# Patient Record
Sex: Male | Born: 1940 | Race: White | Hispanic: No | Marital: Married | Smoking: Former smoker
Health system: Southern US, Community
[De-identification: ages and names within clinical notes are randomized; demographics above are authoritative.]

## PROBLEM LIST (undated history)

## (undated) DIAGNOSIS — R3911 Hesitancy of micturition: Secondary | ICD-10-CM

## (undated) DIAGNOSIS — I739 Peripheral vascular disease, unspecified: Secondary | ICD-10-CM

## (undated) DIAGNOSIS — IMO0001 Reserved for inherently not codable concepts without codable children: Secondary | ICD-10-CM

## (undated) DIAGNOSIS — K635 Polyp of colon: Secondary | ICD-10-CM

## (undated) DIAGNOSIS — I779 Disorder of arteries and arterioles, unspecified: Secondary | ICD-10-CM

## (undated) DIAGNOSIS — E785 Hyperlipidemia, unspecified: Secondary | ICD-10-CM

## (undated) DIAGNOSIS — C7A8 Other malignant neuroendocrine tumors: Secondary | ICD-10-CM

## (undated) DIAGNOSIS — J449 Chronic obstructive pulmonary disease, unspecified: Secondary | ICD-10-CM

## (undated) DIAGNOSIS — R350 Frequency of micturition: Secondary | ICD-10-CM

## (undated) DIAGNOSIS — C801 Malignant (primary) neoplasm, unspecified: Secondary | ICD-10-CM

## (undated) HISTORY — DX: Reserved for inherently not codable concepts without codable children: IMO0001

## (undated) HISTORY — PX: TONSILLECTOMY: SHX5217

## (undated) HISTORY — DX: Disorder of arteries and arterioles, unspecified: I77.9

## (undated) HISTORY — PX: UPPER GASTROINTESTINAL ENDOSCOPY: SHX188

## (undated) HISTORY — DX: Polyp of colon: K63.5

## (undated) HISTORY — PX: POLYPECTOMY: SHX149

## (undated) HISTORY — PX: CATARACT EXTRACTION, BILATERAL: SHX1313

## (undated) HISTORY — DX: Peripheral vascular disease, unspecified: I73.9

## (undated) HISTORY — PX: COLONOSCOPY: SHX174

## (undated) HISTORY — DX: Other malignant neuroendocrine tumors: C7A.8

## (undated) HISTORY — PX: ESOPHAGOGASTRODUODENOSCOPY: SHX1529

## (undated) HISTORY — PX: HERNIA REPAIR: SHX51

---

## 2003-03-16 ENCOUNTER — Encounter: Admission: RE | Admit: 2003-03-16 | Discharge: 2003-03-16 | Payer: Self-pay | Admitting: Internal Medicine

## 2007-01-12 ENCOUNTER — Emergency Department (HOSPITAL_COMMUNITY): Admission: EM | Admit: 2007-01-12 | Discharge: 2007-01-12 | Payer: Self-pay | Admitting: Family Medicine

## 2010-11-29 LAB — DIFFERENTIAL
Basophils Relative: 0
Lymphocytes Relative: 16
Monocytes Relative: 9
Neutro Abs: 7.5
Neutrophils Relative %: 74

## 2010-11-29 LAB — I-STAT 8, (EC8 V) (CONVERTED LAB)
Bicarbonate: 23.3
Chloride: 108
HCT: 49
Hemoglobin: 16.7
Operator id: 265201
Sodium: 140
pCO2, Ven: 35 — ABNORMAL LOW

## 2010-11-29 LAB — CBC
MCHC: 34.6
RBC: 5.18
WBC: 10.1

## 2010-11-29 LAB — POCT I-STAT CREATININE: Creatinine, Ser: 0.8

## 2011-06-28 ENCOUNTER — Telehealth: Payer: Self-pay | Admitting: Internal Medicine

## 2011-06-28 NOTE — Telephone Encounter (Signed)
Yep. OK.

## 2011-06-28 NOTE — Telephone Encounter (Signed)
Mrs. Thone called to set up an appointment for [facility].  She thinks he saw you at [facility] many years ago.  Will this be ok?

## 2011-06-29 NOTE — Telephone Encounter (Signed)
Pt is aware and has an appointment for May 29.

## 2011-07-19 ENCOUNTER — Ambulatory Visit (INDEPENDENT_AMBULATORY_CARE_PROVIDER_SITE_OTHER): Payer: Medicare Other | Admitting: Internal Medicine

## 2011-07-19 ENCOUNTER — Other Ambulatory Visit (INDEPENDENT_AMBULATORY_CARE_PROVIDER_SITE_OTHER): Payer: Medicare Other

## 2011-07-19 ENCOUNTER — Encounter: Payer: Self-pay | Admitting: Internal Medicine

## 2011-07-19 VITALS — BP 124/70 | HR 60 | Temp 97.5°F | Resp 16 | Wt 137.0 lb

## 2011-07-19 DIAGNOSIS — R0602 Shortness of breath: Secondary | ICD-10-CM

## 2011-07-19 DIAGNOSIS — Z0000 Encounter for general adult medical examination without abnormal findings: Secondary | ICD-10-CM

## 2011-07-19 DIAGNOSIS — Z87891 Personal history of nicotine dependence: Secondary | ICD-10-CM

## 2011-07-19 DIAGNOSIS — Z23 Encounter for immunization: Secondary | ICD-10-CM

## 2011-07-19 LAB — CBC WITH DIFFERENTIAL/PLATELET
Eosinophils Absolute: 0.1 10*3/uL (ref 0.0–0.7)
MCHC: 33.6 g/dL (ref 30.0–36.0)
MCV: 88.6 fl (ref 78.0–100.0)
Monocytes Absolute: 0.6 10*3/uL (ref 0.1–1.0)
Neutrophils Relative %: 75.1 % (ref 43.0–77.0)
Platelets: 236 10*3/uL (ref 150.0–400.0)
WBC: 9.9 10*3/uL (ref 4.5–10.5)

## 2011-07-19 LAB — COMPREHENSIVE METABOLIC PANEL
BUN: 8 mg/dL (ref 6–23)
CO2: 29 mEq/L (ref 19–32)
Calcium: 9.1 mg/dL (ref 8.4–10.5)
Chloride: 105 mEq/L (ref 96–112)
Creatinine, Ser: 0.8 mg/dL (ref 0.4–1.5)
GFR: 103.94 mL/min (ref >60.00)

## 2011-07-19 LAB — HEPATIC FUNCTION PANEL
Bilirubin, Direct: 0.1 mg/dL (ref 0.0–0.3)
Total Bilirubin: 0.6 mg/dL (ref 0.3–1.2)
Total Protein: 7.2 g/dL (ref 6.0–8.3)

## 2011-07-19 LAB — LIPID PANEL
Cholesterol: 187 mg/dL (ref 0–200)
Triglycerides: 138 mg/dL (ref 0.0–149.0)

## 2011-07-19 NOTE — Patient Instructions (Signed)
Thanks for coming to see me.  Will check routine labs today. Will get a non-contrast CT scan of the chest to see if there is emphysema ( very likely) or anything else.

## 2011-07-19 NOTE — Progress Notes (Signed)
Subjective:    Patient ID: Joseph Parker, male    DOB: 10/19/1940, 70 y.o.   MRN: [id number]  HPI Joseph Parker has not been seen since 2005 - old note pulled from archives for review. He presents at the urging of his family: he has had thinning about the head and neck, he has increased SOB, decreased stamina. He will have a cough every AM and when he rises from supine to sitting or standing. No night sweats or hard chills, no other signs of illness.   Past Medical History  Diagnosis Date  . Chickenpox   . Colon polyps    Past Surgical History  Procedure Date  . Tonsillectomy     remote - childhood  . Hernia repair     right inquinal - as child   Family History  Problem Relation Age of Onset  . Heart disease Mother     CABG; Valve replacement - died post-op  . Alzheimer's disease Father   . Diabetes Father   . Diabetes Son    History   Social History  . Marital Status: Married    Spouse Name: N/A    Number of Children: 5  . Years of Education: 12   Occupational History  . mfg supervisor     retired   Social History Main Topics  . Smoking status: Former Smoker    Quit date: 07/20/1999  . Smokeless tobacco: Never Used  . Alcohol Use: No  . Drug Use: No  . Sexually Active: Not on file   Other Topics Concern  . Not on file   Social History Narrative   HSG. Married '62. Previous marriage - 3 years/divorce. 5 sons. 6 grandchildren. Work - Proofreader - retired. Active in community and in his church       Review of Systems Constitutional:  Negative for fever, chills, activity change and unexpected weight change.  HEENT:  Negative for hearing loss, ear pain, congestion, neck stiffness and postnasal drip. Negative for sore throat or swallowing problems. Negative for dental complaints.   Eyes: Negative for vision loss or change in visual acuity.  Respiratory: Negative for chest tightness and wheezing. Negative for DOE.   Cardiovascular: Negative for chest  pain or palpitations. No decreased exercise tolerance Gastrointestinal: No change in bowel habit. No bloating or gas. No reflux or indigestion Genitourinary: Negative for urgency, frequency, flank pain and difficulty urinating.  Musculoskeletal: Negative for myalgias, back pain, arthralgias and gait problem.  Neurological: Negative for dizziness, tremors, weakness and headaches.  Hematological: Negative for adenopathy.  Psychiatric/Behavioral: Negative for behavioral problems and dysphoric mood.       Objective:   Physical Exam Filed Vitals:   07/19/11 1003  BP: 124/70  Pulse: 60  Temp: 97.5 F (36.4 C)  Resp: 16  Weight: 137 lb (62.143 kg)   Gen'l- thin white man in no distress HEENT- temporal wasting, C&S clear, PERRLA, edentulous with dentures in place, throat clear Neck- supple, w/o thyromegaly Nodes - negative submandibular, cervical or supraclavicular region Cor- RRR Pulm - normal respirations, lungs clear to AP, no rales or wheezes Abd- scaphoid, BS +, no HSM Genitalia - deferred Neuro - A&O x 3, CN II-XII normal, MS - normal Derm - no visible skin lesions.  Lab Results  Component Value Date   WBC 9.9 07/19/2011   HGB 14.9 07/19/2011   HCT 44.3 07/19/2011   PLT 236.0 07/19/2011   GLUCOSE 100* 07/19/2011   CHOL 187 07/19/2011   TRIG  138.0 07/19/2011   HDL 39.00* 07/19/2011   LDLCALC 120* 07/19/2011        ALT 11 07/19/2011   AST 14 07/19/2011        NA 141 07/19/2011   K 4.3 07/19/2011   CL 105 07/19/2011   CREATININE 0.8 07/19/2011   BUN 8 07/19/2011   CO2 29 07/19/2011   TSH 1.45 07/19/2011          Assessment & Plan:

## 2011-07-20 ENCOUNTER — Encounter: Payer: Self-pay | Admitting: Internal Medicine

## 2011-07-20 ENCOUNTER — Telehealth: Payer: Self-pay

## 2011-07-20 DIAGNOSIS — J439 Emphysema, unspecified: Secondary | ICD-10-CM | POA: Insufficient documentation

## 2011-07-20 DIAGNOSIS — Z0000 Encounter for general adult medical examination without abnormal findings: Secondary | ICD-10-CM | POA: Insufficient documentation

## 2011-07-20 NOTE — Assessment & Plan Note (Signed)
Interval medical history from '05 - benign. Physical exam is normal except for thinning. Lab results are in normal limits. Immunizations are brought up to date. He has had colorectal cancer screening.  In summary - a nice man who is generally healthy now with  Mild pulmonary symptoms. He is active, mindful of his diet and health conscious.

## 2011-07-20 NOTE — Assessment & Plan Note (Signed)
Mr. Joseph Parker with mild weight loss of about 5 lbs but noticeable thinning about the head, neck and upper chest.  He has mild SOB and mild decreased exercise tolerance. 45 pack year smoking history. At risk for pulmonary malignancy, also at risk for emphysema.  Plan-    Low dose CT chest w/o contrast to r/o lung cancer in a former smoker  May need to PFTs for better diagnosis if CT negative.

## 2011-07-20 NOTE — Telephone Encounter (Signed)
Pt's spouse called requesting results of labs done yesterday, please advise.

## 2011-07-21 NOTE — Telephone Encounter (Signed)
Labs were all normal. Full report will be mailed

## 2011-07-21 NOTE — Telephone Encounter (Signed)
Pt's spouse informed 

## 2011-07-21 NOTE — Telephone Encounter (Signed)
Left message on machine for pt to return my call  

## 2011-07-31 ENCOUNTER — Telehealth: Payer: Self-pay | Admitting: *Deleted

## 2011-07-31 NOTE — Telephone Encounter (Signed)
Pt's spouse called left message asking for return call-pt's spouse is inquiring on status of CT scan-she has received approval for CT in the [facility] to [facility] for information.

## 2011-08-03 ENCOUNTER — Other Ambulatory Visit: Payer: Medicare Other

## 2011-08-03 ENCOUNTER — Ambulatory Visit (INDEPENDENT_AMBULATORY_CARE_PROVIDER_SITE_OTHER)
Admission: RE | Admit: 2011-08-03 | Discharge: 2011-08-03 | Disposition: A | Discharge status: Home / Self Care | Payer: Medicare Other | Source: Ambulatory Visit | Attending: Internal Medicine | Admitting: Internal Medicine

## 2011-08-03 DIAGNOSIS — Z87891 Personal history of nicotine dependence: Secondary | ICD-10-CM

## 2011-08-03 DIAGNOSIS — R0602 Shortness of breath: Secondary | ICD-10-CM

## 2011-08-04 ENCOUNTER — Telehealth: Payer: Self-pay | Admitting: *Deleted

## 2011-08-04 ENCOUNTER — Telehealth: Payer: Self-pay | Admitting: Internal Medicine

## 2011-08-04 NOTE — Telephone Encounter (Signed)
Caller: Judy/Spouse; PCP: Illene Regulus; CB#: [phone];  Call regarding Awaiting Results of CT Scan of Chest that was done on 08/03/11. Left two messages today and has not recieved call back. Advised that Dr. Debby Bud is not in office today and he would have called if anything Critical. PLEASE CALL ON MONDAY 08/07/11.

## 2011-08-04 NOTE — Telephone Encounter (Signed)
Left msg on triage requesting CT results from yesterday. MD is out of office. Pls advise... 08/04/11@9 :12am/LMB

## 2011-08-04 NOTE — Telephone Encounter (Signed)
Will forward this to MEN 

## 2011-08-05 ENCOUNTER — Other Ambulatory Visit: Payer: Self-pay | Admitting: Internal Medicine

## 2011-08-05 DIAGNOSIS — R911 Solitary pulmonary nodule: Secondary | ICD-10-CM

## 2011-08-05 NOTE — Telephone Encounter (Signed)
Called and spoke with mrs. Pitstick. Will call back this afternoon to talk with Mr. Umbach

## 2011-08-14 ENCOUNTER — Encounter (HOSPITAL_COMMUNITY)
Admission: RE | Admit: 2011-08-14 | Discharge: 2011-08-14 | Disposition: A | Discharge status: Home / Self Care | Payer: Medicare Other | Source: Ambulatory Visit | Attending: Internal Medicine | Admitting: Internal Medicine

## 2011-08-14 DIAGNOSIS — R911 Solitary pulmonary nodule: Secondary | ICD-10-CM | POA: Insufficient documentation

## 2011-08-14 DIAGNOSIS — N200 Calculus of kidney: Secondary | ICD-10-CM | POA: Insufficient documentation

## 2011-08-14 DIAGNOSIS — J438 Other emphysema: Secondary | ICD-10-CM | POA: Insufficient documentation

## 2011-08-14 DIAGNOSIS — N400 Enlarged prostate without lower urinary tract symptoms: Secondary | ICD-10-CM | POA: Insufficient documentation

## 2011-08-14 MED ORDER — FLUDEOXYGLUCOSE F - 18 (FDG) INJECTION
19.3000 | Freq: Once | INTRAVENOUS | Status: AC | PRN
  Administered 2011-08-14: 19.3 via INTRAVENOUS

## 2011-08-15 ENCOUNTER — Telehealth: Payer: Self-pay

## 2011-08-15 NOTE — Telephone Encounter (Signed)
Nodule does NOT show any hypermetabolic uptake - does not look malignant but the caveat is that it is small and it is possible to be too early to diagnose. Will need f/u CT in 3-4 months

## 2011-08-15 NOTE — Telephone Encounter (Signed)
Spouse called requesting PET scan results, please advise.

## 2011-08-16 NOTE — Telephone Encounter (Signed)
Results of PET scan results given to spouse,Judy,  Informed of need to have CT  In 3 - 4 months

## 2011-08-18 ENCOUNTER — Telehealth: Payer: Self-pay

## 2011-08-18 DIAGNOSIS — R0602 Shortness of breath: Secondary | ICD-10-CM

## 2011-08-18 NOTE — Telephone Encounter (Signed)
Pt's spouse called requesting advisement from MEN regarding pt's persistent cough, please advise.

## 2011-08-21 MED ORDER — BENZONATATE 100 MG PO CAPS: 100.0000 mg | ORAL_CAPSULE | Freq: Three times a day (TID) | ORAL | Status: AC | PRN

## 2011-08-21 NOTE — Telephone Encounter (Signed)
Pt's spouse advised of OTC medication, Rx/pharmacy as well as PFT testing.

## 2011-08-21 NOTE — Telephone Encounter (Signed)
Will schedule for ;pulmonary function testing  For cough: take otc Prilosec at AM. Reflux can be a cause of cough. Trial of tessalon perles tid - Rx sent in.

## 2011-10-25 ENCOUNTER — Encounter: Payer: Self-pay | Admitting: Internal Medicine

## 2011-10-25 ENCOUNTER — Ambulatory Visit (INDEPENDENT_AMBULATORY_CARE_PROVIDER_SITE_OTHER): Payer: Medicare Other | Admitting: Internal Medicine

## 2011-10-25 VITALS — BP 112/72 | HR 66 | Temp 97.3°F | Resp 16 | Wt 134.0 lb

## 2011-10-25 DIAGNOSIS — J438 Other emphysema: Secondary | ICD-10-CM

## 2011-10-25 DIAGNOSIS — R911 Solitary pulmonary nodule: Secondary | ICD-10-CM

## 2011-10-25 DIAGNOSIS — J439 Emphysema, unspecified: Secondary | ICD-10-CM

## 2011-10-25 NOTE — Patient Instructions (Addendum)
Lung nodule - this was a spiculated but very small nodule right posterior lung that was negative on PET scan. Plan is for a follow-up CT scan.  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 In order to establish a  Baseline and to better know the extent of the problem I do recommend you have pulmonary function studies.   Chronic Obstructive Pulmonary Disease Exacerbation  Chronic obstructive pulmonary disease (COPD) is a lung disease. The lungs become damaged, making it hard to get air in and out of your lungs. COPD exacerbation means that your COPD has gotten worse. If you do not get help, this can be a life-threatening problem. HOME CARE  Do not smoke.   Avoid tobacco smoke and other things that bother your lungs.   If given, take your antibiotic medicine as told. Finish the medicine even if you start to feel better.   Only take medicines as told by your doctor.   Drink enough fluids to keep your pee (urine) clear or pale yellow.   Use a cool mist machine (vaporizer).   If you use oxygen or a machine that turns liquid medicine into a mist (nebulizer), continue to use them as told.   Keep up with shots (vaccinations) as told by your doctor.   Exercise regularly.   Eat healthy foods.   Keep all doctor visits as told.  GET HELP RIGHT AWAY IF:  You are very short of breath.   You have trouble talking.   You have bad chest pain.   You have blood in your spit (sputum).   You have a fever, or you keep throwing up (vomiting).   You feel weak, or you pass out (faint).   You feel confused.   You keep getting worse.  MAKE SURE YOU:    Understand these instructions.   Will watch your condition.   Will get help right away if you are not doing well or get worse.  Document Released: 01/26/2011 Document Reviewed: 01/24/2011 [facility] Patient Information 2012 [place], [place].   Chronic Obstructive Pulmonary Disease Exacerbation Chronic  obstructive pulmonary disease (COPD) is a condition that limits airflow. COPD may include chronic bronchitis, pulmonary emphysema, or both. A COPD exacerbation means that your COPD has gotten worse. Without treatment, this can be a life-threatening problem. COPD exacerbation requires immediate medical care. CAUSES   COPD exacerbation can be caused by:  Exposure to smoke.   Exposure to air pollution, chemical fumes, or dust.   Respiratory infections.   Genetics, particularly alpha 1-antitrypsin deficiency.   A condition in which the body's immune system attacks itself (autoimmunity).  SYMPTOMS    Increased coughing.   Increased wheezing.   Increased shortness of breath.   Swelling due to a buildup of fluid (peripheral edema) related to heart strain.   Rapid breathing.   Chest enlargement (barrel chest).   Chest tightness.  DIAGNOSIS   There is no single test that can diagnosis COPD exacerbation. Your history, physical exam, and other tests will help your caregiver make a diagnosis. Tests may include a chest X-ray, pulmonary function tests, spirometry, basic lab tests, and an arterial blood gas test. TREATMENT   Severe problems may require a stay in the hospital. Depending on the cause of your problems, the following may be prescribed:  Antibiotic medicines.   Bronchodilators (inhaled or tablets).   Cortisone medicines (inhaled or tablets).   Supplemental oxygen therapy.   Pulmonary rehabilitation. This is a broad program that  may involve exercise, nutrition counseling, breathing techniques, and further education about your condition.  It is important to use good technique with inhaled medicines. Spacer devices may be needed to help improve drug delivery. HOME CARE INSTRUCTIONS    Do not smoke. Quitting smoking is very important to prevent worsening of COPD.   Avoid exposure to all substances that irritate the airway, especially tobacco smoke.   If prescribed, take your  antibiotics as directed. Finish them even if you start to feel better.   Only take over-the-counter or prescription medicines as directed by your caregiver.   Drink enough fluids to keep your urine clear or pale yellow. This can help thin bronchial secretions.   Use a cool mist vaporizer. This makes it easier to clear your chest when you cough.   If you have a home nebulizer and oxygen, continue to use them as directed.   Maintain all necessary vaccinations to prevent infections.   Exercise regularly.   Eat a healthy diet.   Keep all follow-up appointments as directed by your caregiver.  SEEK IMMEDIATE MEDICAL CARE IF:  You have extreme shortness of breath.   You have severe chest pain or blood in your sputum.   You have a high fever, weakness, repeated vomiting, or fainting.   You feel confused.  MAKE SURE YOU:    Understand these instructions.   Will watch your condition.   Will get help right away if you are not doing well or get worse.  Document Released: 12/04/2006 Document Revised: 01/26/2011 Document Reviewed: 10/04/2010 [facility] Patient Information 2012 [place], [place].

## 2011-10-27 NOTE — Progress Notes (Signed)
  Subjective:    Patient ID: Joseph Parker, male    DOB: January 09, 1941, 71 y.o.   MRN: [id number]  HPI Joseph Parker presents for follow up of a pulmonary nodule seen on CT chest: small spiculated lesion posterior RLL. Study also revealed emphysematous changes. In the interval he has been doing well. He denies any pulmonary symptoms, i.e. SOB, DOE. He has had no night sweats, weight loss, or lymphadenopathy. He did not have PFTs as ordered.  Past Medical History  Diagnosis Date  . Chickenpox   . Colon polyps    Past Surgical History  Procedure Date  . Tonsillectomy     remote - childhood  . Hernia repair     right inquinal - as child   Family History  Problem Relation Age of Onset  . Heart disease Mother     CABG; Valve replacement - died post-op  . Alzheimer's disease Father   . Diabetes Father   . Diabetes Son    History   Social History  . Marital Status: Married    Spouse Name: N/A    Number of Children: 5  . Years of Education: 12   Occupational History  . mfg supervisor     retired   Social History Main Topics  . Smoking status: Former Smoker    Quit date: 07/20/1999  . Smokeless tobacco: Never Used  . Alcohol Use: No  . Drug Use: No  . Sexually Active: Not on file   Other Topics Concern  . Not on file   Social History Narrative   HSG. Married '62. Previous marriage - 3 years/divorce. 5 sons. 6 grandchildren. Work - Proofreader - retired. Active in community and in his church    Current Outpatient Prescriptions on File Prior to Visit  Medication Sig Dispense Refill  . Omeprazole Magnesium (PRILOSEC OTC PO) Take 1 tablet by mouth daily.          Review of Systems System review is negative for any constitutional, cardiac, pulmonary, GI or neuro symptoms or complaints other than as described in the HPI.     Objective:   Physical Exam Filed Vitals:   10/25/11 1116  BP: 112/72  Pulse: 66  Temp: 97.3 F (36.3 C)  Resp: 16   Wt Readings from  Last 3 Encounters:  10/25/11 134 lb (60.782 kg)  07/19/11 137 lb (62.143 kg)   Gen'l- slender, tanned healthy appearing white man in no distress Pulm - normal respirations.       Assessment & Plan:

## 2011-10-27 NOTE — Assessment & Plan Note (Signed)
Reviewed CT chest report and images with the patient. Educated him as to the mechanism of disease - emphysema. Stressed the importance of establishing a baseline of function, i.e. PFTs, even though he is asymptomatic by his report.  Plan Rescheduled PFTs  (greater than 50% of 60 minute visit spent on education and counseling)

## 2011-10-27 NOTE — Assessment & Plan Note (Signed)
Joseph Parker has been asymptomatic.  Plan - f/u CT chest w/o contrast.

## 2011-10-30 ENCOUNTER — Other Ambulatory Visit: Payer: Medicare Other

## 2011-10-31 ENCOUNTER — Ambulatory Visit (INDEPENDENT_AMBULATORY_CARE_PROVIDER_SITE_OTHER)
Admission: RE | Admit: 2011-10-31 | Discharge: 2011-10-31 | Disposition: A | Discharge status: Home / Self Care | Payer: Medicare Other | Source: Ambulatory Visit | Attending: Internal Medicine | Admitting: Internal Medicine

## 2011-10-31 DIAGNOSIS — R911 Solitary pulmonary nodule: Secondary | ICD-10-CM

## 2011-11-02 ENCOUNTER — Ambulatory Visit (INDEPENDENT_AMBULATORY_CARE_PROVIDER_SITE_OTHER): Payer: Medicare Other | Admitting: Internal Medicine

## 2011-11-02 DIAGNOSIS — R0602 Shortness of breath: Secondary | ICD-10-CM

## 2011-11-02 LAB — PULMONARY FUNCTION TEST

## 2011-11-02 NOTE — Progress Notes (Signed)
PFT done today. 

## 2011-11-05 ENCOUNTER — Encounter: Payer: Self-pay | Admitting: Internal Medicine

## 2011-11-05 DIAGNOSIS — R911 Solitary pulmonary nodule: Secondary | ICD-10-CM

## 2011-11-06 ENCOUNTER — Telehealth: Payer: Self-pay | Admitting: Internal Medicine

## 2011-11-06 NOTE — Telephone Encounter (Signed)
Patients wife is calling to speak with Dr. Alvera Novel assistant about her husbands CT results

## 2011-11-07 NOTE — Telephone Encounter (Signed)
Called - negative study

## 2011-11-10 ENCOUNTER — Encounter: Payer: Self-pay | Admitting: Internal Medicine

## 2012-04-23 ENCOUNTER — Other Ambulatory Visit: Payer: Self-pay | Admitting: Internal Medicine

## 2012-04-26 ENCOUNTER — Other Ambulatory Visit: Payer: Medicare Other

## 2012-05-02 ENCOUNTER — Ambulatory Visit (INDEPENDENT_AMBULATORY_CARE_PROVIDER_SITE_OTHER)
Admission: RE | Admit: 2012-05-02 | Discharge: 2012-05-02 | Disposition: A | Discharge status: Home / Self Care | Payer: Medicare Other | Source: Ambulatory Visit | Attending: Internal Medicine | Admitting: Internal Medicine

## 2012-05-02 DIAGNOSIS — R911 Solitary pulmonary nodule: Secondary | ICD-10-CM

## 2012-05-06 ENCOUNTER — Encounter: Payer: Self-pay | Admitting: Internal Medicine

## 2013-03-17 ENCOUNTER — Ambulatory Visit (INDEPENDENT_AMBULATORY_CARE_PROVIDER_SITE_OTHER): Payer: Medicare Other | Admitting: Internal Medicine

## 2013-03-17 ENCOUNTER — Encounter: Payer: Self-pay | Admitting: Internal Medicine

## 2013-03-17 VITALS — BP 100/68 | HR 54 | Temp 96.8°F | Wt 130.0 lb

## 2013-03-17 DIAGNOSIS — R911 Solitary pulmonary nodule: Secondary | ICD-10-CM

## 2013-03-17 DIAGNOSIS — J439 Emphysema, unspecified: Secondary | ICD-10-CM

## 2013-03-17 DIAGNOSIS — J438 Other emphysema: Secondary | ICD-10-CM

## 2013-03-17 NOTE — Progress Notes (Signed)
Pre visit review using our clinic review tool, if applicable. No additional management support is needed unless otherwise documented below in the visit note. 

## 2013-03-17 NOTE — Patient Instructions (Signed)
Thanks for coming to see me and it has been a real pleasure being your doctor.  On exam there is no evidence of active infection or disease.  Reviewing you imaging studies and the pulmonary function studies you definitely have COPD. On imaging you have changes of emphysema  Plan Follow up CT of the chest to check on the nodules.

## 2013-03-17 NOTE — Progress Notes (Signed)
   Subjective:    Patient ID: Joseph Parker, male    DOB: Jul 31, 1940, 73 y.o.   MRN: [id number]  HPI Joseph Parker presents for h/o over the past two months of a chronic loose cough productive of a clear sputum. He has had no fever or chills, he denies any increase in shortness breath. He does have a h/o emphysema/COPD by CT chest and x-ray and COPD severe by PFTs. He does have a smoking history. He also has a h/o pulmonary nodules that have been followed by CT chest, last study March '14 with recommendation for follow up in Spring 2015.   Joseph Parker feels that his cough is getting better. He has no sputum production, no increased DOE/SOB,no fever, chills, night sweats or weight loss.  Past Medical History  Diagnosis Date  . Chickenpox   . Colon polyps    Past Surgical History  Procedure Laterality Date  . Tonsillectomy      remote - childhood  . Hernia repair      right inquinal - as child   Family History  Problem Relation Age of Onset  . Heart disease Mother     CABG; Valve replacement - died post-op  . Alzheimer's disease Father   . Diabetes Father   . Diabetes Son    History   Social History  . Marital Status: Married    Spouse Name: N/A    Number of Children: 5  . Years of Education: 12   Occupational History  . mfg supervisor     retired   Social History Main Topics  . Smoking status: Former Smoker    Quit date: 07/20/1999  . Smokeless tobacco: Never Used  . Alcohol Use: No  . Drug Use: No  . Sexual Activity: Not on file   Other Topics Concern  . Not on file   Social History Narrative   HSG. Married '62. Previous marriage - 3 years/divorce. 5 sons. 6 grandchildren. Work - Financial planner - retired. Active in community and in his church    No current outpatient prescriptions on file prior to visit.   No current facility-administered medications on file prior to visit.      Review of Systems System review is negative for any constitutional,  cardiac, pulmonary, GI or neuro symptoms or complaints other than as described in the HPI.     Objective:   Physical Exam Filed Vitals:   03/17/13 1136  BP: 100/68  Pulse: 54  Temp: 96.8 F (36 C)   Wt Readings from Last 3 Encounters:  03/17/13 130 lb (58.968 kg)  10/25/11 134 lb (60.782 kg)  07/19/11 137 lb (62.143 kg)   Gen'l- thin man with temporal wasting HEENT- [place]/AT, temporal wasting noted, C&S clear PERRLA Neck -supple Nodes - supraclavicular region clear Cor 2+ radial RRR Pulm - no increased WOB, Lungs decreased breath sounds, no rales or wheezing appreciated.        Assessment & Plan:

## 2013-03-18 ENCOUNTER — Telehealth: Payer: Self-pay | Admitting: Internal Medicine

## 2013-03-18 NOTE — Telephone Encounter (Signed)
Relevant patient education mailed to patient.  

## 2013-03-18 NOTE — Assessment & Plan Note (Signed)
H/o of pulmonary nodules. He is approaching time for repeat CT chest.  Plan CT chest for f/u pulmonary nodules ordered.

## 2013-03-18 NOTE — Assessment & Plan Note (Signed)
Reviewed imaging reports, reviewed PFTs -c/e severe COPD, explained disease process for both emphysema and COPD including cartoon. He does not appear to have an acute illness. He had no meaningful response to bronchodilators at PFTs.  Plan No new treatments or inteventions

## 2013-03-21 ENCOUNTER — Other Ambulatory Visit: Payer: Medicare Other

## 2013-03-26 ENCOUNTER — Inpatient Hospital Stay: Admission: RE | Admit: 2013-03-26 | Payer: Medicare Other | Source: Ambulatory Visit

## 2013-04-01 ENCOUNTER — Ambulatory Visit (INDEPENDENT_AMBULATORY_CARE_PROVIDER_SITE_OTHER)
Admission: RE | Admit: 2013-04-01 | Discharge: 2013-04-01 | Disposition: A | Discharge status: Home / Self Care | Payer: Medicare Other | Source: Ambulatory Visit | Attending: Internal Medicine | Admitting: Internal Medicine

## 2013-04-01 DIAGNOSIS — R911 Solitary pulmonary nodule: Secondary | ICD-10-CM

## 2013-04-02 ENCOUNTER — Telehealth: Payer: Self-pay

## 2013-04-02 NOTE — Telephone Encounter (Signed)
I spoke to patient's wife and let her know his his CT was fine per Dr Linda Hedges

## 2013-06-10 ENCOUNTER — Encounter: Payer: Self-pay | Admitting: Family Medicine

## 2013-06-10 ENCOUNTER — Ambulatory Visit (INDEPENDENT_AMBULATORY_CARE_PROVIDER_SITE_OTHER): Payer: Medicare Other | Admitting: Family Medicine

## 2013-06-10 VITALS — BP 132/66 | HR 68 | Temp 97.5°F | Wt 125.8 lb

## 2013-06-10 DIAGNOSIS — R002 Palpitations: Secondary | ICD-10-CM

## 2013-06-10 DIAGNOSIS — R6889 Other general symptoms and signs: Secondary | ICD-10-CM

## 2013-06-10 LAB — CBC WITH DIFFERENTIAL/PLATELET
BASOS PCT: 0.2 % (ref 0.0–3.0)
Basophils Absolute: 0 10*3/uL (ref 0.0–0.1)
EOS ABS: 0.1 10*3/uL (ref 0.0–0.7)
Eosinophils Relative: 0.8 % (ref 0.0–5.0)
HEMATOCRIT: 42.9 % (ref 39.0–52.0)
Hemoglobin: 14.5 g/dL (ref 13.0–17.0)
LYMPHS ABS: 1.6 10*3/uL (ref 0.7–4.0)
Lymphocytes Relative: 16.9 % (ref 12.0–46.0)
MCHC: 33.9 g/dL (ref 30.0–36.0)
MCV: 88.1 fl (ref 78.0–100.0)
MONO ABS: 0.6 10*3/uL (ref 0.1–1.0)
Monocytes Relative: 7 % (ref 3.0–12.0)
Neutro Abs: 6.9 10*3/uL (ref 1.4–7.7)
Neutrophils Relative %: 75.1 % (ref 43.0–77.0)
PLATELETS: 263 10*3/uL (ref 150.0–400.0)
RBC: 4.87 Mil/uL (ref 4.22–5.81)
RDW: 13.9 % (ref 11.5–14.6)
WBC: 9.2 10*3/uL (ref 4.5–10.5)

## 2013-06-10 LAB — COMPREHENSIVE METABOLIC PANEL
ALT: 11 U/L (ref 0–53)
AST: 14 U/L (ref 0–37)
Albumin: 4.2 g/dL (ref 3.5–5.2)
Alkaline Phosphatase: 60 U/L (ref 39–117)
BUN: 12 mg/dL (ref 6–23)
CO2: 28 meq/L (ref 19–32)
CREATININE: 0.8 mg/dL (ref 0.4–1.5)
Calcium: 9.3 mg/dL (ref 8.4–10.5)
Chloride: 102 mEq/L (ref 96–112)
GFR: 106.54 mL/min (ref >60.00)
Glucose, Bld: 97 mg/dL (ref 70–99)
POTASSIUM: 4 meq/L (ref 3.5–5.1)
Sodium: 140 mEq/L (ref 135–145)
Total Bilirubin: 0.9 mg/dL (ref 0.3–1.2)
Total Protein: 7.3 g/dL (ref 6.0–8.3)

## 2013-06-10 LAB — TSH: TSH: 0.94 u[IU]/mL (ref 0.35–5.50)

## 2013-06-10 NOTE — Patient Instructions (Signed)
Limit exertion in the meantime.   Joseph Parker will call about your referral. We'll contact you with your lab report. If you have chest pain in the meantime, then go to the ER.  Take care.

## 2013-06-10 NOTE — Progress Notes (Signed)
Pre visit review using our clinic review tool, if applicable. No additional management support is needed unless otherwise documented below in the visit note.  Last few weeks he had dec in exercise tolerance. "I would get tired when I would go out messing around."  That predates the other sx:  Recently with heart fluttering, on Friday night. This was after he had gotten upset.  Fluttering happened again on Saturday.  No other episodes in the meantime.  But he had some L shoulder and chest wall pain in the interval.  It is resolved in the meantime.  His sleep has been disrupted in the meantime, since he got upset.  No syncope.  No exertional chest pain.  H/o CT chest with calcified coronary arteries.  Appetite is decreased in the meantime.  No nausea.  No abd pain, no bloating.  Long term smoker. Some cough that is ongoing.    Meds, vitals, and allergies reviewed.   ROS: See HPI.  Otherwise, noncontributory.  GEN: nad, alert and oriented, thin HEENT: mucous membranes moist NECK: supple w/o LA CV: mild brady but rrr o/w.  PULM: ctab, no inc wob ABD: soft, +bs EXT: no edema SKIN: no acute rash

## 2013-06-10 NOTE — Assessment & Plan Note (Signed)
This is relatively new and predates other sx.  This, along with the bradycardia and CT documented coronary calcifications, is more concerning to me than the palpitations.  The palpitations could be anxiety related, along with the dec in appeitite.  I want cards input on both the dec in exercise tolerance and the palpitations.  Pt agrees.  See notes on labs.  >25 minutes spent in face to face time with patient, >50% spent in counselling or coordination of care.

## 2013-06-11 ENCOUNTER — Encounter: Payer: Self-pay | Admitting: Interventional Cardiology

## 2013-06-11 ENCOUNTER — Ambulatory Visit (INDEPENDENT_AMBULATORY_CARE_PROVIDER_SITE_OTHER): Payer: Medicare Other | Admitting: Interventional Cardiology

## 2013-06-11 VITALS — BP 140/82 | HR 60 | Ht 67.5 in | Wt 127.0 lb

## 2013-06-11 DIAGNOSIS — J439 Emphysema, unspecified: Secondary | ICD-10-CM

## 2013-06-11 DIAGNOSIS — I495 Sick sinus syndrome: Secondary | ICD-10-CM

## 2013-06-11 DIAGNOSIS — R6889 Other general symptoms and signs: Secondary | ICD-10-CM

## 2013-06-11 DIAGNOSIS — I2510 Atherosclerotic heart disease of native coronary artery without angina pectoris: Secondary | ICD-10-CM

## 2013-06-11 DIAGNOSIS — R0989 Other specified symptoms and signs involving the circulatory and respiratory systems: Secondary | ICD-10-CM

## 2013-06-11 DIAGNOSIS — J438 Other emphysema: Secondary | ICD-10-CM

## 2013-06-11 MED ORDER — ASPIRIN EC 81 MG PO TBEC: 81.0000 mg | DELAYED_RELEASE_TABLET | Freq: Every day | ORAL | Status: AC

## 2013-06-11 NOTE — Progress Notes (Signed)
Patient ID: Joseph Parker, male   DOB: [birth year], 73 y.o.   MRN: [id number]   Date: 06/11/2013 ID: Joseph Parker, DOB 12/29/1940, MRN [id number] PCP: Adella Hare, MD  Reason: Exertional fatigue  ASSESSMENT;  1. Exertional fatigue, rule out PVD with claudication versus chronotropic incompetence and low cardiac output 2. Severe resting bradycardia on no medication to slow heart rate 3. Coronary atherosclerosis been noted by coronary calcification on CT scan, 2015 4. Peripheral vascular disease with bilateral carotid bruits and bilateral femoral bruits. Exertional lower extremity heaviness and fatigue could be related to this problem  PLAN:  1. Exercise treadmill test to rule out chronotropic incompetence and myocardial ischemia 2. Bilateral carotid Doppler 3. Consider bilateral lower extremity Doppler evaluation if there is significant leg fatigue or objective evidence of claudication on exercise treadmill test 4. Baby aspirin 81 mg per day 5. Clinical followup to be pending results of testing   SUBJECTIVE: Joseph Parker is a 73 y.o. male who is referred by Dr. Renford Dills for evaluation of exertional fatigue. The patient describes this as if his legs are very heavy. There is no pain. He does note that the lower extremity stay: All the time. He additionally has noted difficulty sleeping, poor appetite, and mild weight loss. He has not had chest pain. There is moderate dyspnea on exertion. There is no history of heart disease, diabetes, hyperlipidemia, or medication therapy to   Allergies  Allergen Reactions  . Penicillins Itching, Swelling and Rash  . Tessalon [Benzonatate]     Had wide-spread numbness    No current outpatient prescriptions on file prior to visit.   No current facility-administered medications on file prior to visit.    Past Medical History  Diagnosis Date  . Chickenpox   . Colon polyps     Past Surgical History  Procedure Laterality Date  .  Tonsillectomy      remote - childhood  . Hernia repair      right inquinal - as child    History   Social History  . Marital Status: Married    Spouse Name: N/A    Number of Children: 5  . Years of Education: 12   Occupational History  . mfg supervisor     retired   Social History Main Topics  . Smoking status: Former Smoker    Quit date: 07/20/1999  . Smokeless tobacco: Never Used  . Alcohol Use: No  . Drug Use: No  . Sexual Activity: Not on file   Other Topics Concern  . Not on file   Social History Narrative   HSG. Married '63. Previous marriage - 3 years/divorce. 5 sons. 6 grandchildren. Work - Financial planner - retired. Active in community and in his church   [employer] fan          Family History  Problem Relation Age of Onset  . Heart disease Mother     CABG; Valve replacement - died post-op  . Alzheimer's disease Father   . Diabetes Father   . Diabetes Son     ROS: Poor appetite, decreased energy, insomnia, denies cough. Has history of lung nodule is being followed by serial CT scans. No episodes of syncope. Occasional palpitations. No transient neurological complaints. Other systems negative for complaints.  OBJECTIVE: BP 140/82  Pulse 60  Ht 5' 7.5" (1.715 m)  Wt 127 lb (57.607 kg)  BMI 19.59 kg/m2,  General: No acute distress, slender chronically ill appearing HEENT: normal no pallor or  jaundice Neck: JVD flat. Carotids bilateral carotid bruits left greater than right Chest: Clear Cardiac: Murmur: 2 of 6 right upper sternal border systolic murmur. Gallop: S4. Rhythm: Normal a bradycardic. Other: Normal Abdomen: Bruit: Faint in the periumbilical area. Pulsation: Absent Extremities: Edema: Absent. Pulses: Absent left popliteal otherwise unremarkable. Bilateral femoral bruits are heard Neuro: Normal  Psych: Age this  ECG: Sinus bradycardia at 49 beats per minute otherwise unremarkable

## 2013-06-11 NOTE — Patient Instructions (Signed)
START Asprin 81mg  daily  Your physician has requested that you have an exercise tolerance test. For further information please visit [email or web address]. Please also follow instruction sheet, as given.   Your physician has requested that you have a carotid duplex. This test is an ultrasound of the carotid arteries in your neck. It looks at blood flow through these arteries that supply the brain with blood. Allow one hour for this exam. There are no restrictions or special instructions.   Follow up pending test results

## 2013-06-12 ENCOUNTER — Encounter: Payer: Self-pay | Admitting: *Deleted

## 2013-06-13 ENCOUNTER — Telehealth: Payer: Self-pay

## 2013-06-13 NOTE — Telephone Encounter (Signed)
Noted, thanks!

## 2013-06-13 NOTE — Telephone Encounter (Signed)
Mrs [facility] called and pt gave permission to speak with Mrs Ritzel; recent lab results requested. Mrs [facility] notified by result note. Mrs Brandstetter wanted Dr Damita Dunnings to know that pt is scheduled for [place] of carotid next week and in two weeks scheduled for stress test. Pt is feeling about the same as when seen but his appetite has increased slightly. Mrs Dugdale does not require cb unless Dr Damita Dunnings needs to advise pt further.

## 2013-06-18 ENCOUNTER — Ambulatory Visit (HOSPITAL_COMMUNITY): Payer: Medicare Other | Attending: Internal Medicine | Admitting: *Deleted

## 2013-06-18 ENCOUNTER — Encounter: Payer: Self-pay | Admitting: Internal Medicine

## 2013-06-18 DIAGNOSIS — I6529 Occlusion and stenosis of unspecified carotid artery: Secondary | ICD-10-CM

## 2013-06-18 DIAGNOSIS — R0989 Other specified symptoms and signs involving the circulatory and respiratory systems: Secondary | ICD-10-CM | POA: Insufficient documentation

## 2013-06-18 NOTE — Progress Notes (Signed)
Carotid Duplex complete

## 2013-06-19 ENCOUNTER — Telehealth: Payer: Self-pay

## 2013-06-19 DIAGNOSIS — R0989 Other specified symptoms and signs involving the circulatory and respiratory systems: Secondary | ICD-10-CM

## 2013-06-19 NOTE — Telephone Encounter (Signed)
Message copied by Lamar Laundry on Thu Jun 19, 2013  9:39 AM ------      Message from: Daneen Schick      Created: Wed Jun 18, 2013  5:45 PM       Mod plaque left greater than right. F/U in 6 months ------

## 2013-06-19 NOTE — Telephone Encounter (Signed)
°  Patient is returning your call, please call back.  °

## 2013-06-19 NOTE — Telephone Encounter (Signed)
pt wife given carotid results.Mod plaque left greater than right. F/U in 6 months.pt wife verbalized understanding.

## 2013-06-19 NOTE — Telephone Encounter (Signed)
called to give pt carotid results lmom fo pt to call back

## 2013-06-24 ENCOUNTER — Telehealth (HOSPITAL_COMMUNITY): Payer: Self-pay

## 2013-06-25 ENCOUNTER — Telehealth (HOSPITAL_COMMUNITY): Payer: Self-pay

## 2013-06-26 ENCOUNTER — Ambulatory Visit (HOSPITAL_COMMUNITY)
Admission: RE | Admit: 2013-06-26 | Discharge: 2013-06-26 | Disposition: A | Discharge status: Home / Self Care | Payer: Medicare Other | Source: Ambulatory Visit | Attending: Cardiology | Admitting: Cardiology

## 2013-06-26 DIAGNOSIS — I2510 Atherosclerotic heart disease of native coronary artery without angina pectoris: Secondary | ICD-10-CM

## 2013-06-27 ENCOUNTER — Telehealth: Payer: Self-pay | Admitting: Interventional Cardiology

## 2013-06-27 NOTE — Telephone Encounter (Signed)
Returned pt call.adv pt wife that once gxt results are reviewed by Dr.Smith I will call back with results.pt wife verbalized understanding.

## 2013-06-27 NOTE — Telephone Encounter (Signed)
New message     Want stress test results from [facility]

## 2013-06-30 ENCOUNTER — Telehealth: Payer: Self-pay | Admitting: Interventional Cardiology

## 2013-06-30 ENCOUNTER — Telehealth: Payer: Self-pay

## 2013-06-30 NOTE — Telephone Encounter (Signed)
error 

## 2013-06-30 NOTE — Telephone Encounter (Signed)
The stress test is normal.

## 2013-06-30 NOTE — Telephone Encounter (Signed)
  returned pt wife call.pt gxt was normal.no further work up at this time.pt to call the office if cardiac symptoms develop.pt wife verbalized understanding.

## 2013-06-30 NOTE — Telephone Encounter (Signed)
Follow up     Want stress test results

## 2013-08-20 NOTE — Telephone Encounter (Signed)
Encounter complete. 

## 2013-09-04 NOTE — Telephone Encounter (Signed)
Encounter complete. 

## 2014-01-07 ENCOUNTER — Ambulatory Visit (HOSPITAL_COMMUNITY): Payer: Medicare Other | Attending: Cardiovascular Disease | Admitting: Cardiology

## 2014-01-07 DIAGNOSIS — Z87891 Personal history of nicotine dependence: Secondary | ICD-10-CM | POA: Diagnosis not present

## 2014-01-07 DIAGNOSIS — I2510 Atherosclerotic heart disease of native coronary artery without angina pectoris: Secondary | ICD-10-CM | POA: Diagnosis not present

## 2014-01-07 DIAGNOSIS — I6523 Occlusion and stenosis of bilateral carotid arteries: Secondary | ICD-10-CM

## 2014-01-07 DIAGNOSIS — R0989 Other specified symptoms and signs involving the circulatory and respiratory systems: Secondary | ICD-10-CM

## 2014-01-07 NOTE — Progress Notes (Signed)
Carotid duplex performed 

## 2014-01-13 ENCOUNTER — Telehealth: Payer: Self-pay

## 2014-01-13 DIAGNOSIS — I6523 Occlusion and stenosis of bilateral carotid arteries: Secondary | ICD-10-CM

## 2014-01-13 NOTE — Telephone Encounter (Signed)
-----   Message from Belva Crome III, MD sent at 01/12/2014  5:58 PM EST ----- 60-79% left internal carotid and less than 59% right internal carotid. Needs follow-up in 6 months.

## 2014-01-13 NOTE — Telephone Encounter (Signed)
Pt aware of Carotid results.60-79% left internal carotid and less than 59% right internal carotid. Needs follow-up in 6 months.pt verbalized understanding.

## 2014-04-02 DIAGNOSIS — H2513 Age-related nuclear cataract, bilateral: Secondary | ICD-10-CM | POA: Diagnosis not present

## 2014-04-02 DIAGNOSIS — H524 Presbyopia: Secondary | ICD-10-CM | POA: Diagnosis not present

## 2014-04-07 ENCOUNTER — Encounter: Payer: Self-pay | Admitting: Family Medicine

## 2014-04-07 ENCOUNTER — Ambulatory Visit (INDEPENDENT_AMBULATORY_CARE_PROVIDER_SITE_OTHER)
Admission: RE | Admit: 2014-04-07 | Discharge: 2014-04-07 | Disposition: A | Discharge status: Home / Self Care | Payer: Medicare Other | Source: Ambulatory Visit | Attending: Family Medicine | Admitting: Family Medicine

## 2014-04-07 ENCOUNTER — Ambulatory Visit (INDEPENDENT_AMBULATORY_CARE_PROVIDER_SITE_OTHER): Payer: Medicare Other | Admitting: Family Medicine

## 2014-04-07 VITALS — BP 130/80 | HR 58 | Temp 97.5°F | Wt 133.0 lb

## 2014-04-07 DIAGNOSIS — J449 Chronic obstructive pulmonary disease, unspecified: Secondary | ICD-10-CM | POA: Diagnosis not present

## 2014-04-07 DIAGNOSIS — R05 Cough: Secondary | ICD-10-CM

## 2014-04-07 DIAGNOSIS — M7630 Iliotibial band syndrome, unspecified leg: Secondary | ICD-10-CM | POA: Insufficient documentation

## 2014-04-07 DIAGNOSIS — M7632 Iliotibial band syndrome, left leg: Secondary | ICD-10-CM

## 2014-04-07 DIAGNOSIS — R059 Cough, unspecified: Secondary | ICD-10-CM | POA: Insufficient documentation

## 2014-04-07 NOTE — Patient Instructions (Signed)
Take ibuprofen with food, ice your left knee (on the side) and work on the exercises (leg lifts, rotated leg lifts, and side leg lifts).   Go to the lab on the way out.  We'll contact you with your xray report. If the cough doesn't continue to improve, the notify me.  That should help.  Take care.

## 2014-04-07 NOTE — Assessment & Plan Note (Addendum)
Nontoxic, likely combination of underlying COPD changes and a post viral source.   Improved recently, if the cough doesn't continue to improve then he'll notify me.  See notes on CXR.  I asked him to schedule a CPE.

## 2014-04-07 NOTE — Assessment & Plan Note (Signed)
L ITB weaker than L quad on testing, L ITB sore distally on testing.  D/w pt.  Doesn't look to be a "knee" problem.  rx'd home exercise, gradually increase reps and fu prn.  He agrees.

## 2014-04-07 NOTE — Progress Notes (Signed)
Pre visit review using our clinic review tool, if applicable. No additional management support is needed unless otherwise documented below in the visit note.  1 week ago, started having L knee pain, laterally, not at the kneecap.  Throbs when walking (not on initial steps), not as painful when sitting.  After walking a lot, ie walking the dog, it gets some better.  No trauma, bruising, swelling.  No redness.  Taking ibuprofen prn with some relief.  No popping usually, he can occ hear a click with knee ROM.  Tried joint/flex cream w/o relief.    Cough in AM, noted right about the time he is getting up.  Long standing.  Worse in the last few months.  He had a "bad cold" early in winter.  Not coughing during the day o/w.  Former smoker.  Some sputum, but only in AM.  Sputum if faintly yellow.  No blood in sputum.  No weight loss.  Not SOB.  Likely some underlying COPD, based on prev CT chest.   His cough is getting slowly better, since the cold in early AM.  No nighttime coughing.  Scant wheeze, intermittent.  No fevers.    Meds, vitals, and allergies reviewed.   ROS: See HPI.  Otherwise, noncontributory.  nad ncat OP wnl, MMM Neck supple, no LA rrr ctab L knee with normal ROM, no locking or clicking, ACL feels solid.  Joint line not ttp L ITB weaker than L quad on testing, L ITB sore distally on testing.

## 2014-05-03 ENCOUNTER — Other Ambulatory Visit: Payer: Self-pay | Admitting: Family Medicine

## 2014-05-03 DIAGNOSIS — I2510 Atherosclerotic heart disease of native coronary artery without angina pectoris: Secondary | ICD-10-CM

## 2014-05-06 ENCOUNTER — Other Ambulatory Visit (INDEPENDENT_AMBULATORY_CARE_PROVIDER_SITE_OTHER): Payer: Medicare Other

## 2014-05-06 DIAGNOSIS — I2510 Atherosclerotic heart disease of native coronary artery without angina pectoris: Secondary | ICD-10-CM | POA: Diagnosis not present

## 2014-05-06 LAB — COMPREHENSIVE METABOLIC PANEL
ALBUMIN: 4.3 g/dL (ref 3.5–5.2)
ALT: 9 U/L (ref 0–53)
AST: 15 U/L (ref 0–37)
Alkaline Phosphatase: 65 U/L (ref 39–117)
BUN: 11 mg/dL (ref 6–23)
CHLORIDE: 102 meq/L (ref 96–112)
CO2: 32 mEq/L (ref 19–32)
CREATININE: 0.83 mg/dL (ref 0.40–1.50)
Calcium: 9.3 mg/dL (ref 8.4–10.5)
GFR: 96 mL/min (ref >60.00)
GLUCOSE: 107 mg/dL — AB (ref 70–99)
POTASSIUM: 3.9 meq/L (ref 3.5–5.1)
Sodium: 138 mEq/L (ref 135–145)
Total Bilirubin: 0.5 mg/dL (ref 0.2–1.2)
Total Protein: 7.1 g/dL (ref 6.0–8.3)

## 2014-05-06 LAB — LIPID PANEL
Cholesterol: 179 mg/dL (ref 0–200)
HDL: 35.6 mg/dL — ABNORMAL LOW (ref >39.00)
LDL Cholesterol: 114 mg/dL — ABNORMAL HIGH (ref 0–99)
NonHDL: 143.4
Total CHOL/HDL Ratio: 5
Triglycerides: 145 mg/dL (ref 0.0–149.0)
VLDL: 29 mg/dL (ref 0.0–40.0)

## 2014-05-12 ENCOUNTER — Encounter: Payer: Medicare Other | Admitting: Family Medicine

## 2014-05-12 DIAGNOSIS — G5792 Unspecified mononeuropathy of left lower limb: Secondary | ICD-10-CM | POA: Diagnosis not present

## 2014-05-12 DIAGNOSIS — M79605 Pain in left leg: Secondary | ICD-10-CM | POA: Diagnosis not present

## 2014-05-26 DIAGNOSIS — M79605 Pain in left leg: Secondary | ICD-10-CM | POA: Diagnosis not present

## 2014-05-26 DIAGNOSIS — G5792 Unspecified mononeuropathy of left lower limb: Secondary | ICD-10-CM | POA: Diagnosis not present

## 2014-05-27 ENCOUNTER — Encounter: Payer: Self-pay | Admitting: Family Medicine

## 2014-05-27 ENCOUNTER — Ambulatory Visit (INDEPENDENT_AMBULATORY_CARE_PROVIDER_SITE_OTHER): Payer: Medicare Other | Admitting: Family Medicine

## 2014-05-27 VITALS — BP 110/60 | HR 62 | Temp 97.6°F | Ht 67.5 in | Wt 130.5 lb

## 2014-05-27 DIAGNOSIS — Z7189 Other specified counseling: Secondary | ICD-10-CM | POA: Insufficient documentation

## 2014-05-27 DIAGNOSIS — Z23 Encounter for immunization: Secondary | ICD-10-CM | POA: Diagnosis not present

## 2014-05-27 DIAGNOSIS — M7632 Iliotibial band syndrome, left leg: Secondary | ICD-10-CM

## 2014-05-27 DIAGNOSIS — Z0000 Encounter for general adult medical examination without abnormal findings: Secondary | ICD-10-CM

## 2014-05-27 DIAGNOSIS — R911 Solitary pulmonary nodule: Secondary | ICD-10-CM

## 2014-05-27 DIAGNOSIS — I2510 Atherosclerotic heart disease of native coronary artery without angina pectoris: Secondary | ICD-10-CM

## 2014-05-27 NOTE — Assessment & Plan Note (Addendum)
Flu prev done Shingles 2013 PNA up to date Tetanus 2008 Colonoscopy due- d/w pt.  He'll want to get this done after his leg pain is addressed.  He'll call me when he wants referral.  Prostate cancer screening and PSA options (with potential risks and benefits of testing vs not testing) were discussed along with recent recs/guidelines.  He declined testing PSA at this point. Advance directive- wife designated if patient were incapacitated.  Cognitive function addressed- see scanned forms- and if abnormal then additional documentation follows.  D/w pt about healthy low carb diet and his labs.

## 2014-05-27 NOTE — Progress Notes (Signed)
Pre visit review using our clinic review tool, if applicable. No additional management support is needed unless otherwise documented below in the visit note.  I have personally reviewed the Medicare Annual Wellness questionnaire and have noted 1. The patient's medical and social history 2. Their use of alcohol, tobacco or illicit drugs 3. Their current medications and supplements 4. The patient's functional ability including ADL's, fall risks, home safety risks and hearing or visual             impairment. 5. Diet and physical activities 6. Evidence for depression or mood disorders  The patients weight, height, BMI have been recorded in the chart and visual acuity is per eye clinic.  I have made referrals, counseling and provided education to the patient based review of the above and I have provided the pt with a written personalized care plan for preventive services.  Provider list updated- see scanned forms.  Routine anticipatory guidance given to patient.  See health maintenance.  Flu prev done Shingles 2013 PNA up to date Tetanus 2008 Colonoscopy due- d/w pt.  He'll want to get this done after his leg pain is addressed.  He'll call me when he wants referral.  Prostate cancer screening and PSA options (with potential risks and benefits of testing vs not testing) were discussed along with recent recs/guidelines.  He declined testing PSA at this point. Advance directive- wife designated if patient were incapacitated.  Cognitive function addressed- see scanned forms- and if abnormal then additional documentation follows.   He had prev neg stress test with Dr. Tamala Julian.  Prev carotid eval noted, d/w pt.  On ASA.  No CP, SOB, BLE edema.   D/w pt about follow up pulmonary CT.  Prev reports reviewed about the nodule noted.  Likely to be benign but due for last CT scan.  He agrees to proceed.  His prev cough from the last OV has resolved.    He has seen Dr. Delilah Shan recently about his leg pain  and has been started on gabapentin, tramadol and mobic.    PMH and SH reviewed  Meds, vitals, and allergies reviewed.   ROS: See HPI.  Otherwise negative.    GEN: nad, alert and oriented HEENT: mucous membranes moist NECK: supple w/o LA CV: rrr. PULM: ctab, no inc wob ABD: soft, +bs EXT: no edema

## 2014-05-27 NOTE — Patient Instructions (Addendum)
Let me know when you want to go for your colonoscopy or if you need a referral for hearing clinic.  Rosaria Ferries will call about your referral. Try to ease back on the biscuits and bread.  Take care.  Glad to see you.

## 2014-05-27 NOTE — Assessment & Plan Note (Signed)
Awaiting input from ortho.  Appreciate ortho help.

## 2014-06-11 ENCOUNTER — Ambulatory Visit (INDEPENDENT_AMBULATORY_CARE_PROVIDER_SITE_OTHER)
Admission: RE | Admit: 2014-06-11 | Discharge: 2014-06-11 | Disposition: A | Discharge status: Home / Self Care | Payer: Medicare Other | Source: Ambulatory Visit | Attending: Family Medicine | Admitting: Family Medicine

## 2014-06-11 DIAGNOSIS — J439 Emphysema, unspecified: Secondary | ICD-10-CM | POA: Diagnosis not present

## 2014-06-11 DIAGNOSIS — R918 Other nonspecific abnormal finding of lung field: Secondary | ICD-10-CM | POA: Diagnosis not present

## 2014-06-11 DIAGNOSIS — R911 Solitary pulmonary nodule: Secondary | ICD-10-CM

## 2014-06-16 DIAGNOSIS — M545 Low back pain: Secondary | ICD-10-CM | POA: Diagnosis not present

## 2014-06-30 DIAGNOSIS — G5792 Unspecified mononeuropathy of left lower limb: Secondary | ICD-10-CM | POA: Diagnosis not present

## 2014-06-30 DIAGNOSIS — M5126 Other intervertebral disc displacement, lumbar region: Secondary | ICD-10-CM | POA: Diagnosis not present

## 2014-09-30 ENCOUNTER — Other Ambulatory Visit: Payer: Self-pay | Admitting: Neurological Surgery

## 2014-10-07 ENCOUNTER — Encounter (HOSPITAL_COMMUNITY): Payer: Self-pay

## 2014-10-07 ENCOUNTER — Encounter (HOSPITAL_COMMUNITY)
Admission: RE | Admit: 2014-10-07 | Discharge: 2014-10-07 | Disposition: A | Discharge status: Home / Self Care | Payer: Medicare Other | Source: Ambulatory Visit | Attending: Neurological Surgery | Admitting: Neurological Surgery

## 2014-10-07 DIAGNOSIS — M5126 Other intervertebral disc displacement, lumbar region: Secondary | ICD-10-CM | POA: Diagnosis not present

## 2014-10-07 DIAGNOSIS — Z01812 Encounter for preprocedural laboratory examination: Secondary | ICD-10-CM | POA: Diagnosis not present

## 2014-10-07 DIAGNOSIS — Z0183 Encounter for blood typing: Secondary | ICD-10-CM | POA: Insufficient documentation

## 2014-10-07 DIAGNOSIS — I739 Peripheral vascular disease, unspecified: Secondary | ICD-10-CM | POA: Insufficient documentation

## 2014-10-07 HISTORY — DX: Disorder of arteries and arterioles, unspecified: I77.9

## 2014-10-07 HISTORY — DX: Hesitancy of micturition: R39.11

## 2014-10-07 HISTORY — DX: Frequency of micturition: R35.0

## 2014-10-07 HISTORY — DX: Reserved for inherently not codable concepts without codable children: IMO0001

## 2014-10-07 HISTORY — DX: Peripheral vascular disease, unspecified: I73.9

## 2014-10-07 HISTORY — DX: Chronic obstructive pulmonary disease, unspecified: J44.9

## 2014-10-07 LAB — BASIC METABOLIC PANEL
ANION GAP: 9 (ref 5–15)
BUN: 11 mg/dL (ref 6–20)
CALCIUM: 9.1 mg/dL (ref 8.9–10.3)
CO2: 28 mmol/L (ref 22–32)
Chloride: 102 mmol/L (ref 101–111)
Creatinine, Ser: 0.82 mg/dL (ref 0.61–1.24)
Glucose, Bld: 120 mg/dL — ABNORMAL HIGH (ref 65–99)
Potassium: 3.8 mmol/L (ref 3.5–5.1)
Sodium: 139 mmol/L (ref 135–145)

## 2014-10-07 LAB — CBC
HCT: 39.9 % (ref 39.0–52.0)
HEMOGLOBIN: 13.6 g/dL (ref 13.0–17.0)
MCH: 30.2 pg (ref 26.0–34.0)
MCHC: 34.1 g/dL (ref 30.0–36.0)
MCV: 88.5 fL (ref 78.0–100.0)
PLATELETS: 238 10*3/uL (ref 150–400)
RBC: 4.51 MIL/uL (ref 4.22–5.81)
RDW: 13.3 % (ref 11.5–15.5)
WBC: 12.9 10*3/uL — ABNORMAL HIGH (ref 4.0–10.5)

## 2014-10-07 LAB — SURGICAL PCR SCREEN
MRSA, PCR: NEGATIVE
Staphylococcus aureus: POSITIVE — AB

## 2014-10-07 NOTE — Pre-Procedure Instructions (Signed)
    Joseph Parker  10/07/2014      [facility] - [place], [place] - [address] [place] [address] Phone: [phone] Fax: [phone]    Your procedure is scheduled on Wednesday, August 24th, 2016.   Report to [facility] Admitting at 1:00 P.M.  Call this number if you have problems the morning of surgery:  [phone]   Remember:  Do not eat food or drink liquids after midnight.   Take these medicines the morning of surgery with A SIP OF WATER: Gabapentin (Neurontin), Morphine (MSIR).  Stop taking: Meloxicam (Mobic), NSAIDS, Aspirin, Ibuprofen, Aleve, Naproxen, Motrin, BC's, Goody's, fish oil, all herbal medications, and all vitamins.    Do not wear jewelry.  Do not wear lotions, powders, or colognes.  You may NOT wear deodorant.  Men may shave face and neck.  Do not bring valuables to the hospital.  [facility] is not responsible for any belongings or valuables.  Contacts, dentures or bridgework may not be worn into surgery.  Leave your suitcase in the car.  After surgery it may be brought to your room.  For patients admitted to the hospital, discharge time will be determined by your treatment team.  Patients discharged the day of surgery will not be allowed to drive home.   Special instructions:  See attached.   Please read over the following fact sheets that you were given. Pain Booklet, Coughing and Deep Breathing, MRSA Information and Surgical Site Infection Prevention

## 2014-10-07 NOTE — Progress Notes (Signed)
Patient notified of positive PCR swab and verbalized understanding.    Prescription called into [facility] in [place].

## 2014-10-07 NOTE — Progress Notes (Signed)
PCP- Dr. Damita Dunnings Cardiologist - Dr. Daneen Schick  EKG- 10/07/14 - Epic CXR - 03/2014 - Epic Echo - denies Stress Test - 06/2013 - Epic Cardiac Cath - denies  Pt. Denies shortness of breath and chest pain at PAT appointment.

## 2014-10-13 MED ORDER — VANCOMYCIN HCL IN DEXTROSE 1-5 GM/200ML-% IV SOLN
1000.0000 mg | INTRAVENOUS | Status: AC
  Administered 2014-10-14: 1000 mg via INTRAVENOUS
  Filled 2014-10-13: qty 200

## 2014-10-13 NOTE — Progress Notes (Signed)
Spoke with patient's wife and verbally confirmed that patient understands that surgery time has changed from 1500 to 1130 and patient now needs to be at the hospital by 930 AM.

## 2014-10-14 ENCOUNTER — Ambulatory Visit (HOSPITAL_COMMUNITY): Payer: Medicare Other | Admitting: Certified Registered Nurse Anesthetist

## 2014-10-14 ENCOUNTER — Ambulatory Visit (HOSPITAL_COMMUNITY): Payer: Medicare Other

## 2014-10-14 ENCOUNTER — Encounter (HOSPITAL_COMMUNITY): Payer: Self-pay | Admitting: General Practice

## 2014-10-14 ENCOUNTER — Encounter (HOSPITAL_COMMUNITY)
Admission: RE | Disposition: A | Discharge status: Home / Self Care | Payer: Self-pay | Source: Ambulatory Visit | Attending: Neurological Surgery

## 2014-10-14 ENCOUNTER — Ambulatory Visit (HOSPITAL_COMMUNITY)
Admission: RE | Admit: 2014-10-14 | Discharge: 2014-10-15 | Disposition: A | Discharge status: Home / Self Care | Payer: Medicare Other | Source: Ambulatory Visit | Attending: Neurological Surgery | Admitting: Neurological Surgery

## 2014-10-14 DIAGNOSIS — Z87891 Personal history of nicotine dependence: Secondary | ICD-10-CM | POA: Insufficient documentation

## 2014-10-14 DIAGNOSIS — Z79891 Long term (current) use of opiate analgesic: Secondary | ICD-10-CM | POA: Diagnosis not present

## 2014-10-14 DIAGNOSIS — I739 Peripheral vascular disease, unspecified: Secondary | ICD-10-CM | POA: Diagnosis not present

## 2014-10-14 DIAGNOSIS — J449 Chronic obstructive pulmonary disease, unspecified: Secondary | ICD-10-CM | POA: Diagnosis not present

## 2014-10-14 DIAGNOSIS — I2510 Atherosclerotic heart disease of native coronary artery without angina pectoris: Secondary | ICD-10-CM | POA: Insufficient documentation

## 2014-10-14 DIAGNOSIS — Z9889 Other specified postprocedural states: Secondary | ICD-10-CM

## 2014-10-14 DIAGNOSIS — Z791 Long term (current) use of non-steroidal anti-inflammatories (NSAID): Secondary | ICD-10-CM | POA: Diagnosis not present

## 2014-10-14 DIAGNOSIS — Z7982 Long term (current) use of aspirin: Secondary | ICD-10-CM | POA: Diagnosis not present

## 2014-10-14 DIAGNOSIS — M5126 Other intervertebral disc displacement, lumbar region: Secondary | ICD-10-CM | POA: Diagnosis not present

## 2014-10-14 DIAGNOSIS — Z419 Encounter for procedure for purposes other than remedying health state, unspecified: Secondary | ICD-10-CM

## 2014-10-14 HISTORY — PX: LUMBAR LAMINECTOMY/DECOMPRESSION MICRODISCECTOMY: SHX5026

## 2014-10-14 SURGERY — LUMBAR LAMINECTOMY/DECOMPRESSION MICRODISCECTOMY 1 LEVEL
Anesthesia: General | Site: Back | Laterality: Left

## 2014-10-14 MED ORDER — 0.9 % SODIUM CHLORIDE (POUR BTL) OPTIME
TOPICAL | Status: DC | PRN
  Administered 2014-10-14: 1000 mL

## 2014-10-14 MED ORDER — OXYCODONE HCL 5 MG PO TABS
15.0000 mg | ORAL_TABLET | Freq: Once | ORAL | Status: AC
Start: 2014-10-14 — End: 2014-10-14
  Administered 2014-10-14: 15 mg via ORAL
  Filled 2014-10-14: qty 3

## 2014-10-14 MED ORDER — LIDOCAINE HCL (CARDIAC) 20 MG/ML IV SOLN
INTRAVENOUS | Status: DC | PRN
  Administered 2014-10-14: 80 mg via INTRAVENOUS

## 2014-10-14 MED ORDER — SODIUM CHLORIDE 0.9 % IJ SOLN
3.0000 mL | Freq: Two times a day (BID) | INTRAMUSCULAR | Status: DC
  Administered 2014-10-14 – 2014-10-15 (×2): 3 mL via INTRAVENOUS

## 2014-10-14 MED ORDER — SODIUM CHLORIDE 0.9 % IR SOLN
Status: DC | PRN
  Administered 2014-10-14: 13:00:00

## 2014-10-14 MED ORDER — CELECOXIB 200 MG PO CAPS
200.0000 mg | ORAL_CAPSULE | Freq: Two times a day (BID) | ORAL | Status: DC
  Administered 2014-10-14 – 2014-10-15 (×3): 200 mg via ORAL
  Filled 2014-10-14 (×3): qty 1

## 2014-10-14 MED ORDER — OXYCODONE HCL 5 MG PO TABS: 10.0000 mg | ORAL_TABLET | Freq: Once | ORAL | Status: DC | PRN

## 2014-10-14 MED ORDER — SUGAMMADEX SODIUM 200 MG/2ML IV SOLN
INTRAVENOUS | Status: DC | PRN
  Administered 2014-10-14: 150 mg via INTRAVENOUS

## 2014-10-14 MED ORDER — ONDANSETRON HCL 4 MG/2ML IJ SOLN: 4.0000 mg | INTRAMUSCULAR | Status: DC | PRN

## 2014-10-14 MED ORDER — THROMBIN 5000 UNITS EX SOLR
CUTANEOUS | Status: DC | PRN
  Administered 2014-10-14 (×2): 5000 [IU] via TOPICAL

## 2014-10-14 MED ORDER — SODIUM CHLORIDE 0.9 % IV SOLN
250.0000 mL | INTRAVENOUS | Status: DC
  Administered 2014-10-14: 250 mL via INTRAVENOUS

## 2014-10-14 MED ORDER — LACTATED RINGERS IV SOLN
INTRAVENOUS | Status: DC
  Administered 2014-10-14 (×2): via INTRAVENOUS

## 2014-10-14 MED ORDER — HEMOSTATIC AGENTS (NO CHARGE) OPTIME
TOPICAL | Status: DC | PRN
  Administered 2014-10-14: 1 via TOPICAL

## 2014-10-14 MED ORDER — METHOCARBAMOL 500 MG PO TABS: 500.0000 mg | ORAL_TABLET | Freq: Four times a day (QID) | ORAL | Status: DC | PRN

## 2014-10-14 MED ORDER — METHYLPREDNISOLONE ACETATE 80 MG/ML IJ SUSP
INTRAMUSCULAR | Status: DC | PRN
  Administered 2014-10-14: 80 mg

## 2014-10-14 MED ORDER — MORPHINE SULFATE (PF) 2 MG/ML IV SOLN: 1.0000 mg | INTRAVENOUS | Status: DC | PRN

## 2014-10-14 MED ORDER — BUPIVACAINE HCL (PF) 0.25 % IJ SOLN
INTRAMUSCULAR | Status: DC | PRN
  Administered 2014-10-14: 2 mL

## 2014-10-14 MED ORDER — FENTANYL CITRATE (PF) 100 MCG/2ML IJ SOLN
INTRAMUSCULAR | Status: AC
  Filled 2014-10-14: qty 2

## 2014-10-14 MED ORDER — FENTANYL CITRATE (PF) 250 MCG/5ML IJ SOLN
INTRAMUSCULAR | Status: AC
  Filled 2014-10-14: qty 5

## 2014-10-14 MED ORDER — KETAMINE HCL 100 MG/ML IJ SOLN
INTRAMUSCULAR | Status: AC
  Administered 2014-10-14: 50 mg via INTRAVENOUS
  Filled 2014-10-14: qty 1

## 2014-10-14 MED ORDER — FENTANYL CITRATE (PF) 100 MCG/2ML IJ SOLN
INTRAMUSCULAR | Status: DC | PRN
  Administered 2014-10-14: 25 ug via INTRAVENOUS

## 2014-10-14 MED ORDER — DEXTROSE 5 % IV SOLN
10.0000 mg | INTRAVENOUS | Status: DC | PRN
  Administered 2014-10-14: 10 ug/min via INTRAVENOUS

## 2014-10-14 MED ORDER — PROPOFOL 10 MG/ML IV BOLUS
INTRAVENOUS | Status: AC
  Filled 2014-10-14: qty 20

## 2014-10-14 MED ORDER — ACETAMINOPHEN 650 MG RE SUPP: 650.0000 mg | RECTAL | Status: DC | PRN

## 2014-10-14 MED ORDER — MENTHOL 3 MG MT LOZG: 1.0000 | LOZENGE | OROMUCOSAL | Status: DC | PRN

## 2014-10-14 MED ORDER — POTASSIUM CHLORIDE IN NACL 20-0.9 MEQ/L-% IV SOLN
INTRAVENOUS | Status: DC
Start: 2014-10-14 — End: 2014-10-15
  Administered 2014-10-14: 22:00:00 via INTRAVENOUS
  Filled 2014-10-14 (×3): qty 1000

## 2014-10-14 MED ORDER — OXYCODONE HCL 5 MG/5ML PO SOLN: 5.0000 mg | Freq: Once | ORAL | Status: DC | PRN

## 2014-10-14 MED ORDER — ACETAMINOPHEN 325 MG PO TABS: 650.0000 mg | ORAL_TABLET | ORAL | Status: DC | PRN

## 2014-10-14 MED ORDER — VANCOMYCIN HCL IN DEXTROSE 1-5 GM/200ML-% IV SOLN
1000.0000 mg | Freq: Once | INTRAVENOUS | Status: AC
  Administered 2014-10-15: 1000 mg via INTRAVENOUS
  Filled 2014-10-14: qty 200

## 2014-10-14 MED ORDER — PHENYLEPHRINE HCL 10 MG/ML IJ SOLN
INTRAMUSCULAR | Status: DC | PRN
  Administered 2014-10-14: 120 ug via INTRAVENOUS

## 2014-10-14 MED ORDER — EPHEDRINE SULFATE 50 MG/ML IJ SOLN
INTRAMUSCULAR | Status: DC | PRN
  Administered 2014-10-14: 10 mg via INTRAVENOUS

## 2014-10-14 MED ORDER — FENTANYL CITRATE (PF) 100 MCG/2ML IJ SOLN
INTRAMUSCULAR | Status: DC | PRN
  Administered 2014-10-14: 50 ug via INTRAVENOUS
  Administered 2014-10-14: 100 ug via INTRAVENOUS
  Administered 2014-10-14: 50 ug via INTRAVENOUS

## 2014-10-14 MED ORDER — OXYCODONE-ACETAMINOPHEN 5-325 MG PO TABS
1.0000 | ORAL_TABLET | ORAL | Status: DC | PRN
  Filled 2014-10-14: qty 2

## 2014-10-14 MED ORDER — METHOCARBAMOL 1000 MG/10ML IJ SOLN
500.0000 mg | Freq: Four times a day (QID) | INTRAMUSCULAR | Status: DC | PRN
  Filled 2014-10-14: qty 5

## 2014-10-14 MED ORDER — PHENOL 1.4 % MT LIQD: 1.0000 | OROMUCOSAL | Status: DC | PRN

## 2014-10-14 MED ORDER — GABAPENTIN 300 MG PO CAPS
300.0000 mg | ORAL_CAPSULE | Freq: Every day | ORAL | Status: DC
  Administered 2014-10-14: 300 mg via ORAL
  Filled 2014-10-14: qty 1

## 2014-10-14 MED ORDER — DEXAMETHASONE SODIUM PHOSPHATE 4 MG/ML IJ SOLN
4.0000 mg | Freq: Four times a day (QID) | INTRAMUSCULAR | Status: DC
Start: 2014-10-14 — End: 2014-10-15

## 2014-10-14 MED ORDER — ONDANSETRON HCL 4 MG/2ML IJ SOLN
INTRAMUSCULAR | Status: DC | PRN
  Administered 2014-10-14: 4 mg via INTRAVENOUS

## 2014-10-14 MED ORDER — SUGAMMADEX SODIUM 200 MG/2ML IV SOLN
INTRAVENOUS | Status: AC
  Filled 2014-10-14: qty 2

## 2014-10-14 MED ORDER — PROMETHAZINE HCL 25 MG/ML IJ SOLN
6.2500 mg | INTRAMUSCULAR | Status: DC | PRN
Start: 2014-10-14 — End: 2014-10-14

## 2014-10-14 MED ORDER — THROMBIN 5000 UNITS EX SOLR
OROMUCOSAL | Status: DC | PRN
  Administered 2014-10-14: 13:00:00 via TOPICAL

## 2014-10-14 MED ORDER — HYDROMORPHONE HCL 1 MG/ML IJ SOLN: 0.5000 mg | INTRAMUSCULAR | Status: DC | PRN

## 2014-10-14 MED ORDER — ROCURONIUM BROMIDE 100 MG/10ML IV SOLN
INTRAVENOUS | Status: DC | PRN
  Administered 2014-10-14: 50 mg via INTRAVENOUS

## 2014-10-14 MED ORDER — DEXAMETHASONE 4 MG PO TABS
4.0000 mg | ORAL_TABLET | Freq: Four times a day (QID) | ORAL | Status: DC
  Administered 2014-10-14 – 2014-10-15 (×4): 4 mg via ORAL
  Filled 2014-10-14 (×4): qty 1

## 2014-10-14 MED ORDER — SODIUM CHLORIDE 0.9 % IJ SOLN: 3.0000 mL | INTRAMUSCULAR | Status: DC | PRN

## 2014-10-14 MED ORDER — PROPOFOL 10 MG/ML IV BOLUS
INTRAVENOUS | Status: DC | PRN
  Administered 2014-10-14: 120 mg via INTRAVENOUS

## 2014-10-14 SURGICAL SUPPLY — 46 items
APL SKNCLS STERI-STRIP NONHPOA (GAUZE/BANDAGES/DRESSINGS) ×1
BAG DECANTER FOR FLEXI CONT (MISCELLANEOUS) ×3 IMPLANT
BENZOIN TINCTURE PRP APPL 2/3 (GAUZE/BANDAGES/DRESSINGS) ×3 IMPLANT
BUR MATCHSTICK NEURO 3.0 LAGG (BURR) ×3 IMPLANT
CANISTER SUCT 3000ML PPV (MISCELLANEOUS) ×3 IMPLANT
CLOSURE WOUND 1/2 X4 (GAUZE/BANDAGES/DRESSINGS) ×1
DRAPE LAPAROTOMY 100X72X124 (DRAPES) ×3 IMPLANT
DRAPE MICROSCOPE LEICA (MISCELLANEOUS) ×3 IMPLANT
DRAPE POUCH INSTRU U-SHP 10X18 (DRAPES) ×3 IMPLANT
DRAPE SURG 17X23 STRL (DRAPES) ×3 IMPLANT
DRSG OPSITE POSTOP 3X4 (GAUZE/BANDAGES/DRESSINGS) ×4 IMPLANT
DURAPREP 26ML APPLICATOR (WOUND CARE) ×3 IMPLANT
ELECT REM PT RETURN 9FT ADLT (ELECTROSURGICAL) ×3
ELECTRODE REM PT RTRN 9FT ADLT (ELECTROSURGICAL) ×1 IMPLANT
GAUZE SPONGE 4X4 16PLY XRAY LF (GAUZE/BANDAGES/DRESSINGS) IMPLANT
GLOVE BIO SURGEON STRL SZ8 (GLOVE) ×3 IMPLANT
GOWN STRL REUS W/ TWL LRG LVL3 (GOWN DISPOSABLE) IMPLANT
GOWN STRL REUS W/ TWL XL LVL3 (GOWN DISPOSABLE) ×1 IMPLANT
GOWN STRL REUS W/TWL 2XL LVL3 (GOWN DISPOSABLE) IMPLANT
GOWN STRL REUS W/TWL LRG LVL3 (GOWN DISPOSABLE)
GOWN STRL REUS W/TWL XL LVL3 (GOWN DISPOSABLE) ×3
HEMOSTAT POWDER KIT SURGIFOAM (HEMOSTASIS) IMPLANT
HEMOSTAT POWDER SURGIFOAM 1G (HEMOSTASIS) ×2 IMPLANT
KIT BASIN OR (CUSTOM PROCEDURE TRAY) ×3 IMPLANT
KIT ROOM TURNOVER OR (KITS) ×3 IMPLANT
NDL HYPO 18GX1.5 BLUNT FILL (NEEDLE) IMPLANT
NDL HYPO 25X1 1.5 SAFETY (NEEDLE) ×1 IMPLANT
NDL SPNL 20GX3.5 QUINCKE YW (NEEDLE) IMPLANT
NEEDLE HYPO 18GX1.5 BLUNT FILL (NEEDLE) ×3 IMPLANT
NEEDLE HYPO 25X1 1.5 SAFETY (NEEDLE) ×3 IMPLANT
NEEDLE SPNL 20GX3.5 QUINCKE YW (NEEDLE) IMPLANT
NS IRRIG 1000ML POUR BTL (IV SOLUTION) ×3 IMPLANT
PACK LAMINECTOMY NEURO (CUSTOM PROCEDURE TRAY) ×3 IMPLANT
PAD ARMBOARD 7.5X6 YLW CONV (MISCELLANEOUS) ×13 IMPLANT
RUBBERBAND STERILE (MISCELLANEOUS) ×6 IMPLANT
SPONGE SURGIFOAM ABS GEL SZ50 (HEMOSTASIS) ×3 IMPLANT
STRIP CLOSURE SKIN 1/2X4 (GAUZE/BANDAGES/DRESSINGS) ×2 IMPLANT
SUT VIC AB 0 CT1 18XCR BRD8 (SUTURE) ×1 IMPLANT
SUT VIC AB 0 CT1 8-18 (SUTURE) ×3
SUT VIC AB 2-0 CP2 18 (SUTURE) ×3 IMPLANT
SUT VIC AB 3-0 SH 8-18 (SUTURE) ×3 IMPLANT
SYR 20ML ECCENTRIC (SYRINGE) ×3 IMPLANT
SYR 3ML LL SCALE MARK (SYRINGE) ×2 IMPLANT
TOWEL OR 17X24 6PK STRL BLUE (TOWEL DISPOSABLE) ×3 IMPLANT
TOWEL OR 17X26 10 PK STRL BLUE (TOWEL DISPOSABLE) ×3 IMPLANT
WATER STERILE IRR 1000ML POUR (IV SOLUTION) ×3 IMPLANT

## 2014-10-14 NOTE — Op Note (Signed)
10/14/2014  1:12 PM  PATIENT:  Joseph Parker  74 y.o. 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 Christella Noa  ANESTHESIA:   General  EBL: 50 ml  Total I/O In: 1000 [I.V.:1000] Out: -   BLOOD ADMINISTERED:none  DRAINS: None   SPECIMEN:  No Specimen  INDICATION FOR PROCEDURE: This patient presented with severe left leg pain. MRI showed a large L3-4 extraforaminal disc herniation. He tried medical management without relief. His pain was debilitating. Patient understood the risks, benefits, and alternatives and potential outcomes and wished to proceed.  PROCEDURE DETAILS: The patient was taken to the operating room and after induction of adequate generalized endotracheal anesthesia, the patient was rolled into the prone position on the Wilson frame and all pressure points were padded. The lumbar region was cleaned and then prepped with DuraPrep and draped in the usual sterile fashion. 5 cc of local anesthesia was injected and then a dorsal midline incision was made and carried down to the lumbo sacral fascia. The fascia was opened and the paraspinous musculature was taken down in a subperiosteal fashion to expose L3-4 extraforaminal space on the left. Intraoperative x-ray confirmed my level, and then I used a combination of the high-speed drill and the Kerrison punches to perform a extraforaminal decompression and exposure at L3-4 on the left. The underlying yellow ligament was opened and removed in a piecemeal fashion to expose the exiting nerve root. A large fragment of disc was removed from the axilla of the nerve root. We also performed a thorough intradiscal discectomy. Large amounts of disc were removed. The nerve root was well decompressed.  I then palpated with a coronary  dilator along the nerve root and into the foramen to assure adequate decompression. I felt no more compression of the nerve root. I irrigated with saline solution containing bacitracin. Achieved hemostasis with bipolar cautery, lined the dura with Gelfoam, and then closed the fascia with 0 Vicryl. I closed the subcutaneous tissues with 2-0 Vicryl and the subcuticular tissues with 3-0 Vicryl. The skin was then closed with benzoin and Steri-Strips. The drapes were removed, a sterile dressing was applied. The patient was awakened from general anesthesia and transferred to the recovery room in stable condition. At the end of the procedure all sponge, needle and instrument counts were correct.   PLAN OF CARE: Admit for overnight observation  PATIENT DISPOSITION:  PACU - hemodynamically stable.   Delay start of Pharmacological VTE agent (>24hrs) due to surgical blood loss or risk of bleeding:  yes

## 2014-10-14 NOTE — Progress Notes (Signed)
ANTIBIOTIC CONSULT NOTE - INITIAL  Pharmacy Consult:  Vancomycin Indication:  Surgical prophylaxis  Allergies  Allergen Reactions  . Penicillins Itching, Swelling and Rash  . Tessalon [Benzonatate] Swelling    Had wide-spread numbness, throat swelling    Patient Measurements: Height: '5\' 7"'$  (170.2 cm) Weight: 129 lb 1.6 oz (58.559 kg) IBW/kg (Calculated) : 66.1  Vital Signs: Temp: 97.6 F (36.4 C) (08/24 1513) Temp Source: Oral (08/24 1513) BP: 152/67 mmHg (08/24 1513) Pulse Rate: 68 (08/24 1513) Intake/Output from this shift: Total I/O In: 1400 [I.V.:1400] Out: -   Labs: No results for input(s): WBC, HGB, PLT, LABCREA, CREATININE in the last 72 hours. Estimated Creatinine Clearance: 64.5 mL/min (by C-G formula based on Cr of 0.82). No results for input(s): VANCOTROUGH, VANCOPEAK, VANCORANDOM, GENTTROUGH, GENTPEAK, GENTRANDOM, TOBRATROUGH, TOBRAPEAK, TOBRARND, AMIKACINPEAK, AMIKACINTROU, AMIKACIN in the last 72 hours.   Microbiology: Recent Results (from the past 720 hour(s))  Surgical pcr screen     Status: Abnormal   Collection Time: 10/07/14  1:37 PM  Result Value Ref Range Status   MRSA, PCR NEGATIVE NEGATIVE Final   Staphylococcus aureus POSITIVE (A) NEGATIVE Final    Comment:        The Xpert SA Assay (FDA approved for NASAL specimens in patients over 64 years of age), is one component of a comprehensive surveillance program.  Test performance has been validated by [facility] for patients greater than or equal to 51 year old. It is not intended to diagnose infection nor to guide or monitor treatment.     Medical History: Past Medical History  Diagnosis Date  . Chickenpox   . Colon polyps   . Normal cardiac stress test     06/2013  . Carotid artery disease   . Carotid arterial disease   . COPD (chronic obstructive pulmonary disease)   . Shortness of breath dyspnea   . Urinary frequency   . Urinary hesitancy       Assessment: 74 YOM s/p  lumbar microdiskectomy to continue vancomycin post-op for surgical prophylaxis.  Patient does not have a drain and he received vancomycin 1gm IV x 1 at 1115 today.  Baseline labs reviewed.   Goal of Therapy:  Infection prevention   Plan:  - Vanc 1gm IV x 1 tonight - Pharmacy will sign off.  Thank you for the consult!    Chonda Baney D. Mina Marble, PharmD, BCPS Pager:  [phone] 10/14/2014, 4:46 PM

## 2014-10-14 NOTE — Anesthesia Procedure Notes (Signed)
Procedure Name: Intubation Date/Time: 10/14/2014 11:34 AM Performed by: Shirlyn Goltz Pre-anesthesia Checklist: Patient identified, Emergency Drugs available, Suction available and Patient being monitored Patient Re-evaluated:Patient Re-evaluated prior to inductionOxygen Delivery Method: Circle system utilized Preoxygenation: Pre-oxygenation with 100% oxygen Intubation Type: IV induction Ventilation: Mask ventilation without difficulty and Oral airway inserted - appropriate to patient size Grade View: Grade I Tube type: Oral Tube size: 7.0 mm Number of attempts: 1 Airway Equipment and Method: Stylet Placement Confirmation: ETT inserted through vocal cords under direct vision,  positive ETCO2 and breath sounds checked- equal and bilateral Secured at: 22 cm Tube secured with: Tape Dental Injury: Teeth and Oropharynx as per pre-operative assessment

## 2014-10-14 NOTE — Progress Notes (Signed)
Pt. Complaining of 9/10 pain.  Notified anesthesiologist, Dr. Jillyn Hidden.

## 2014-10-14 NOTE — Anesthesia Preprocedure Evaluation (Addendum)
Anesthesia Evaluation  Patient identified by MRN, date of birth, ID band Patient awake    Reviewed: Allergy & Precautions, H&P , NPO status , Patient's Chart, lab work & pertinent test results  History of Anesthesia Complications Negative for: history of anesthetic complications  Airway Mallampati: I  TM Distance: >3 FB Neck ROM: full    Dental  (+) Edentulous Lower, Edentulous Upper   Pulmonary COPDformer smoker,  breath sounds clear to auscultation  Pulmonary exam normal       Cardiovascular + CAD and + Peripheral Vascular Disease Normal cardiovascular examRhythm:regular Rate:Normal + Carotid Bruit    Neuro/Psych negative neurological ROS     GI/Hepatic negative GI ROS, Neg liver ROS,   Endo/Other  negative endocrine ROS  Renal/GU negative Renal ROS     Musculoskeletal   Abdominal   Peds  Hematology negative hematology ROS (+)   Anesthesia Other Findings 06/2013 normal stress test Carotid artery disease, L>R, moderate obstruction, following every 6 months Patient has obvious carotid bruits bilateral  Reproductive/Obstetrics negative OB ROS                            Anesthesia Physical Anesthesia Plan  ASA: III  Anesthesia Plan: General   Post-op Pain Management:    Induction: Intravenous  Airway Management Planned: Oral ETT  Additional Equipment:   Intra-op Plan:   Post-operative Plan:   Informed Consent: I have reviewed the patients History and Physical, chart, labs and discussed the procedure including the risks, benefits and alternatives for the proposed anesthesia with the patient or authorized representative who has indicated his/her understanding and acceptance.     Plan Discussed with: Anesthesiologist, CRNA and Surgeon  Anesthesia Plan Comments: (Will keep BP at baseline given risk of ischemic stroke with left Internal carotid artery disease Chronic pain  requiring MS Contin '30mg'$  every 6 hours, '15mg'$  oxycodone PO given here in house in short stay while waiting for surgery due to severe pain, will give multimodal analgesia therapy)       Anesthesia Quick Evaluation

## 2014-10-14 NOTE — Transfer of Care (Signed)
Immediate Anesthesia Transfer of Care Note  Patient: Joseph Parker  Procedure(s) Performed: Procedure(s) with comments: Left Lumbar three-four extraforaminla microdiskectomy (Left) - Left L34 extraforaminal microdiskectomy  Patient Location: PACU  Anesthesia Type:General  Level of Consciousness: awake and patient cooperative  Airway & Oxygen Therapy: Patient Spontanous Breathing and Patient connected to nasal cannula oxygen  Post-op Assessment: Report given to RN moving all extremities  Post vital signs: Reviewed and stable  Last Vitals:  Filed Vitals:   10/14/14 1020  BP: 136/72  Pulse: 70  Temp:   Resp: 21    Complications: No apparent anesthesia complications

## 2014-10-14 NOTE — Anesthesia Postprocedure Evaluation (Signed)
  Anesthesia Post-op Note  Patient: Joseph Parker  Procedure(s) Performed: Procedure(s) (LRB): Left Lumbar three-four extraforaminla microdiskectomy (Left)  Patient Location: PACU  Anesthesia Type: General  Level of Consciousness: awake and alert   Airway and Oxygen Therapy: Patient Spontanous Breathing  Post-op Pain: mild  Post-op Assessment: Post-op Vital signs reviewed, Patient's Cardiovascular Status Stable, Respiratory Function Stable, Patent Airway and No signs of Nausea or vomiting  Last Vitals:  Filed Vitals:   10/14/14 1400  BP: 133/59  Pulse: 59  Temp:   Resp: 12    Post-op Vital Signs: stable   Complications: No apparent anesthesia complications

## 2014-10-14 NOTE — H&P (Signed)
Subjective: Patient is a 74 y.o. male admitted for left leg pain. Onset of symptoms was several weeks ago, gradually worsening since that time.  The pain is rated intense, and is located at the across the lower back and radiates to left leg. The pain is described as aching and occurs all day. The symptoms have been progressive. Symptoms are exacerbated by exercise. MRI or CT showed extra foraminal disc protrusion L3-4 on the left   Past Medical History  Diagnosis Date  . Chickenpox   . Colon polyps   . Normal cardiac stress test     06/2013  . Carotid artery disease   . Carotid arterial disease   . COPD (chronic obstructive pulmonary disease)   . Shortness of breath dyspnea   . Urinary frequency   . Urinary hesitancy     Past Surgical History  Procedure Laterality Date  . Tonsillectomy      remote - childhood  . Hernia repair      right inquinal - as child  . Colonoscopy    . Esophagogastroduodenoscopy      Prior to Admission medications   Medication Sig Start Date End Date Taking? Authorizing Provider  aspirin EC 81 MG tablet Take 1 tablet (81 mg total) by mouth daily. 06/11/13  Yes Belva Crome, MD  gabapentin (NEURONTIN) 100 MG capsule Take 300 mg by mouth at bedtime.  05/12/14  Yes Historical Provider, MD  meloxicam (MOBIC) 15 MG tablet Take 15 mg by mouth daily.   Yes Historical Provider, MD  morphine (MSIR) 30 MG tablet Take 30 mg by mouth every 6 (six) hours as needed for severe pain.   Yes Historical Provider, MD   Allergies  Allergen Reactions  . Penicillins Itching, Swelling and Rash  . Tessalon [Benzonatate] Swelling    Had wide-spread numbness, throat swelling    Social History  Substance Use Topics  . Smoking status: Former Smoker    Quit date: 07/20/1999  . Smokeless tobacco: Never Used  . Alcohol Use: No    Family History  Problem Relation Age of Onset  . Heart disease Mother     CABG; Valve replacement - died post-op  . Alzheimer's disease Father   .  Diabetes Father   . Diabetes Son   . Colon cancer Neg Hx   . Prostate cancer Neg Hx      Review of Systems  Positive ROS: Negative  All other systems have been reviewed and were otherwise negative with the exception of those mentioned in the HPI and as above.  Objective: Vital signs in last 24 hours: Temp:  [98.3 F (36.8 C)] 98.3 F (36.8 C) (08/24 0925) Pulse Rate:  [70-80] 70 (08/24 1020) Resp:  [18-21] 21 (08/24 1020) BP: (134-158)/(69-75) 136/72 mmHg (08/24 1020) SpO2:  [97 %-100 %] 100 % (08/24 1020) Weight:  [129 lb 1.6 oz (58.559 kg)] 129 lb 1.6 oz (58.559 kg) (08/24 0925)  General Appearance: Alert, cooperative, no distress, appears stated age Head: Normocephalic, without obvious abnormality, atraumatic Eyes: PERRL, conjunctiva/corneas clear, EOM's intact    Neck: Supple, symmetrical, trachea midline Back: Symmetric, no curvature, ROM normal, no CVA tenderness Lungs:  respirations unlabored Heart: Regular rate and rhythm Abdomen: Soft, non-tender Extremities: Extremities normal, atraumatic, no cyanosis or edema Pulses: 2+ and symmetric all extremities Skin: Skin color, texture, turgor normal, no rashes or lesions  NEUROLOGIC:   Mental status: Alert and oriented x4,  no aphasia, good attention span, fund of knowledge, and memory Motor  Exam - grossly normal Sensory Exam - grossly normal Reflexes: 1+ Coordination - grossly normal Gait - grossly normal Balance - grossly normal Cranial Nerves: I: smell Not tested  II: visual acuity  OS: nl    OD: nl  II: visual fields Full to confrontation  II: pupils Equal, round, reactive to light  III,VII: ptosis None  III,IV,VI: extraocular muscles  Full ROM  V: mastication Normal  V: facial light touch sensation  Normal  V,VII: corneal reflex  Present  VII: facial muscle function - upper  Normal  VII: facial muscle function - lower Normal  VIII: hearing Not tested  IX: soft palate elevation  Normal  IX,X: gag reflex  Present  XI: trapezius strength  5/5  XI: sternocleidomastoid strength 5/5  XI: neck flexion strength  5/5  XII: tongue strength  Normal    Data Review Lab Results  Component Value Date   WBC 12.9* 10/07/2014   HGB 13.6 10/07/2014   HCT 39.9 10/07/2014   MCV 88.5 10/07/2014   PLT 238 10/07/2014   Lab Results  Component Value Date   NA 139 10/07/2014   K 3.8 10/07/2014   CL 102 10/07/2014   CO2 28 10/07/2014   BUN 11 10/07/2014   CREATININE 0.82 10/07/2014   GLUCOSE 120* 10/07/2014   No results found for: INR, PROTIME  Assessment/Plan: Patient admitted for left L3-4 extraforaminal microdiscectomy. Patient has failed a reasonable attempt at conservative therapy.  I explained the condition and procedure to the patient and answered any questions.  Patient wishes to proceed with procedure as planned. Understands risks/ benefits and typical outcomes of procedure.   Seleny Allbright S 10/14/2014 10:53 AM

## 2014-10-15 ENCOUNTER — Encounter (HOSPITAL_COMMUNITY): Payer: Self-pay | Admitting: Neurological Surgery

## 2014-10-15 DIAGNOSIS — M5126 Other intervertebral disc displacement, lumbar region: Secondary | ICD-10-CM | POA: Diagnosis not present

## 2014-10-15 NOTE — Progress Notes (Signed)
D/C orders received, pt for D/C home today with home health care.  IV and telemetry D/C.  Rx and D/C instructions given with verbalized understanding.  Family at bedside to assist with D/C.  Staff brought pt downstairs via wheelchair.

## 2014-10-15 NOTE — Evaluation (Signed)
Physical Therapy Evaluation Patient Details Name: RHET RORKE MRN: [id number] DOB: 30-Jan-1941 Today's Date: 10/15/2014   History of Present Illness  Patient is a 74 yo male s/p Left Lumbar three-four extraforaminla microdiskectomy   Clinical Impression  Patient seen for mobility evaluation and education. Patient mobilizing very well. Reports no significant pain and demonstrates no need for assistive device. Patient ambulated increased distance and performed stair negotiation without difficulty. At this time feel patient will be safe for d/c home. No further acute PT needs. Will sign off.    Follow Up Recommendations No PT follow up;Supervision for mobility/OOB    Equipment Recommendations  None recommended by PT    Recommendations for Other Services       Precautions / Restrictions Precautions Precaution Comments: verbally educated on good techniques for safety with mobility after back surgery, educated for comfort      Mobility  Bed Mobility Overal bed mobility: Independent                Transfers Overall transfer level: Independent Equipment used: None             General transfer comment: steady with standing  Ambulation/Gait Ambulation/Gait assistance: Supervision Ambulation Distance (Feet): 380 Feet Assistive device: None Gait Pattern/deviations: WFL(Within Functional Limits)   Gait velocity interpretation: at or above normal speed for age/gender General Gait Details: good overall stability, no physical assist required, initially slow but improved to normal gait speed with distance  Stairs Stairs: Yes Stairs assistance: Supervision Stair Management: One rail Right;Step to pattern;Forwards Number of Stairs: 5 (x2) General stair comments: perform step over step the first attempt with some noted weakness and instability in LLE, educated on sequencing for step to technique and performed without physical assist   Wheelchair Mobility    Modified  Rankin (Stroke Patients Only)       Balance Overall balance assessment: No apparent balance deficits (not formally assessed)                                           Pertinent Vitals/Pain Pain Assessment: 0-10 Pain Score: 1  Pain Location: incision Pain Descriptors / Indicators: Operative site guarding    Home Living Family/patient expects to be discharged to:: Private residence Living Arrangements: Spouse/significant other   Type of Home: House Home Access: Stairs to enter Entrance Stairs-Rails: Can reach both Entrance Stairs-Number of Steps: 5 Home Layout: One level Home Equipment: [place] - single point      Prior Function Level of Independence: Independent         Comments: was in so much pain prior to admission that he was barely mobilizing     Hand Dominance   Dominant Hand: Right    Extremity/Trunk Assessment   Upper Extremity Assessment: Overall WFL for tasks assessed           Lower Extremity Assessment: LLE deficits/detail   LLE Deficits / Details: modest strength deficits noted 4/5 LLE     Communication   Communication: No difficulties  Cognition Arousal/Alertness: Awake/alert Behavior During Therapy: WFL for tasks assessed/performed Overall Cognitive Status: Within Functional Limits for tasks assessed                      General Comments      Exercises        Assessment/Plan    PT Assessment Patent does not  need any further PT services  PT Diagnosis Difficulty walking;Acute pain   PT Problem List    PT Treatment Interventions     PT Goals (Current goals can be found in the Care Plan section) Acute Rehab PT Goals Patient Stated Goal: to go home today PT Goal Formulation: With patient/family Time For Goal Achievement: 10/22/14 Potential to Achieve Goals: Good    Frequency     Barriers to discharge        Co-evaluation               End of Session Equipment Utilized During Treatment:  Gait belt Activity Tolerance: Patient tolerated treatment well Patient left: in bed;with call bell/phone within reach;with family/visitor present;with SCD's reapplied Nurse Communication: Mobility status    Functional Assessment Tool Used: clinical judgement Functional Limitation: Mobility: Walking and moving around Mobility: Walking and Moving Around Current Status (V2023): At least 1 percent but less than 20 percent impaired, limited or restricted Mobility: Walking and Moving Around Goal Status [phone]): At least 1 percent but less than 20 percent impaired, limited or restricted Mobility: Walking and Moving Around Discharge Status [phone]): At least 1 percent but less than 20 percent impaired, limited or restricted    Time: 0927-0944 PT Time Calculation (min) (ACUTE ONLY): 17 min   Charges:   PT Evaluation $Initial PT Evaluation Tier I: 1 Procedure     PT G Codes:   PT G-Codes **NOT FOR INPATIENT CLASS** Functional Assessment Tool Used: clinical judgement Functional Limitation: Mobility: Walking and moving around Mobility: Walking and Moving Around Current Status (H7290): At least 1 percent but less than 20 percent impaired, limited or restricted Mobility: Walking and Moving Around Goal Status [phone]): At least 1 percent but less than 20 percent impaired, limited or restricted Mobility: Walking and Moving Around Discharge Status [phone]): At least 1 percent but less than 20 percent impaired, limited or restricted    Duncan Dull 10/15/2014, 9:50 AM Alben Deeds, PT DPT  [phone]

## 2014-10-15 NOTE — Discharge Summary (Signed)
Physician Discharge Summary  Patient ID: Joseph Parker MRN: [id number] DOB/AGE: 05-28-1940 74 y.o.  Admit date: 10/14/2014 Discharge date: 10/15/2014  Admission Diagnoses: L3-4 extraforaminal disk    Discharge Diagnoses: same   Discharged Condition: good  Hospital Course: The patient was admitted on 10/14/2014 and taken to the operating room where the patient underwent l3-4 extraforaminal diskectomy. The patient tolerated the procedure well and was taken to the recovery room and then to the floor in stable condition. The hospital course was routine. There were no complications. The wound remained clean dry and intact. Pt had appropriate back soreness. No complaints of leg pain or new N/T/W. The patient remained afebrile with stable vital signs, and tolerated a regular diet. The patient continued to increase activities, and pain was well controlled with oral pain medications.   Consults: None  Significant Diagnostic Studies:  Results for orders placed or performed during the hospital encounter of 10/07/14  Surgical pcr screen  Result Value Ref Range   MRSA, PCR NEGATIVE NEGATIVE   Staphylococcus aureus POSITIVE (A) NEGATIVE  Basic metabolic panel  Result Value Ref Range   Sodium 139 135 - 145 mmol/L   Potassium 3.8 3.5 - 5.1 mmol/L   Chloride 102 101 - 111 mmol/L   CO2 28 22 - 32 mmol/L   Glucose, Bld 120 (H) 65 - 99 mg/dL   BUN 11 6 - 20 mg/dL   Creatinine, Ser 0.82 0.61 - 1.24 mg/dL   Calcium 9.1 8.9 - 10.3 mg/dL   GFR calc non Af Amer >60 >60 mL/min   GFR calc Af Amer >60 >60 mL/min   Anion gap 9 5 - 15  CBC  Result Value Ref Range   WBC 12.9 (H) 4.0 - 10.5 K/uL   RBC 4.51 4.22 - 5.81 MIL/uL   Hemoglobin 13.6 13.0 - 17.0 g/dL   HCT 39.9 39.0 - 52.0 %   MCV 88.5 78.0 - 100.0 fL   MCH 30.2 26.0 - 34.0 pg   MCHC 34.1 30.0 - 36.0 g/dL   RDW 13.3 11.5 - 15.5 %   Platelets 238 150 - 400 K/uL    Dg Lumbar Spine 2-3 Views  10/14/2014   CLINICAL DATA:  Left L3-4 extra  foraminal microdiskectomy  EXAM: LUMBAR SPINE - 2-3 VIEW  COMPARISON:  MRI lumbar spine dated 06/16/2014  FINDINGS: Two lateral views of the lumbar spine.  Initial radiograph demonstrates a surgical probe posterior to L3-4.  Second lumbar radiograph demonstrates surgical hardware at L3-4.  IMPRESSION: Lumbar localization as above.   Electronically Signed   By: Julian Hy M.D.   On: 10/14/2014 13:22    Antibiotics:  Anti-infectives    Start     Dose/Rate Route Frequency Ordered Stop   10/14/14 2300  vancomycin (VANCOCIN) IVPB 1000 mg/200 mL premix     1,000 mg 200 mL/hr over 60 Minutes Intravenous  Once 10/14/14 1646 10/15/14 0116   10/14/14 1237  bacitracin 50,000 Units in sodium chloride irrigation 0.9 % 500 mL irrigation  Status:  Discontinued       As needed 10/14/14 1238 10/14/14 1315   10/14/14 1100  vancomycin (VANCOCIN) IVPB 1000 mg/200 mL premix     1,000 mg 200 mL/hr over 60 Minutes Intravenous To [facility] 10/13/14 1207 10/14/14 1215      Discharge Exam: Blood pressure 125/52, pulse 64, temperature 98.6 F (37 C), temperature source Oral, resp. rate 20, height '5\' 7"'$  (1.702 m), weight 129 lb 1.6 oz (58.559 kg), SpO2  98 %. Neurologic: Grossly normal Incision CDI  Discharge Medications:     Medication List    STOP taking these medications        morphine 30 MG tablet  Commonly known as:  MSIR      TAKE these medications        aspirin EC 81 MG tablet  Take 1 tablet (81 mg total) by mouth daily.     gabapentin 100 MG capsule  Commonly known as:  NEURONTIN  Take 300 mg by mouth at bedtime.     meloxicam 15 MG tablet  Commonly known as:  MOBIC  Take 15 mg by mouth daily.        Disposition: home  Final Dx: microdiskectomy      Discharge Instructions    Call MD for:  difficulty breathing, headache or visual disturbances    Complete by:  As directed      Call MD for:  persistant nausea and vomiting    Complete by:  As directed      Call MD  for:  redness, tenderness, or signs of infection (pain, swelling, redness, odor or green/yellow discharge around incision site)    Complete by:  As directed      Call MD for:  severe uncontrolled pain    Complete by:  As directed      Call MD for:  temperature >100.4    Complete by:  As directed      Diet - low sodium heart healthy    Complete by:  As directed      Discharge instructions    Complete by:  As directed   No driving, no heavy lifting, no bending or twisting     Increase activity slowly    Complete by:  As directed      Remove dressing in 48 hours    Complete by:  As directed               Signed: Elvia Aydin S 10/15/2014, 12:46 PM

## 2014-10-28 ENCOUNTER — Ambulatory Visit (INDEPENDENT_AMBULATORY_CARE_PROVIDER_SITE_OTHER): Payer: Medicare Other | Admitting: Family Medicine

## 2014-10-28 ENCOUNTER — Encounter: Payer: Self-pay | Admitting: Family Medicine

## 2014-10-28 VITALS — BP 108/68 | HR 68 | Temp 98.8°F | Wt 124.8 lb

## 2014-10-28 DIAGNOSIS — Z9889 Other specified postprocedural states: Secondary | ICD-10-CM

## 2014-10-28 DIAGNOSIS — R300 Dysuria: Secondary | ICD-10-CM | POA: Diagnosis not present

## 2014-10-28 LAB — POCT URINALYSIS DIPSTICK
Bilirubin, UA: NEGATIVE
Blood, UA: NEGATIVE
Glucose, UA: NEGATIVE
KETONES UA: NEGATIVE
Nitrite, UA: NEGATIVE
PROTEIN UA: NEGATIVE
Spec Grav, UA: 1.02
UROBILINOGEN UA: 0.2
pH, UA: 5.5

## 2014-10-28 MED ORDER — CIPROFLOXACIN HCL 250 MG PO TABS
250.0000 mg | ORAL_TABLET | Freq: Two times a day (BID) | ORAL | Status: DC
Start: 2014-10-28 — End: 2015-01-05

## 2014-10-28 NOTE — Patient Instructions (Signed)
Drink plenty of water and start the antibiotics today.  We'll contact you with your lab report.  Take care.   I'll await the notes from the surgery clinic.  Glad to see you.

## 2014-10-28 NOTE — Progress Notes (Signed)
Pre visit review using our clinic review tool, if applicable. No additional management support is needed unless otherwise documented below in the visit note.  His back pain is better than before the surgery but still has really sensitive skin on the L anterior shin, this is worse than his post op period.  He has f/u with surgery pending.  He is still on his baseline pain meds.  He doesn't have back pain.   Dysuria.  Recently with catheter placed for surgery.  Urinary frequency noted.  occ burning noted by patient.  U/a d/w pt.   ucx pending. No fevers, but had some cold sensations.  No vomiting.  No abd pain.    Meds, vitals, and allergies reviewed.   ROS: See HPI.  Otherwise, noncontributory.  nad ncat Mmm Neck supple rrr ctab abd soft, not ttp Normal BS No cva pain Lower back with healing incision normally.  No erythema.

## 2014-10-29 DIAGNOSIS — R300 Dysuria: Secondary | ICD-10-CM | POA: Insufficient documentation

## 2014-10-29 LAB — URINE CULTURE
COLONY COUNT: NO GROWTH
ORGANISM ID, BACTERIA: NO GROWTH

## 2014-10-29 NOTE — Assessment & Plan Note (Signed)
Would treat with cipro given the recent cath placement for surgery.  Okay for outpatient f/u.  Nontoxic.  D/w pt.  ucx pending.  He agrees with plan.

## 2014-10-29 NOTE — Assessment & Plan Note (Signed)
Still healing well at the surgery site.  His L leg sx should still improve, d/w pt, especially since so early in the process from surgery.  He has f/u with surgery pending. >25 minutes spent in face to face time with patient, >50% spent in counselling or coordination of care.

## 2015-01-05 ENCOUNTER — Ambulatory Visit (INDEPENDENT_AMBULATORY_CARE_PROVIDER_SITE_OTHER): Payer: Medicare Other | Admitting: Family Medicine

## 2015-01-05 ENCOUNTER — Encounter: Payer: Self-pay | Admitting: Family Medicine

## 2015-01-05 ENCOUNTER — Encounter (INDEPENDENT_AMBULATORY_CARE_PROVIDER_SITE_OTHER): Payer: Self-pay

## 2015-01-05 VITALS — BP 130/72 | HR 75 | Temp 97.5°F | Wt 120.8 lb

## 2015-01-05 DIAGNOSIS — Z125 Encounter for screening for malignant neoplasm of prostate: Secondary | ICD-10-CM

## 2015-01-05 DIAGNOSIS — Z9889 Other specified postprocedural states: Secondary | ICD-10-CM | POA: Diagnosis not present

## 2015-01-05 DIAGNOSIS — R634 Abnormal weight loss: Secondary | ICD-10-CM

## 2015-01-05 LAB — COMPREHENSIVE METABOLIC PANEL
ALK PHOS: 64 U/L (ref 39–117)
ALT: 5 U/L (ref 0–53)
AST: 10 U/L (ref 0–37)
Albumin: 4.3 g/dL (ref 3.5–5.2)
BILIRUBIN TOTAL: 0.4 mg/dL (ref 0.2–1.2)
BUN: 11 mg/dL (ref 6–23)
CO2: 31 mEq/L (ref 19–32)
Calcium: 9.7 mg/dL (ref 8.4–10.5)
Chloride: 104 mEq/L (ref 96–112)
Creatinine, Ser: 0.8 mg/dL (ref 0.40–1.50)
GFR: 99.99 mL/min (ref >60.00)
GLUCOSE: 94 mg/dL (ref 70–99)
Potassium: 4.4 mEq/L (ref 3.5–5.1)
Sodium: 141 mEq/L (ref 135–145)
TOTAL PROTEIN: 6.7 g/dL (ref 6.0–8.3)

## 2015-01-05 LAB — CBC WITH DIFFERENTIAL/PLATELET
BASOS ABS: 0 10*3/uL (ref 0.0–0.1)
Basophils Relative: 0.5 % (ref 0.0–3.0)
EOS ABS: 0.2 10*3/uL (ref 0.0–0.7)
Eosinophils Relative: 1.9 % (ref 0.0–5.0)
HCT: 43.5 % (ref 39.0–52.0)
Hemoglobin: 14.4 g/dL (ref 13.0–17.0)
LYMPHS ABS: 1.6 10*3/uL (ref 0.7–4.0)
Lymphocytes Relative: 19.6 % (ref 12.0–46.0)
MCHC: 33.2 g/dL (ref 30.0–36.0)
MCV: 88.9 fl (ref 78.0–100.0)
MONO ABS: 0.7 10*3/uL (ref 0.1–1.0)
Monocytes Relative: 8.1 % (ref 3.0–12.0)
NEUTROS PCT: 69.9 % (ref 43.0–77.0)
Neutro Abs: 5.8 10*3/uL (ref 1.4–7.7)
Platelets: 263 10*3/uL (ref 150.0–400.0)
RBC: 4.9 Mil/uL (ref 4.22–5.81)
RDW: 13.8 % (ref 11.5–15.5)
WBC: 8.2 10*3/uL (ref 4.0–10.5)

## 2015-01-05 LAB — PSA, MEDICARE: PSA: 3.17 ng/ml (ref 0.10–4.00)

## 2015-01-05 LAB — TSH: TSH: 0.9 u[IU]/mL (ref 0.35–4.50)

## 2015-01-05 MED ORDER — GABAPENTIN 100 MG PO CAPS: 200.0000 mg | ORAL_CAPSULE | Freq: Every day | ORAL | Status: DC

## 2015-01-05 NOTE — Progress Notes (Signed)
Back and leg is much much better, has been released by surgery clinic.  Tapering off gabapentin.  Had to take Citrucel prev with this opiates. Off both now, not constipated.    Dec in appetite, mild weight loss, some fatigue.  Back to smoking a few cigs a day, is trying to quit again, d/w pt. No nausea, no vomiting, no blood in stool.  No fevers, no sweats, no chills. His prev voiding sx are resolved.    Mult family members/in laws have been sick (wife's brother has brain cancer).    B shoulder pain radiating to the B hands w/o weakness.  Noted only at night.  Thin pillow.  Advised him to f/u with spine clinic if not better with pillow change.    PMH and SH reviewed  ROS: See HPI, otherwise noncontributory.  Meds, vitals, and allergies reviewed.   GEN: nad, alert and oriented HEENT: mucous membranes moist, OP wnl NECK: supple w/o LA CV: rrr.   PULM: ctab, no inc wob ABD: soft, +bs EXT: no edema SKIN: no jaundice

## 2015-01-05 NOTE — Patient Instructions (Signed)
Get a thicker pillow and if not better the let the spine clinic know.  Go to the lab on the way out.  We'll contact you with your lab report. You can try to cut back to '100mg'$  of gabapentin and see if you can tolerate that dose.  Take care.  Glad to see you.

## 2015-01-06 ENCOUNTER — Encounter: Payer: Self-pay | Admitting: Family Medicine

## 2015-01-06 DIAGNOSIS — R634 Abnormal weight loss: Secondary | ICD-10-CM | POA: Insufficient documentation

## 2015-01-06 NOTE — Assessment & Plan Note (Signed)
No obvious findings on exam.  No specific red flag sx.  Social concern- worry re: family members noted.  Check routine labs and then he'll update me.  He agrees.  Still okay for outpatient f/u.

## 2015-01-06 NOTE — Assessment & Plan Note (Signed)
Pain improved, he can try to taper down/off gabapentin.  >25 minutes spent in face to face time with patient, >50% spent in counselling or coordination of care.

## 2015-01-07 ENCOUNTER — Telehealth: Payer: Self-pay | Admitting: Family Medicine

## 2015-01-07 NOTE — Telephone Encounter (Signed)
Wife advised. 

## 2015-01-07 NOTE — Telephone Encounter (Signed)
Pt called and would like to know lab results as soon as able to receive.  Please give pt a call at [phone].  They prefer a call

## 2015-01-08 ENCOUNTER — Telehealth: Payer: Self-pay | Admitting: Family Medicine

## 2015-01-08 NOTE — Telephone Encounter (Signed)
Called and notified patient of Dr Josefine Class comments. Patient verbalized understanding.

## 2015-01-08 NOTE — Telephone Encounter (Signed)
Pt returned your call.  

## 2015-02-02 ENCOUNTER — Telehealth: Payer: Self-pay | Admitting: Family Medicine

## 2015-02-02 NOTE — Telephone Encounter (Signed)
Pt's wife called concerned about pt. She is not on dpr and is aware of the restrictions of this, but is still wanting to report what she is concerned about. Pt is also aware that she is calling. You can reach her at [phone] to get more information.

## 2015-02-02 NOTE — Telephone Encounter (Signed)
Spoke with wife.  Wife is very concerned that the patient's appetite is better but he is still not gaining weight.  Wife states that they have dealt with quite a bit of loss this past few months.  Her brother was diagnosed with brain cancer and died after 6 weeks from diagnosis.  Wife has had to be away from home 3-4 days at a time to be with her brother prior to his death.  Now that she is at home more, she feels that her husband is not doing well.  He seems to be dwelling on the death issues, both of family members and of pets.  Patient is saying he doesn't think he will live through Christmas.  Patient did stop his Gabapentin abruptly rather than a slow taper as instructed but wife feels that he is either "depressed" or having some other emotional issues.  I advised that an appointment to discuss this would be helpful to all involved.  Wife says she will talk with the patient and call back for appt.  I encouraged the wife to request a 30 minute appt when scheduling.

## 2015-02-02 NOTE — Telephone Encounter (Signed)
Yes, 45mn OV please.  Thanks.

## 2015-03-02 ENCOUNTER — Encounter: Payer: Self-pay | Admitting: Family Medicine

## 2015-03-02 ENCOUNTER — Ambulatory Visit (INDEPENDENT_AMBULATORY_CARE_PROVIDER_SITE_OTHER): Payer: Medicare Other | Admitting: Family Medicine

## 2015-03-02 VITALS — BP 116/70 | HR 66 | Temp 97.6°F | Wt 118.2 lb

## 2015-03-02 DIAGNOSIS — R05 Cough: Secondary | ICD-10-CM | POA: Diagnosis not present

## 2015-03-02 DIAGNOSIS — R634 Abnormal weight loss: Secondary | ICD-10-CM | POA: Diagnosis not present

## 2015-03-02 DIAGNOSIS — R059 Cough, unspecified: Secondary | ICD-10-CM

## 2015-03-02 MED ORDER — ALBUTEROL SULFATE HFA 108 (90 BASE) MCG/ACT IN AERS: 1.0000 | INHALATION_SPRAY | Freq: Four times a day (QID) | RESPIRATORY_TRACT | Status: DC | PRN

## 2015-03-02 MED ORDER — DOXYCYCLINE HYCLATE 100 MG PO TABS: 100.0000 mg | ORAL_TABLET | Freq: Two times a day (BID) | ORAL | Status: DC

## 2015-03-02 NOTE — Patient Instructions (Signed)
Start doxy (antibiotic) and use the inhaler if needed.  Let me know if you weight isn't going back up.  Drink more water in the meantime.  Take care.  Glad to see you.

## 2015-03-02 NOTE — Progress Notes (Signed)
Pre visit review using our clinic review tool, if applicable. No additional management support is needed unless otherwise documented below in the visit note.  He had stopped gabapentin, stopped it abruptly.  He was confused about the med directions.  He had some mood changes after that, more irritable.  That went on for about 2 weeks.  That resolved in the meantime.  He wasn't in pain then.  He was then doing better, gaining weight.    About 8 days ago, he started to feel unwell.  Started with a mild cough.  Now with yellow sputum.  Down 2 lbs from the last 2 months, overall.  That is a net change, ie with the inc and then decrease.  He didn't want to take much fluids other than coffee and Dr. Malachi Bonds.  More SOB last night.  No fevers.  No ear pain.  No ST.   SOB during coughing fits.  Some wheeze noted prev.    Meds, vitals, and allergies reviewed.   ROS: See HPI.  Otherwise, noncontributory.  Recheck pulse ox 95%.  GEN: nad, alert and oriented HEENT: mucous membranes moist, tm w/o erythema, nasal exam w/o erythema, scant clear discharge noted,  OP with cobblestoning NECK: supple w/o LA CV: rrr.   PULM: ctab, no inc wob, no rales abd soft, BIH noted, soft, not ttp.   EXT: no edema SKIN: no acute rash

## 2015-03-03 DIAGNOSIS — R05 Cough: Secondary | ICD-10-CM | POA: Insufficient documentation

## 2015-03-03 DIAGNOSIS — R059 Cough, unspecified: Secondary | ICD-10-CM | POA: Insufficient documentation

## 2015-03-03 NOTE — Assessment & Plan Note (Signed)
He was improving apparently until he got the unrelated sickness currently.  With improvement in current illness, should begin to gain weight again.  Okay for outpatient f/u.  >25 minutes spent in face to face time with patient, >50% spent in counselling or coordination of care.

## 2015-03-03 NOTE — Assessment & Plan Note (Signed)
Still okay for outpatient f/u but I would treat given his hx.  Start doxy and use SABA prn and he'll update me as needed.  This appears to be a set back in his overall recovery, but still expect him to improve and start to gain weight.  He agrees. >25 minutes spent in face to face time with patient, >50% spent in counselling or coordination of care.

## 2015-10-19 ENCOUNTER — Telehealth: Payer: Self-pay | Admitting: *Deleted

## 2015-10-19 NOTE — Telephone Encounter (Signed)
TC to PT to schedule AWV. LVM to call back.

## 2015-11-05 ENCOUNTER — Other Ambulatory Visit: Payer: Self-pay | Admitting: Family Medicine

## 2015-11-05 ENCOUNTER — Other Ambulatory Visit: Payer: Medicare Other

## 2015-11-05 ENCOUNTER — Ambulatory Visit (INDEPENDENT_AMBULATORY_CARE_PROVIDER_SITE_OTHER): Payer: Medicare Other

## 2015-11-05 VITALS — BP 110/80 | HR 60 | Temp 97.7°F | Ht 67.5 in | Wt 123.5 lb

## 2015-11-05 DIAGNOSIS — Z23 Encounter for immunization: Secondary | ICD-10-CM

## 2015-11-05 DIAGNOSIS — Z125 Encounter for screening for malignant neoplasm of prostate: Secondary | ICD-10-CM

## 2015-11-05 DIAGNOSIS — I2510 Atherosclerotic heart disease of native coronary artery without angina pectoris: Secondary | ICD-10-CM

## 2015-11-05 DIAGNOSIS — Z0000 Encounter for general adult medical examination without abnormal findings: Secondary | ICD-10-CM | POA: Diagnosis not present

## 2015-11-05 LAB — PSA, MEDICARE: PSA: 3.8 ng/mL (ref 0.10–4.00)

## 2015-11-05 LAB — COMPREHENSIVE METABOLIC PANEL
ALT: 6 U/L (ref 0–53)
AST: 11 U/L (ref 0–37)
Albumin: 4.2 g/dL (ref 3.5–5.2)
Alkaline Phosphatase: 68 U/L (ref 39–117)
BUN: 12 mg/dL (ref 6–23)
CO2: 32 meq/L (ref 19–32)
Calcium: 9 mg/dL (ref 8.4–10.5)
Chloride: 102 mEq/L (ref 96–112)
Creatinine, Ser: 0.88 mg/dL (ref 0.40–1.50)
GFR: 89.37 mL/min (ref >60.00)
GLUCOSE: 75 mg/dL (ref 70–99)
Potassium: 4.3 mEq/L (ref 3.5–5.1)
Sodium: 141 mEq/L (ref 135–145)
Total Bilirubin: 0.4 mg/dL (ref 0.2–1.2)
Total Protein: 6.8 g/dL (ref 6.0–8.3)

## 2015-11-05 LAB — LIPID PANEL
Cholesterol: 185 mg/dL (ref 0–200)
HDL: 35.4 mg/dL — AB (ref >39.00)
LDL CALC: 121 mg/dL — AB (ref 0–99)
NonHDL: 149.22
Total CHOL/HDL Ratio: 5
Triglycerides: 141 mg/dL (ref 0.0–149.0)
VLDL: 28.2 mg/dL (ref 0.0–40.0)

## 2015-11-05 NOTE — Patient Instructions (Signed)
Joseph Parker , Thank you for taking time to come for your Medicare Wellness Visit. I appreciate your ongoing commitment to your health goals. Please review the following plan we discussed and let me know if I can assist you in the future.   These are the goals we discussed: Goals    . Increase water intake          Starting 11/05/2015, I will start drinking 8 oz of water with each meal.        This is a list of the screening recommended for you and due dates:  Health Maintenance  Topic Date Due  . Tetanus Vaccine  01/11/2017  . Flu Shot  Completed  . Shingles Vaccine  Completed  . Pneumonia vaccines  Completed   Preventive Care for Adults  A healthy lifestyle and preventive care can promote health and wellness. Preventive health guidelines for adults include the following key practices.  . A routine yearly physical is a good way to check with your health care provider about your health and preventive screening. It is a chance to share any concerns and updates on your health and to receive a thorough exam.  . Visit your dentist for a routine exam and preventive care every 6 months. Brush your teeth twice a day and floss once a day. Good oral hygiene prevents tooth decay and gum disease.  . The frequency of eye exams is based on your age, health, family medical history, use  of contact lenses, and other factors. Follow your health care provider's ecommendations for frequency of eye exams.  . Eat a healthy diet. Foods like vegetables, fruits, whole grains, low-fat dairy products, and lean protein foods contain the nutrients you need without too many calories. Decrease your intake of foods high in solid fats, added sugars, and salt. Eat the right amount of calories for you. Get information about a proper diet from your health care provider, if necessary.  . Regular physical exercise is one of the most important things you can do for your health. Most adults should get at least 150  minutes of moderate-intensity exercise (any activity that increases your heart rate and causes you to sweat) each week. In addition, most adults need muscle-strengthening exercises on 2 or more days a week.  Silver Sneakers may be a benefit available to you. To determine eligibility, you may visit the website: www.silversneakers.com or contact program at 737-782-4153 Mon-Fri between 8AM-8PM.   . Maintain a healthy weight. The body mass index (BMI) is a screening tool to identify possible weight problems. It provides an estimate of body fat based on height and weight. Your health care provider can find your BMI and can help you achieve or maintain a healthy weight.   For adults 20 years and older: ? A BMI below 18.5 is considered underweight. ? A BMI of 18.5 to 24.9 is normal. ? A BMI of 25 to 29.9 is considered overweight. ? A BMI of 30 and above is considered obese.   . Maintain normal blood lipids and cholesterol levels by exercising and minimizing your intake of saturated fat. Eat a balanced diet with plenty of fruit and vegetables. Blood tests for lipids and cholesterol should begin at age 47 and be repeated every 5 years. If your lipid or cholesterol levels are high, you are over 50, or you are at high risk for heart disease, you may need your cholesterol levels checked more frequently. Ongoing high lipid and cholesterol levels should be treated  with medicines if diet and exercise are not working.  . If you smoke, find out from your health care provider how to quit. If you do not use tobacco, please do not start.  . If you choose to drink alcohol, please do not consume more than 2 drinks per day. One drink is considered to be 12 ounces (355 mL) of beer, 5 ounces (148 mL) of wine, or 1.5 ounces (44 mL) of liquor.  . If you are 53-4 years old, ask your health care provider if you should take aspirin to prevent strokes.  . Use sunscreen. Apply sunscreen liberally and repeatedly throughout  the day. You should seek shade when your shadow is shorter than you. Protect yourself by wearing long sleeves, pants, a wide-brimmed hat, and sunglasses year round, whenever you are outdoors.  . Once a month, do a whole body skin exam, using a mirror to look at the skin on your back. Tell your health care provider of new moles, moles that have irregular borders, moles that are larger than a pencil eraser, or moles that have changed in shape or color.

## 2015-11-05 NOTE — Progress Notes (Signed)
Subjective:   Joseph Parker is a 75 y.o. male who presents for Medicare Annual/Subsequent preventive examination.  Review of Systems:  N/A Cardiac Risk Factors include: advanced age (>68mn, >>29women);male gender     Objective:    Vitals: BP 110/80 (BP Location: Left Arm, Patient Position: Sitting, Cuff Size: Normal)   Pulse 60   Temp 97.7 F (36.5 C) (Oral)   Ht 5' 7.5" (1.715 m) Comment: no shoes  Wt 123 lb 8 oz (56 kg)   SpO2 96%   BMI 19.06 kg/m   Body mass index is 19.06 kg/m.  Tobacco History  Smoking Status  . Current Some Day Smoker  . Last attempt to quit: 07/20/1999  Smokeless Tobacco  . Never Used     Ready to quit: No Counseling given: No   Past Medical History:  Diagnosis Date  . Carotid arterial disease ([facility]   . [place]otid artery disease ([facility]   . Chickenpox   . Colon polyps   . COPD [place] obstructive pulmonary disease) ([facility]   . Normal cardiac stress test    06/2013  . Shortness of breath dyspnea   . Urinary frequency   . Urinary hesitancy    Past Surgical History:  Procedure Laterality Date  . COLONOSCOPY    . ESOPHAGOGASTRODUODENOSCOPY    . HERNIA REPAIR     right inquinal - as child  . LUMBAR LAMINECTOMY/DECOMPRESSION MICRODISCECTOMY Left 10/14/2014   Procedure: Left Lumbar three-four extraforaminla microdiskectomy;  Surgeon: DEustace Moore MD;  [facility];  Service: Neurosurgery;  Laterality: Left;  Left L34 extraforaminal microdiskectomy  . TONSILLECTOMY     remote - childhood   Family History  Problem Relation Age of Onset  . Heart disease Mother     CABG; Valve replacement - died post-op  . Alzheimer's disease Father   . Diabetes Father   . Diabetes Son   . Colon cancer Neg Hx   . Prostate cancer Neg Hx    History  Sexual Activity  . Sexual activity: Yes  . Birth control/ protection: Post-menopausal    Outpatient Encounter Prescriptions as of 11/05/2015  Medication Sig  . albuterol (PROVENTIL  HFA;VENTOLIN HFA) 108 (90 Base) MCG/ACT inhaler Inhale 1-2 puffs into the lungs every 6 (six) hours as needed for wheezing (or for cough).  .Marland Kitchenaspirin EC 81 MG tablet Take 1 tablet (81 mg total) by mouth daily.  . [DISCONTINUED] doxycycline (VIBRA-TABS) 100 MG tablet Take 1 tablet (100 mg total) by mouth 2 (two) times daily.   No facility-administered encounter medications on file as of 11/05/2015.     Activities of Daily Living In your present state of health, do you have any difficulty performing the following activities: 11/05/2015  Hearing? Y  Vision? N  Difficulty concentrating or making decisions? N  Walking or climbing stairs? N  Dressing or bathing? N  Doing errands, shopping? N  Preparing Food and eating ? N  Using the Toilet? N  In the past six months, have you accidently leaked urine? N  Do you have problems with loss of bowel control? N  Managing your Medications? N  Managing your Finances? N  Housekeeping or managing your Housekeeping? 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 Increase water intake          Starting 11/05/2015, I will start drinking 8 oz of water with each meal.       Fall Risk Fall Risk  11/05/2015 05/27/2014  Falls in the past year? No No   Depression Screen PHQ 2/9 Scores 11/05/2015 05/27/2014  PHQ - 2 Score 1 0    Cognitive Testing MMSE - Mini Mental State Exam 11/05/2015  Orientation to time 5  Orientation to Place 5  Registration 3  Attention/  Calculation 0  Recall 2  Recall-comments pt was unable to recall 1 of 3 words  Language- name 2 objects 0  Language- repeat 1  Language- follow 3 step command 3  Language- read & follow direction 0  Write a sentence 0  Copy design 0  Total score 19   PLEASE NOTE: A Mini-Cog screen was completed. Maximum score is 20. A value of 0 denotes this part of [place]stein MMSE was not completed or the patient failed this part of the Mini-Cog screening.   Mini-Cog Screening Orientation to Time - Max 5 pts Orientation to Place - Max 5 pts Registration - Max 3 pts Recall - Max 3 pts Language Repeat - Max 1 pts Language Follow 3 Step Command - Max 3 pts  Immunization History  Administered Date(s) Administered  . Influenza Whole 01/24/2011  . Influenza,inj,Quad PF,36+ Mos 11/05/2015  . Influenza-Unspecified 11/20/2012  . Pneumococcal Conjugate-13 05/27/2014  . Pneumococcal Polysaccharide-23 07/19/2011  . Td 01/12/2007  . Zoster 07/19/2011   Screening Tests Health Maintenance  Topic Date Due  . TETANUS/TDAP  01/11/2017  . INFLUENZA VACCINE  Completed  . ZOSTAVAX  Completed  . PNA vac Low Risk Adult  Completed      Plan:     I have personally reviewed and addressed the Medicare Annual Wellness questionnaire and have noted the following in the patient's chart:  A. Medical and social history B. Use of alcohol, tobacco or illicit drugs  C. Current medications and supplements D. Functional ability and status E.  Nutritional status F.  Physical activity G. Advance directives H. List of other physicians I.  Hospitalizations, surgeries, and ER visits in previous 12 months J.  [place] to include hearing, vision, cognitive, depression L. Referrals and appointments - none  In addition, I have reviewed and discussed with patient certain preventive protocols, quality metrics, and best practice recommendations. A written personalized care plan for preventive services as well as  general preventive health recommendations were provided to patient.  See attached scanned questionnaire for additional information.   Signed,   Lindell Noe, MHA, BS, LPN Health Advisor

## 2015-11-05 NOTE — Progress Notes (Signed)
PCP notes:   Health maintenance:  Flu vaccine - administered  Abnormal screenings:   Depression score: 1 Mini-Cog score: 19/20  Patient concerns:   1. Pt reports increased pain in right hands. Pain scale in AM: 8/10. Onset: approx.. 3 wks ago; Mgmt of pain: 3 Ibuprofen in AM. Joints on fingers are visibly swollen.   2. Pt reports chronic numbness to front of left lower leg.   3. Pt reports difficulty starting urine stream.   4. Pt has requested a handicap sticker.   Nurse concerns:  None  Next PCP appt:   11/11/15 @ 1215  11/15/15 @ 1215  I reviewed health advisor's note, was available for consultation on the day of service listed in this note, and agree with documentation and plan. [name], MD.

## 2015-11-05 NOTE — Progress Notes (Signed)
Pre visit review using our clinic review tool, if applicable. No additional management support is needed unless otherwise documented below in the visit note. 

## 2015-11-11 ENCOUNTER — Ambulatory Visit (INDEPENDENT_AMBULATORY_CARE_PROVIDER_SITE_OTHER)
Admission: RE | Admit: 2015-11-11 | Discharge: 2015-11-11 | Disposition: A | Discharge status: Home / Self Care | Payer: Medicare Other | Source: Ambulatory Visit | Attending: Family Medicine | Admitting: Family Medicine

## 2015-11-11 ENCOUNTER — Encounter: Payer: Self-pay | Admitting: Family Medicine

## 2015-11-11 ENCOUNTER — Ambulatory Visit (INDEPENDENT_AMBULATORY_CARE_PROVIDER_SITE_OTHER): Payer: Medicare Other | Admitting: Family Medicine

## 2015-11-11 VITALS — BP 122/60 | HR 57 | Temp 97.6°F | Ht 68.0 in | Wt 126.2 lb

## 2015-11-11 DIAGNOSIS — Z1211 Encounter for screening for malignant neoplasm of colon: Secondary | ICD-10-CM | POA: Diagnosis not present

## 2015-11-11 DIAGNOSIS — Z9889 Other specified postprocedural states: Secondary | ICD-10-CM

## 2015-11-11 DIAGNOSIS — M79641 Pain in right hand: Secondary | ICD-10-CM

## 2015-11-11 DIAGNOSIS — R300 Dysuria: Secondary | ICD-10-CM

## 2015-11-11 DIAGNOSIS — J439 Emphysema, unspecified: Secondary | ICD-10-CM | POA: Diagnosis not present

## 2015-11-11 DIAGNOSIS — R634 Abnormal weight loss: Secondary | ICD-10-CM

## 2015-11-11 DIAGNOSIS — M19041 Primary osteoarthritis, right hand: Secondary | ICD-10-CM | POA: Diagnosis not present

## 2015-11-11 MED ORDER — PREDNISONE 20 MG PO TABS: 20.0000 mg | ORAL_TABLET | Freq: Every day | ORAL | 0 refills | Status: DC

## 2015-11-11 MED ORDER — ALBUTEROL SULFATE HFA 108 (90 BASE) MCG/ACT IN AERS: 1.0000 | INHALATION_SPRAY | Freq: Four times a day (QID) | RESPIRATORY_TRACT | 1 refills | Status: DC | PRN

## 2015-11-11 NOTE — Patient Instructions (Addendum)
Go to the lab on the way out.  We'll contact you with your xray report. Rosaria Ferries will call about your referral. Try to see her on the way out.  Stop ibuprofen, change to prednisone with food.  Update me about your hand after finishing the prednisone.  Take care.  Glad to see you.

## 2015-11-11 NOTE — Assessment & Plan Note (Signed)
Discussed with patient about smoking cessation. This is likely more important than anything else.

## 2015-11-11 NOTE — Assessment & Plan Note (Signed)
No chest pain. Discussed with patient about smoking cessation. This is likely more important than anything else. Continue aspirin in the meantime.

## 2015-11-11 NOTE — Progress Notes (Signed)
Pre visit review using our clinic review tool, if applicable. No additional management support is needed unless otherwise documented below in the visit note. 

## 2015-11-11 NOTE — Assessment & Plan Note (Signed)
Mild paresthesia remains in the left lower leg. Not bothersome enough to need treatment. Patient is able to tolerate this. I would hope that it would slowly improve. Discussed with patient. He agrees.

## 2015-11-11 NOTE — Assessment & Plan Note (Signed)
His stream is slow to start. He likely does have some bladder neck compression or obstruction. At this point it is not bothersome enough for the patient want to start medication. He'll update me as needed.

## 2015-11-11 NOTE — Progress Notes (Signed)
1. Pt reports increased pain in right hands. Pain scale in AM: 8/10. Onset: approx.. 3 wks ago; Mgmt of pain: 3 Ibuprofen in AM. 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  Hard to make a fist.  Worst pain in the AM.  Pain at 2nd PIP with ROM, esp with flexion.  Constant pain, worse in the last few weeks.  Ibuprofen isn't helping now.  He tried taking ibuprofen at night but he was still having a lot of AM pain.  R handed.    2. Pt reports chronic numbness to front of left lower leg. Stable, he can tolerate that.  This is noted since his previous back surgery. It is not bilateral.  3. Pt reports difficulty starting urine stream.  D/w pt.  Slow to start urine but stream is then okay.  Not bothersome usually, except for some pressure on waking at night.  He thinks he can still empty fairly well.  PSA still below 4. No significant change from previous.  4. Pt has requested a handicap sticker.  He was having trouble with walking distance since his back surgery.  Given to patient at [place].   Hard of hearing, has hearing aids.    Weight loss has leveled off and he is gaining some weight.  Appetite is good.    Colonoscopy due- d/w pt.  He'll prev wanted to get this done after his leg pain was addressed.  Had seen [employer].  He is okay with talking with him again.     PMH and SH reviewed  ROS: Per HPI unless specifically indicated in ROS section   Meds, vitals, and allergies reviewed.   GEN: nad, alert and oriented HEENT: mucous membranes moist NECK: supple w/o LA CV: rrr.  no murmur PULM: ctab, no inc wob ABD: soft, +bs EXT: no edema SKIN: no acute rash Paresthesia in the L lower leg noted, with change in sensation but not absent sensation.  Slightly decreased DP pulses B but normal posterior tibial pulses B, normal cap refill in the BLE

## 2015-11-11 NOTE — Assessment & Plan Note (Signed)
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 He has some triggering and pain on the right PIP. Discussed with patient. At this point check plain films. See notes on imaging. Stop ibuprofen. Short course of prednisone with food. Routine steroid cautions discussed with patient. We can make a decision about his treatment plan after he reports back with symptoms on prednisone and after I see his films. He agrees. >25 minutes spent in face to face time with patient, >50% spent in counselling or coordination of care.

## 2015-11-11 NOTE — Assessment & Plan Note (Signed)
Weight loss has stabilized. Appetite is better in the meantime.

## 2015-11-11 NOTE — Assessment & Plan Note (Addendum)
Refer back to GI for consideration of colonoscopy versus Cologuard.

## 2015-11-17 ENCOUNTER — Telehealth: Payer: Self-pay

## 2015-11-17 NOTE — Telephone Encounter (Signed)
Thanks

## 2015-11-17 NOTE — Telephone Encounter (Signed)
Mrs Page (DPR signed) left v/m; pt was seen 11/11/15;pt has finished the 5 days of prednisone and is better but not completely OK. Pt feels fine, appetite has increased.pt is stiff in the mornings but as the day goes on stiffness is better. Pain in rt hand today is a 3 on the pain scale. Mrs Borghi wants to know what else will  Need to be done. Mrs [facility] request cb. [place].

## 2015-11-17 NOTE — Telephone Encounter (Signed)
RC-2 options.  #1. He can try to switch back ibuprofen to see if that will work better now, since the inflammation may be a little bit better with the recent prednisone use.  #2. The other option would be to have him see Dr. Lorelei Pont in clinic to see if it would be possible to inject the joints that are the most painful. That may be possible. I am not able to inject those joints.  Let me know if I need to do anything else in the meantime. If he wants to schedule with [facility], please help him with that. Thanks.

## 2015-11-17 NOTE — Telephone Encounter (Signed)
Patient's wife notified as instructed by telephone and verbalized understanding. Patient's wife stated that she did give him some ibuprofen today and it did seem to help some. [name] stated that she does not know that he will do good with the injections, but will see how he continues to do with the ibuprofen and will call back and schedule an appointment with Dr. Lorelei Pont if patient thinks that he wants to do this.

## 2015-11-19 ENCOUNTER — Telehealth: Payer: Self-pay | Admitting: Family Medicine

## 2015-11-19 ENCOUNTER — Encounter: Payer: Self-pay | Admitting: Internal Medicine

## 2015-11-19 NOTE — Telephone Encounter (Signed)
Patient Name: Joseph Parker DOB: June 12, 1940 Initial Comment Caller states her husband had a check up last Thursday. He's having pain in his right hand. Nurse Assessment Nurse: Ronnald Ramp, RN, Miranda Date/Time (Eastern Time): 11/19/2015 11:31:16 AM Confirm and document reason for call. If symptomatic, describe symptoms. You must click the next button to save text entered. ---Caller states her husband is having pain in his right hand. He was diagnosed with arthritis last week after x-ray. He was prescribed 5 days of prednisone. Symptoms had improved but getting worse again. Has the patient traveled out of the country within the last 30 days? ---Not Applicable Does the patient have any new or worsening symptoms? ---Yes Will a triage be completed? ---Yes Related visit to physician within the last 2 weeks? ---No Does the PT have any chronic conditions? (i.e. diabetes, asthma, etc.) ---No Is this a behavioral health or substance abuse call? ---No Guidelines Guideline Title Affirmed Question Affirmed Notes Hand and Wrist Pain [1] MODERATE pain (e.g., interferes with normal activities) AND [2] present > 3 days Final Disposition User See PCP When Office is Open (within 3 days) Ronnald Ramp, RN, Miranda Comments Recommended that pt take Iburpofen round the clock and not wait for sever pain. She asked that a message be sent to Dr. Damita Dunnings to please contact them to discuss further options. Told caller there was a note that the MD suggested seeing Dr. Edilia Bo, but caller states the pt did not want to do that at this time. Disagree/Comply: Comply

## 2015-11-19 NOTE — Telephone Encounter (Signed)
Please notify patient that Dr. Damita Dunnings is out of the office today and will return Monday next week. We will make sure he receives this message. Agree with ibuprofen, renal function stable.

## 2015-11-19 NOTE — Telephone Encounter (Signed)
Per DPR, left detail message of kate's comments on patient's home voicemail.

## 2015-11-21 NOTE — Telephone Encounter (Signed)
Would rec seeing [facility] if not better.  Thanks.

## 2015-11-22 NOTE — Telephone Encounter (Signed)
Left detailed message on voicemail.  

## 2016-01-07 ENCOUNTER — Ambulatory Visit (AMBULATORY_SURGERY_CENTER): Payer: Self-pay | Admitting: *Deleted

## 2016-01-07 VITALS — Ht 68.5 in | Wt 126.0 lb

## 2016-01-07 DIAGNOSIS — Z800 Family history of malignant neoplasm of digestive organs: Secondary | ICD-10-CM

## 2016-01-07 NOTE — Progress Notes (Signed)
No egg or soy allergy known to patient  No issues with past sedation with any surgeries  or procedures, no intubation problems  No diet pills per patient No home 02 use per patient  No blood thinners per patient  Pt denies issues with constipation  No A fib or A flutter    With wife in [place] today

## 2016-01-09 ENCOUNTER — Encounter: Payer: Self-pay | Admitting: Emergency Medicine

## 2016-01-09 ENCOUNTER — Observation Stay
Admission: EM | Admit: 2016-01-09 | Discharge: 2016-01-11 | Disposition: A | Discharge status: Home / Self Care | Payer: Medicare Other | Attending: Internal Medicine | Admitting: Internal Medicine

## 2016-01-09 ENCOUNTER — Emergency Department: Payer: Medicare Other

## 2016-01-09 DIAGNOSIS — Z880 Allergy status to penicillin: Secondary | ICD-10-CM | POA: Diagnosis not present

## 2016-01-09 DIAGNOSIS — Z681 Body mass index (BMI) 19 or less, adult: Secondary | ICD-10-CM | POA: Diagnosis not present

## 2016-01-09 DIAGNOSIS — R0602 Shortness of breath: Secondary | ICD-10-CM | POA: Diagnosis not present

## 2016-01-09 DIAGNOSIS — Z888 Allergy status to other drugs, medicaments and biological substances status: Secondary | ICD-10-CM | POA: Diagnosis not present

## 2016-01-09 DIAGNOSIS — E43 Unspecified severe protein-calorie malnutrition: Secondary | ICD-10-CM | POA: Diagnosis not present

## 2016-01-09 DIAGNOSIS — I739 Peripheral vascular disease, unspecified: Secondary | ICD-10-CM | POA: Diagnosis not present

## 2016-01-09 DIAGNOSIS — R350 Frequency of micturition: Secondary | ICD-10-CM | POA: Insufficient documentation

## 2016-01-09 DIAGNOSIS — J441 Chronic obstructive pulmonary disease with (acute) exacerbation: Principal | ICD-10-CM | POA: Diagnosis present

## 2016-01-09 DIAGNOSIS — M7630 Iliotibial band syndrome, unspecified leg: Secondary | ICD-10-CM | POA: Insufficient documentation

## 2016-01-09 DIAGNOSIS — I7789 Other specified disorders of arteries and arterioles: Secondary | ICD-10-CM | POA: Insufficient documentation

## 2016-01-09 DIAGNOSIS — Z86010 Personal history of colonic polyps: Secondary | ICD-10-CM | POA: Insufficient documentation

## 2016-01-09 DIAGNOSIS — Z7982 Long term (current) use of aspirin: Secondary | ICD-10-CM | POA: Insufficient documentation

## 2016-01-09 DIAGNOSIS — R05 Cough: Secondary | ICD-10-CM | POA: Diagnosis not present

## 2016-01-09 DIAGNOSIS — F17200 Nicotine dependence, unspecified, uncomplicated: Secondary | ICD-10-CM | POA: Diagnosis not present

## 2016-01-09 DIAGNOSIS — I2510 Atherosclerotic heart disease of native coronary artery without angina pectoris: Secondary | ICD-10-CM | POA: Diagnosis not present

## 2016-01-09 DIAGNOSIS — Z79899 Other long term (current) drug therapy: Secondary | ICD-10-CM | POA: Diagnosis not present

## 2016-01-09 LAB — COMPREHENSIVE METABOLIC PANEL
ALBUMIN: 4.4 g/dL (ref 3.5–5.0)
ALT: 9 U/L — ABNORMAL LOW (ref 17–63)
ANION GAP: 8 (ref 5–15)
AST: 19 U/L (ref 15–41)
Alkaline Phosphatase: 67 U/L (ref 38–126)
BUN: 8 mg/dL (ref 6–20)
CHLORIDE: 102 mmol/L (ref 101–111)
CO2: 25 mmol/L (ref 22–32)
Calcium: 9.1 mg/dL (ref 8.9–10.3)
Creatinine, Ser: 0.8 mg/dL (ref 0.61–1.24)
GFR calc Af Amer: >60 mL/min (ref >60)
GLUCOSE: 135 mg/dL — AB (ref 65–99)
POTASSIUM: 3.6 mmol/L (ref 3.5–5.1)
Sodium: 135 mmol/L (ref 135–145)
Total Bilirubin: 1.1 mg/dL (ref 0.3–1.2)
Total Protein: 7.6 g/dL (ref 6.5–8.1)

## 2016-01-09 LAB — CBC WITH DIFFERENTIAL/PLATELET
Basophils Absolute: 0 10*3/uL (ref 0–0.1)
Basophils Relative: 0 %
EOS PCT: 0 %
Eosinophils Absolute: 0.1 10*3/uL (ref 0–0.7)
HEMATOCRIT: 42.3 % (ref 40.0–52.0)
Hemoglobin: 14.9 g/dL (ref 13.0–18.0)
LYMPHS ABS: 1.5 10*3/uL (ref 1.0–3.6)
LYMPHS PCT: 9 %
MCH: 30.6 pg (ref 26.0–34.0)
MCHC: 35.2 g/dL (ref 32.0–36.0)
MCV: 86.8 fL (ref 80.0–100.0)
Monocytes Absolute: 1.6 10*3/uL — ABNORMAL HIGH (ref 0.2–1.0)
Monocytes Relative: 9 %
NEUTROS ABS: 14.3 10*3/uL — AB (ref 1.4–6.5)
Neutrophils Relative %: 82 %
PLATELETS: 241 10*3/uL (ref 150–440)
RBC: 4.87 MIL/uL (ref 4.40–5.90)
RDW: 13.5 % (ref 11.5–14.5)
WBC: 17.6 10*3/uL — AB (ref 3.8–10.6)

## 2016-01-09 LAB — TROPONIN I

## 2016-01-09 MED ORDER — AZITHROMYCIN 500 MG PO TABS: 500.0000 mg | ORAL_TABLET | Freq: Once | ORAL | Status: DC

## 2016-01-09 MED ORDER — GUAIFENESIN-DM 100-10 MG/5ML PO SYRP: 5.0000 mL | ORAL_SOLUTION | ORAL | Status: DC | PRN

## 2016-01-09 MED ORDER — ACETAMINOPHEN 325 MG PO TABS: 650.0000 mg | ORAL_TABLET | Freq: Four times a day (QID) | ORAL | Status: DC | PRN

## 2016-01-09 MED ORDER — IPRATROPIUM-ALBUTEROL 0.5-2.5 (3) MG/3ML IN SOLN
3.0000 mL | Freq: Once | RESPIRATORY_TRACT | Status: AC
Start: 2016-01-09 — End: 2016-01-09
  Administered 2016-01-09: 3 mL via RESPIRATORY_TRACT
  Filled 2016-01-09: qty 3

## 2016-01-09 MED ORDER — ONDANSETRON HCL 4 MG/2ML IJ SOLN: 4.0000 mg | Freq: Four times a day (QID) | INTRAMUSCULAR | Status: DC | PRN

## 2016-01-09 MED ORDER — ASPIRIN EC 81 MG PO TBEC
81.0000 mg | DELAYED_RELEASE_TABLET | Freq: Every day | ORAL | Status: DC
Start: 2016-01-10 — End: 2016-01-11
  Administered 2016-01-10 – 2016-01-11 (×2): 81 mg via ORAL
  Filled 2016-01-09 (×2): qty 1

## 2016-01-09 MED ORDER — IPRATROPIUM-ALBUTEROL 0.5-2.5 (3) MG/3ML IN SOLN: 3.0000 mL | RESPIRATORY_TRACT | Status: DC | PRN

## 2016-01-09 MED ORDER — ACETAMINOPHEN 650 MG RE SUPP: 650.0000 mg | Freq: Four times a day (QID) | RECTAL | Status: DC | PRN

## 2016-01-09 MED ORDER — SODIUM CHLORIDE 0.9% FLUSH
3.0000 mL | Freq: Two times a day (BID) | INTRAVENOUS | Status: DC
  Administered 2016-01-09 – 2016-01-11 (×4): 3 mL via INTRAVENOUS

## 2016-01-09 MED ORDER — ENOXAPARIN SODIUM 40 MG/0.4ML ~~LOC~~ SOLN
40.0000 mg | SUBCUTANEOUS | Status: DC
  Administered 2016-01-10: 22:00:00 40 mg via SUBCUTANEOUS
  Filled 2016-01-09: qty 0.4

## 2016-01-09 MED ORDER — ONDANSETRON HCL 4 MG PO TABS: 4.0000 mg | ORAL_TABLET | Freq: Four times a day (QID) | ORAL | Status: DC | PRN

## 2016-01-09 MED ORDER — AZITHROMYCIN 500 MG PO TABS
500.0000 mg | ORAL_TABLET | Freq: Every day | ORAL | Status: DC
  Administered 2016-01-09 – 2016-01-11 (×3): 500 mg via ORAL
  Filled 2016-01-09 (×3): qty 1

## 2016-01-09 MED ORDER — METHYLPREDNISOLONE SODIUM SUCC 125 MG IJ SOLR
125.0000 mg | Freq: Once | INTRAMUSCULAR | Status: AC
  Administered 2016-01-09: 125 mg via INTRAVENOUS
  Filled 2016-01-09: qty 2

## 2016-01-09 MED ORDER — IPRATROPIUM-ALBUTEROL 0.5-2.5 (3) MG/3ML IN SOLN
3.0000 mL | Freq: Once | RESPIRATORY_TRACT | Status: AC
  Administered 2016-01-09: 3 mL via RESPIRATORY_TRACT
  Filled 2016-01-09: qty 3

## 2016-01-09 MED ORDER — METHYLPREDNISOLONE SODIUM SUCC 125 MG IJ SOLR
60.0000 mg | Freq: Four times a day (QID) | INTRAMUSCULAR | Status: DC
  Administered 2016-01-10 (×3): 60 mg via INTRAVENOUS
  Filled 2016-01-09 (×3): qty 2

## 2016-01-09 MED ORDER — ALBUTEROL SULFATE (2.5 MG/3ML) 0.083% IN NEBU: 5.0000 mg | INHALATION_SOLUTION | Freq: Once | RESPIRATORY_TRACT | Status: DC

## 2016-01-09 NOTE — H&P (Signed)
[facility] [place] NAME: Joseph Parker    MR#:  [id number]  DATE OF BIRTH:  05/12/1940  DATE OF ADMISSION:  01/09/2016  PRIMARY CARE PHYSICIAN: [name], MD   REQUESTING/REFERRING PHYSICIAN: Kerman Passey, MD  CHIEF COMPLAINT:   Chief Complaint  Patient presents with  . Shortness of Breath    HISTORY OF PRESENT ILLNESS:  Joseph Parker  is a 75 y.o. male who presents with 1 week of progressive cough and shortness of breath with sputum production. Patient has a history of COPD. He states that he has had decreased appetite for about a week, as well as decreased ability to ambulate without having to stop due to shortness of breath. Here in the ED he was found to have leukocytosis, but no evidence of pneumonia on chest x-ray. He did have significant wheezing and mild hypoxia requiring oxygen via nasal cannula. Hospitals were called for admission.  PAST MEDICAL HISTORY:   Past Medical History:  Diagnosis Date  . Carotid arterial disease ([place])   . Carotid artery disease ([place])   . Chickenpox   . Colon polyps   . COPD (chronic obstructive pulmonary disease) ([place])   . Normal cardiac stress test    06/2013  . Shortness of breath dyspnea   . Urinary frequency   . Urinary hesitancy     PAST SURGICAL HISTORY:   Past Surgical History:  Procedure Laterality Date  . COLONOSCOPY    . ESOPHAGOGASTRODUODENOSCOPY    . HERNIA REPAIR     right inquinal - as child  . LUMBAR LAMINECTOMY/DECOMPRESSION MICRODISCECTOMY Left 10/14/2014   Procedure: Left Lumbar three-four extraforaminla microdiskectomy;  Surgeon: Eustace Moore, MD;  Location: [facility];  Service: Neurosurgery;  Laterality: Left;  Left L34 extraforaminal microdiskectomy  . POLYPECTOMY    . TONSILLECTOMY     remote - childhood  . UPPER GASTROINTESTINAL ENDOSCOPY      SOCIAL HISTORY:   Social History  Substance Use Topics  . Smoking status: Current Some Day Smoker     Packs/day: 0.50    Last attempt to quit: 07/20/1999  . Smokeless tobacco: Never Used  . Alcohol use No    FAMILY HISTORY:   Family History  Problem Relation Age of Onset  . Heart disease Mother     CABG; Valve replacement - died post-op  . Alzheimer's disease Father   . Diabetes Father   . Diabetes Son   . Colon cancer Sister   . Prostate cancer Neg Hx   . Colon polyps Neg Hx     DRUG ALLERGIES:   Allergies  Allergen Reactions  . Penicillins Itching, Swelling and Rash  . Tessalon [Benzonatate] Swelling    Had wide-spread numbness, throat swelling    MEDICATIONS AT HOME:   Prior to Admission medications   Medication Sig Start Date End Date Taking? Authorizing Provider  albuterol (PROVENTIL HFA;VENTOLIN HFA) 108 (90 Base) MCG/ACT inhaler Inhale 1-2 puffs into the lungs every 6 (six) hours as needed for wheezing (or for cough). 11/11/15   Tonia Ghent, MD  aspirin EC 81 MG tablet Take 1 tablet (81 mg total) by mouth daily. 06/11/13   Belva Crome, MD    REVIEW OF SYSTEMS:  Review of Systems  Constitutional: Positive for malaise/fatigue. Negative for chills, fever and weight loss.  HENT: Negative for ear pain, hearing loss and tinnitus.   Eyes: Negative for blurred vision, double vision, pain and redness.  Respiratory: Positive  for cough, sputum production, shortness of breath and wheezing. Negative for hemoptysis.   Cardiovascular: Negative for chest pain, palpitations, orthopnea and leg swelling.  Gastrointestinal: Negative for abdominal pain, constipation, diarrhea, nausea and vomiting.  Genitourinary: Negative for dysuria, frequency and hematuria.  Musculoskeletal: Negative for back pain, joint pain and neck pain.  Skin:       No acne, rash, or lesions  Neurological: Negative for dizziness, tremors, focal weakness and weakness.  Endo/Heme/Allergies: Negative for polydipsia. Does not bruise/bleed easily.  Psychiatric/Behavioral: Negative for depression. The  patient is not nervous/anxious and does not have insomnia.      VITAL SIGNS:   Vitals:   01/09/16 1908 01/09/16 1909 01/09/16 1930  BP: (!) 179/84  (!) 143/89  Pulse: 82  79  Resp:   (!) 31  Temp: 98.5 F (36.9 C)    TempSrc: Oral    SpO2: 90%  94%  Weight:  57.2 kg (126 lb)   Height:  '5\' 9"'$  (1.753 m)    Wt Readings from Last 3 Encounters:  01/09/16 57.2 kg (126 lb)  01/07/16 57.2 kg (126 lb)  11/11/15 57.3 kg (126 lb 4 oz)    PHYSICAL EXAMINATION:  Physical Exam  Vitals reviewed. Constitutional: He is oriented to person, place, and time. He appears well-developed and well-nourished. No distress.  HENT:  Head: Normocephalic and atraumatic.  Mouth/Throat: Oropharynx is clear and moist.  Eyes: Conjunctivae and EOM are normal. Pupils are equal, round, and reactive to light. No scleral icterus.  Neck: Normal range of motion. Neck supple. No JVD present. No thyromegaly present.  Cardiovascular: Normal rate, regular rhythm and intact distal pulses.  Exam reveals no gallop and no friction rub.   No murmur heard. Respiratory: Effort normal. No respiratory distress. He has wheezes. He has no rales.  GI: Soft. Bowel sounds are normal. He exhibits no distension. There is no tenderness.  Musculoskeletal: Normal range of motion. He exhibits no edema.  No arthritis, no gout  Lymphadenopathy:    He has no cervical adenopathy.  Neurological: He is alert and oriented to person, place, and time. No cranial nerve deficit.  No dysarthria, no aphasia  Skin: Skin is warm and dry. No rash noted. No erythema.  Psychiatric: He has a normal mood and affect. His behavior is normal. Judgment and thought content normal.    LABORATORY PANEL:   CBC  Recent Labs Lab 01/09/16 1912  WBC 17.6*  HGB 14.9  HCT 42.3  PLT 241   ------------------------------------------------------------------------------------------------------------------  Chemistries   Recent Labs Lab 01/09/16 1912  NA  135  K 3.6  CL 102  CO2 25  GLUCOSE 135*  BUN 8  CREATININE 0.80  CALCIUM 9.1  AST 19  ALT 9*  ALKPHOS 67  BILITOT 1.1   ------------------------------------------------------------------------------------------------------------------  Cardiac Enzymes  Recent Labs Lab 01/09/16 1912  TROPONINI <0.03   ------------------------------------------------------------------------------------------------------------------  RADIOLOGY:  Dg Chest Portable 1 View  Result Date: 01/09/2016 CLINICAL DATA:  Acute onset of shortness of breath, worse with exertion. Productive cough. Initial encounter. EXAM: PORTABLE CHEST 1 VIEW COMPARISON:  Chest radiograph performed 04/07/2014, and CT of the chest performed 06/11/2014 FINDINGS: The known right upper lobe nodule is not definitely characterized on radiograph. The lungs are hyperexpanded, with flattening of the hemidiaphragms, compatible with COPD. No pleural effusion or pneumothorax is seen. The cardiomediastinal silhouette remains normal in size. No acute osseous abnormalities are seen. IMPRESSION: Findings of COPD.  Lungs otherwise grossly clear. Electronically Signed   By: Jacqulynn Cadet  Chang M.D.   On: 01/09/2016 19:39    EKG:   Orders placed or performed during the hospital encounter of 01/09/16  . ED EKG  . ED EKG  . EKG 12-Lead  . EKG 12-Lead    IMPRESSION AND PLAN:  Principal Problem:   COPD exacerbation (HCC) - IV Solu-Medrol, when necessary nebs, azithromycin, when necessary antitussive. Active Problems:   CAD (coronary artery disease), native coronary artery - continue home meds  All the records are reviewed and case discussed with ED provider. Management plans discussed with the patient and/or family.  DVT PROPHYLAXIS: SubQ lovenox  GI PROPHYLAXIS: None  ADMISSION STATUS: Observation  CODE STATUS: Full Code Status History    Date Active Date Inactive Code Status Order ID Comments User Context   10/14/2014  3:24 PM  10/15/2014  4:51 PM Full Code [id number]  Eustace Moore, MD Inpatient      TOTAL TIME TAKING CARE OF THIS PATIENT: 40 minutes.    Traniece Boffa FIELDING 01/09/2016, 9:13 PM  [facility]  Office  [phone]  CC: Primary care physician; [name], MD

## 2016-01-09 NOTE — Care Management Obs Status (Signed)
[facility] NOTIFICATION   Patient Details  Name: Joseph Parker: [id number] Date of Birth: 1940/09/25   Medicare Observation Status Notification Given:  Yes    CrutchfieldAntony Haste, RN 01/09/2016, 9:32 PM

## 2016-01-09 NOTE — ED Triage Notes (Signed)
Pt via POV to ED c/o SOB, worse with exertion, COPD and smoking hx.  Pt reports productive cough with "yellow stuff".  Has PROAIR inhaler used at home but reports unsure how to use it.

## 2016-01-09 NOTE — ED Provider Notes (Signed)
[facility] Emergency Department Provider Note  Time seen: 7:31 PM  I have reviewed the triage vital signs and the nursing notes.   HISTORY  Chief Complaint Shortness of Breath    HPI Joseph Parker is a 75 y.o. male with a past medical history of COPD who presents to the emergency department with shortness of breath. According to the patient the past 2 weeks he has had progressively worsening shortness of breath. Patient is a daily smoker. Does not wear oxygen at home. Patient states last week he had an upper respiratory infection, he states his wife had it as well and she is gotten better but he has not. He continues with cough with occasional white sputum production. Denies fever. Describes her shortness breath as moderate. Much worse with any exertion.  Past Medical History:  Diagnosis Date  . Carotid arterial disease ([place])   . Carotid artery disease ([place])   . Chickenpox   . Colon polyps   . COPD (chronic obstructive pulmonary disease) ([place])   . Normal cardiac stress test    06/2013  . Shortness of breath dyspnea   . Urinary frequency   . Urinary hesitancy     Patient Active Problem List   Diagnosis Date Noted  . Colon cancer screening 11/11/2015  . Right hand pain 11/11/2015  . Cough 03/03/2015  . Loss of weight 01/06/2015  . Dysuria 10/29/2014  . S/P lumbar microdiscectomy 10/14/2014  . Advance care planning 05/27/2014  . Iliotibial band syndrome 04/07/2014  . CAD (coronary artery disease), native coronary artery 06/11/2013  . Decreased exercise tolerance 06/10/2013  . Nodule of right lung 10/25/2011  . Emphysema lung ([place]) 07/20/2011  . Medicare annual wellness visit, initial 07/20/2011    Past Surgical History:  Procedure Laterality Date  . COLONOSCOPY    . ESOPHAGOGASTRODUODENOSCOPY    . HERNIA REPAIR     right inquinal - as child  . LUMBAR LAMINECTOMY/DECOMPRESSION MICRODISCECTOMY Left 10/14/2014   Procedure: Left Lumbar  three-four extraforaminla microdiskectomy;  Surgeon: Eustace Moore, MD;  Location: [facility];  Service: Neurosurgery;  Laterality: Left;  Left L34 extraforaminal microdiskectomy  . POLYPECTOMY    . TONSILLECTOMY     remote - childhood  . UPPER GASTROINTESTINAL ENDOSCOPY      Prior to Admission medications   Medication Sig Start Date End Date Taking? Authorizing Provider  albuterol (PROVENTIL HFA;VENTOLIN HFA) 108 (90 Base) MCG/ACT inhaler Inhale 1-2 puffs into the lungs every 6 (six) hours as needed for wheezing (or for cough). 11/11/15   Tonia Ghent, MD  aspirin EC 81 MG tablet Take 1 tablet (81 mg total) by mouth daily. 06/11/13   Belva Crome, MD    Allergies  Allergen Reactions  . Penicillins Itching, Swelling and Rash  . Tessalon [Benzonatate] Swelling    Had wide-spread numbness, throat swelling    Family History  Problem Relation Age of Onset  . Heart disease Mother     CABG; Valve replacement - died post-op  . Alzheimer's disease Father   . Diabetes Father   . Diabetes Son   . Colon cancer Sister   . Prostate cancer Neg Hx   . Colon polyps Neg Hx     Social History Social History  Substance Use Topics  . Smoking status: Current Some Day Smoker    Packs/day: 0.50    Last attempt to quit: 07/20/1999  . Smokeless tobacco: Never Used  . Alcohol use No  Review of Systems Constitutional: Negative for fever Cardiovascular: Negative for chest pain. Respiratory: Positive for shortness of breath. Positive for cough. Gastrointestinal: Negative for abdominal pain Neurological: Negative for headache 10-point ROS otherwise negative.  ____________________________________________   PHYSICAL EXAM:  VITAL SIGNS: ED Triage Vitals  Enc Vitals Group     BP 01/09/16 1908 (!) 179/84     Pulse Rate 01/09/16 1908 82     Resp --      Temp 01/09/16 1908 98.5 F (36.9 C)     Temp Source 01/09/16 1908 Oral     SpO2 01/09/16 1908 90 %     Weight 01/09/16 1909 126 lb  (57.2 kg)     Height 01/09/16 1909 '5\' 9"'$  (1.753 m)     Head Circumference --      Peak Flow --      Pain Score --      Pain Loc --      Pain Edu? --      Excl. in [place]? --     Constitutional: Alert and oriented. Well appearing and in no distress. Eyes: Normal exam ENT   Head: Normocephalic and atraumatic   Mouth/Throat: Mucous membranes are moist. Cardiovascular: Normal rate, regular rhythm. No murmur Respiratory: Mild tachypnea around 25 breast per minute. Diffuse expiratory wheeze. No rales or rhonchi. Gastrointestinal: Soft and nontender. No distention.  Musculoskeletal: Nontender with normal range of motion in all extremities. No lower extremity tenderness or edema. Neurologic:  Normal speech and language. No gross focal neurologic deficits  Skin:  Skin is warm, dry and intact.  Psychiatric: Mood and affect are normal.   ____________________________________________    EKG  EKG reviewed and interpreted, self appears to show normal sinus rhythm at 81 bpm, narrow QRS, normal axis, largely normal intervals with nonspecific but no concerning ST changes.  ____________________________________________    RADIOLOGY  Chest x-ray clear consistent with COPD.  ____________________________________________   INITIAL IMPRESSION / ASSESSMENT AND PLAN / ED COURSE  Pertinent labs & imaging results that were available during my care of the patient were reviewed by me and considered in my medical decision making (see chart for details).  Patient presents to emergency department with difficulty breathing over the past 2 weeks much worse over the past several days. Patient has a room air saturation around 88-90 percent. Placed on 2 L of oxygen. Patient has expiratory wheeze bilaterally. We will start on DuoNeb's, Solu-Medrol, continue to closely monitor in the emergency department.  Patient's white blood cell count is 17,000. Overall chest x-ray is clear. Given the patient's cough,  sputum production with hypoxia 88-90 percent on room air we'll admit to the hospital for further treatment. We'll cover with Zithromax. ____________________________________________   FINAL CLINICAL IMPRESSION(S) / ED DIAGNOSES  COPD exacerbation    Harvest Dark, MD 01/09/16 2108

## 2016-01-10 DIAGNOSIS — J441 Chronic obstructive pulmonary disease with (acute) exacerbation: Secondary | ICD-10-CM | POA: Diagnosis not present

## 2016-01-10 DIAGNOSIS — Z720 Tobacco use: Secondary | ICD-10-CM | POA: Diagnosis not present

## 2016-01-10 DIAGNOSIS — I2510 Atherosclerotic heart disease of native coronary artery without angina pectoris: Secondary | ICD-10-CM | POA: Diagnosis not present

## 2016-01-10 LAB — BASIC METABOLIC PANEL
ANION GAP: 6 (ref 5–15)
BUN: 10 mg/dL (ref 6–20)
CALCIUM: 8.9 mg/dL (ref 8.9–10.3)
CO2: 27 mmol/L (ref 22–32)
Chloride: 103 mmol/L (ref 101–111)
Creatinine, Ser: 0.81 mg/dL (ref 0.61–1.24)
Glucose, Bld: 183 mg/dL — ABNORMAL HIGH (ref 65–99)
POTASSIUM: 4.3 mmol/L (ref 3.5–5.1)
Sodium: 136 mmol/L (ref 135–145)

## 2016-01-10 LAB — CBC
HEMATOCRIT: 38.5 % — AB (ref 40.0–52.0)
HEMOGLOBIN: 13.6 g/dL (ref 13.0–18.0)
MCH: 30.4 pg (ref 26.0–34.0)
MCHC: 35.3 g/dL (ref 32.0–36.0)
MCV: 86.3 fL (ref 80.0–100.0)
Platelets: 191 10*3/uL (ref 150–440)
RBC: 4.46 MIL/uL (ref 4.40–5.90)
RDW: 13.6 % (ref 11.5–14.5)
WBC: 12.5 10*3/uL — AB (ref 3.8–10.6)

## 2016-01-10 MED ORDER — NICOTINE 21 MG/24HR TD PT24
21.0000 mg | MEDICATED_PATCH | Freq: Every day | TRANSDERMAL | Status: DC
  Administered 2016-01-10 – 2016-01-11 (×2): 21 mg via TRANSDERMAL
  Filled 2016-01-10 (×2): qty 1

## 2016-01-10 MED ORDER — METHYLPREDNISOLONE SODIUM SUCC 125 MG IJ SOLR
60.0000 mg | Freq: Every day | INTRAMUSCULAR | Status: DC
  Administered 2016-01-11: 09:00:00 60 mg via INTRAVENOUS
  Filled 2016-01-10: qty 2

## 2016-01-10 MED ORDER — ALBUTEROL SULFATE (2.5 MG/3ML) 0.083% IN NEBU: 2.5000 mg | INHALATION_SOLUTION | RESPIRATORY_TRACT | Status: DC | PRN

## 2016-01-10 MED ORDER — TIOTROPIUM BROMIDE MONOHYDRATE 18 MCG IN CAPS
18.0000 ug | ORAL_CAPSULE | Freq: Every day | RESPIRATORY_TRACT | Status: DC
  Administered 2016-01-10 – 2016-01-11 (×2): 18 ug via RESPIRATORY_TRACT
  Filled 2016-01-10: qty 5

## 2016-01-10 NOTE — Progress Notes (Signed)
Startup at [facility]                                                                                                                                                                                            Patient Demographics   Joseph Parker, is a 75 y.o. male, DOB - 1940-12-20, DUK:[id number]  Admit date - 01/09/2016   Admitting Physician Lance Coon, MD  Outpatient Primary MD for the patient is [name], MD   LOS - 0  Subjective: Patient states that his breathing is somewhat improved. He reports that he is not chronically on oxygen. Denies any chest pain Reports wheezing is improved    Review of Systems:   CONSTITUTIONAL: No documented fever. No fatigue, weakness. No weight gain, no weight loss.  EYES: No blurry or double vision.  ENT: No tinnitus. No postnasal drip. No redness of the oropharynx.  RESPIRATORY: No cough, Positive wheeze, no hemoptysis. Positive dyspnea.  CARDIOVASCULAR: No chest pain. No orthopnea. No palpitations. No syncope.  GASTROINTESTINAL: No nausea, no vomiting or diarrhea. No abdominal pain. No melena or hematochezia.  GENITOURINARY: No dysuria or hematuria.  ENDOCRINE: No polyuria or nocturia. No heat or cold intolerance.  HEMATOLOGY: No anemia. No bruising. No bleeding.  INTEGUMENTARY: No rashes. No lesions.  MUSCULOSKELETAL: No arthritis. No swelling. No gout.  NEUROLOGIC: No numbness, tingling, or ataxia. No seizure-type activity.  PSYCHIATRIC: No anxiety. No insomnia. No ADD.    Vitals:   Vitals:   01/09/16 2204 01/09/16 2240 01/09/16 2248 01/10/16 0516  BP: 122/66  128/62 110/61  Pulse: 90  77 62  Resp: 20     Temp:   97.6 F (36.4 C) 97.7 F (36.5 C)  TempSrc:   Oral Oral  SpO2: 91%   95%  Weight:  118 lb (53.5 kg)    Height:  '5\' 7"'$  (1.702 m)      Wt Readings from Last 3 Encounters:  01/09/16 118 lb (53.5 kg)  01/07/16 126 lb (57.2 kg)  11/11/15 126 lb 4 oz (57.3 kg)      Intake/Output Summary (Last 24 hours) at 01/10/16 1253 Last data filed at 01/10/16 1025  Gross per 24 hour  Intake              240 ml  Output                0 ml  Net              240 ml    Physical Exam:   GENERAL: Pleasant-appearing in no apparent distress.  HEAD, EYES, EARS, NOSE  AND THROAT: Atraumatic, normocephalic. Extraocular muscles are intact. Pupils equal and reactive to light. Sclerae anicteric. No conjunctival injection. No oro-pharyngeal erythema.  NECK: Supple. There is no jugular venous distention. No bruits, no lymphadenopathy, no thyromegaly.  HEART: Regular rate and rhythm,. No murmurs, no rubs, no clicks.  LUNGS: occ wheezing b/l. No rales or rhonchi. No accesory muscle usage  ABDOMEN: Soft, flat, nontender, nondistended. Has good bowel sounds. No hepatosplenomegaly appreciated.  EXTREMITIES: No evidence of any cyanosis, clubbing, or peripheral edema.  +2 pedal and radial pulses bilaterally.  NEUROLOGIC: The patient is alert, awake, and oriented x3 with no focal motor or sensory deficits appreciated bilaterally.  SKIN: Moist and warm with no rashes appreciated.  Psych: Not anxious, depressed LN: No inguinal LN enlargement    Antibiotics   Anti-infectives    Start     Dose/Rate Route Frequency Ordered Stop   01/09/16 2115  azithromycin [facility]) tablet 500 mg  Status:  Discontinued     500 mg Oral  Once 01/09/16 2108 01/09/16 2111   01/09/16 2115  azithromycin (ZITHROMAX) tablet 500 mg     500 mg Oral Daily 01/09/16 2111        Medications   Scheduled Meds: . aspirin EC  81 mg Oral Daily  . azithromycin  500 mg Oral Daily  . enoxaparin (LOVENOX) injection  40 mg Subcutaneous Q24H  . methylPREDNISolone (SOLU-MEDROL) injection  60 mg Intravenous Q6H  . nicotine  21 mg Transdermal Daily  . sodium chloride flush  3 mL Intravenous Q12H  . tiotropium  18 mcg Inhalation Daily   Continuous Infusions: PRN Meds:.acetaminophen **OR** acetaminophen,  guaiFENesin-dextromethorphan, ipratropium-albuterol, ondansetron **OR** ondansetron (ZOFRAN) IV   Data Review:   Micro Results No results found for this or any previous visit (from the past 240 hour(s)).  Radiology Reports Dg Chest Portable 1 View  Result Date: 01/09/2016 CLINICAL DATA:  Acute onset of shortness of breath, worse with exertion. Productive cough. Initial encounter. EXAM: PORTABLE CHEST 1 VIEW COMPARISON:  Chest radiograph performed 04/07/2014, and CT of the chest performed 06/11/2014 FINDINGS: The known right upper lobe nodule is not definitely characterized on radiograph. The lungs are hyperexpanded, with flattening of the hemidiaphragms, compatible with COPD. No pleural effusion or pneumothorax is seen. The cardiomediastinal silhouette remains normal in size. No acute osseous abnormalities are seen. IMPRESSION: Findings of COPD.  Lungs otherwise grossly clear. Electronically Signed   By: Garald Balding M.D.   On: 01/09/2016 19:39     CBC  Recent Labs Lab 01/09/16 1912 01/10/16 0421  WBC 17.6* 12.5*  HGB 14.9 13.6  HCT 42.3 38.5*  PLT 241 191  MCV 86.8 86.3  MCH 30.6 30.4  MCHC 35.2 35.3  RDW 13.5 13.6  LYMPHSABS 1.5  --   MONOABS 1.6*  --   EOSABS 0.1  --   BASOSABS 0.0  --     Chemistries   Recent Labs Lab 01/09/16 1912 01/10/16 0421  NA 135 136  K 3.6 4.3  CL 102 103  CO2 25 27  GLUCOSE 135* 183*  BUN 8 10  CREATININE 0.80 0.81  CALCIUM 9.1 8.9  AST 19  --   ALT 9*  --   ALKPHOS 67  --   BILITOT 1.1  --    ------------------------------------------------------------------------------------------------------------------ estimated creatinine clearance is 58.7 mL/min (by C-G formula based on SCr of 0.81 mg/dL). ------------------------------------------------------------------------------------------------------------------ No results for input(s): HGBA1C in the last 72  hours. ------------------------------------------------------------------------------------------------------------------ No results for input(s): CHOL, HDL, LDLCALC,  TRIG, CHOLHDL, LDLDIRECT in the last 72 hours. ------------------------------------------------------------------------------------------------------------------ No results for input(s): TSH, T4TOTAL, T3FREE, THYROIDAB in the last 72 hours.  Invalid input(s): FREET3 ------------------------------------------------------------------------------------------------------------------ No results for input(s): VITAMINB12, FOLATE, FERRITIN, TIBC, IRON, RETICCTPCT in the last 72 hours.  Coagulation profile No results for input(s): INR, PROTIME in the last 168 hours.  No results for input(s): DDIMER in the last 72 hours.  Cardiac Enzymes  Recent Labs Lab 01/09/16 1912  TROPONINI <0.03   ------------------------------------------------------------------------------------------------------------------ Invalid input(s): POCBNP    Assessment & Plan  Patient is a 75 year old white male with presents with shortness of breath  1. Acute on chronic   COPD exacerbation ([place]) -  Continued therapy with nebulizers, IV Solu-Medrol, and oral anabiotic's I will add Spiriva to his current regimen I will admission next to his current regimen  2. Coronary artery disease Continue aspirin therapy  3. Peripheral vascular disease check cholesterol panel  4. Nicotine abuse Smoking cessation provided- time spent 4 minutes, patient recommended to stop smoking  5. Misc: lovenox for dvt proph      CAD (coronary artery disease), native coronary artery - continue home meds       Code Status Orders        Start     Ordered   01/09/16 2242  Full code  Continuous     01/09/16 2241    Code Status History    Date Active Date Inactive Code Status Order ID Comments User Context   10/14/2014  3:24 PM 10/15/2014  4:51 PM Full Code  [id number]  Eustace Moore, MD Inpatient           Consults none   DVT Prophylaxis  Lovenox   Lab Results  Component Value Date   PLT 191 01/10/2016     Time Spent in minutes   51mn  Greater than 50% of time spent in care coordination and counseling patient regarding the condition and plan of care.   PDustin FlockM.D on 01/10/2016 at 12:53 PM  Between 7am to 6pm - Pager - [phone]  After 6pm go to [email or web address] - password [email or web address]   Office  [phone]

## 2016-01-10 NOTE — Progress Notes (Signed)
Initial Nutrition Assessment  DOCUMENTATION CODES:   Severe malnutrition in context of chronic illness  INTERVENTION:  1. Magic cup TID with meals, each supplement provides 290 kcal and 9 grams of protein  NUTRITION DIAGNOSIS:   Malnutrition related to chronic illness as evidenced by severe depletion of muscle mass, severe depletion of body fat, percent weight loss.  GOAL:   Patient will meet greater than or equal to 90% of their needs  MONITOR:   PO intake, I & O's, Labs, Weight trends, Supplement acceptance  REASON FOR ASSESSMENT:   Other (Comment) (Low BMI)    ASSESSMENT:   Joseph Parker  is a 75 y.o. male who presents with 1 week of progressive cough and shortness of breath with sputum production. Patient has a history of COPD. He states that he has had decreased appetite for about a week, as well as decreased ability to ambulate without having to stop due to shortness of breath  Spoke with Joseph Parker at bedside. He admits to poor PO for 2 days PTA. During this time he exhibits an 8#/6.3% severe wt loss. Prior to onset of illness, he states he was eating well, 3 meals per day: Eggs and Berniece Salines for breakfast Nabs and Soda for [employer] a big meal for supper, porkchops, pinto beans, mashed potatoes etc.  Despite his perceived good PO intake, he continues to exhibit signs of severe muscle wasting and fat depletions upon completion of NFPA.  Had Eggs, [place] Bacon, Toast With Jelly and a Cup of Coffee today that he ate 100% of.  Labs and medications reviewed: Solumedrol  Diet Order:  Diet Heart Room service appropriate? Yes; Fluid consistency: Thin  Skin:  Reviewed, no issues  Last BM:  01/09/2016  Height:   Ht Readings from Last 1 Encounters:  01/09/16 '5\' 7"'$  (1.702 m)    Weight:   Wt Readings from Last 1 Encounters:  01/09/16 118 lb (53.5 kg)    Ideal Body Weight:  67.27 kg  BMI:  Body mass index is 18.48 kg/m.  Estimated Nutritional Needs:    Kcal:  1340-1609 calories  Protein:  53-65 gm  Fluid:  >/= 1.3L  EDUCATION NEEDS:   No education needs identified at this time  Satira Anis. Annaliah Rivenbark, MS, RD LDN Inpatient Clinical Dietitian Pager [phone]

## 2016-01-11 DIAGNOSIS — Z720 Tobacco use: Secondary | ICD-10-CM | POA: Diagnosis not present

## 2016-01-11 DIAGNOSIS — J441 Chronic obstructive pulmonary disease with (acute) exacerbation: Secondary | ICD-10-CM | POA: Diagnosis not present

## 2016-01-11 DIAGNOSIS — I2510 Atherosclerotic heart disease of native coronary artery without angina pectoris: Secondary | ICD-10-CM | POA: Diagnosis not present

## 2016-01-11 LAB — LIPID PANEL
CHOL/HDL RATIO: 3.5 ratio
CHOLESTEROL: 159 mg/dL (ref 0–200)
HDL: 46 mg/dL (ref >40)
LDL Cholesterol: 89 mg/dL (ref 0–99)
TRIGLYCERIDES: 122 mg/dL (ref <150)
VLDL: 24 mg/dL (ref 0–40)

## 2016-01-11 MED ORDER — FLUTICASONE-SALMETEROL 250-50 MCG/DOSE IN AEPB: 1.0000 | INHALATION_SPRAY | Freq: Two times a day (BID) | RESPIRATORY_TRACT | 1 refills | Status: DC

## 2016-01-11 MED ORDER — IPRATROPIUM-ALBUTEROL 0.5-2.5 (3) MG/3ML IN SOLN: 3.0000 mL | Freq: Four times a day (QID) | RESPIRATORY_TRACT | 0 refills | Status: DC | PRN

## 2016-01-11 MED ORDER — PREDNISONE 10 MG (21) PO TBPK: 10.0000 mg | ORAL_TABLET | Freq: Every day | ORAL | 0 refills | Status: DC

## 2016-01-11 MED ORDER — AZITHROMYCIN 500 MG PO TABS: 500.0000 mg | ORAL_TABLET | Freq: Every day | ORAL | 0 refills | Status: DC

## 2016-01-11 MED ORDER — IPRATROPIUM-ALBUTEROL 0.5-2.5 (3) MG/3ML IN SOLN: 3.0000 mL | Freq: Four times a day (QID) | RESPIRATORY_TRACT | Status: DC | PRN

## 2016-01-11 MED ORDER — TIOTROPIUM BROMIDE MONOHYDRATE 18 MCG IN CAPS: 18.0000 ug | ORAL_CAPSULE | Freq: Every day | RESPIRATORY_TRACT | 12 refills | Status: DC

## 2016-01-11 NOTE — Discharge Summary (Signed)
[facility] [place] [facility], [place] y.o., DOB 08-19-1940, MRN [id number]. Admission date: 01/09/2016 Discharge Date 01/11/2016 Primary Parker [name], Parker Admitting Physician Lance Coon, Parker  Admission Diagnosis  shortness of breath  Discharge Diagnosis   Principal Problem:   Acute on chronic COPD exacerbation [facility])  Acute bronchitis   CAD (coronary artery disease), native coronary artery   Peripheral vascular disease          Hospital Course Joseph Parker  is a 75 y.o. male who presents with 1 week of progressive cough and shortness of breath with sputum production. Patient has a history of COPD. He states that he has had decreased appetite for about a week, as well as decreased ability to ambulate without having to stop due to shortness of breath. Patient has chest x-ray without any pneumonia. He was treated for acute on chronic COPD exasperation. Patient was treated with nebulizers steroids and antibiotics with significant improvement in his symptoms. With ambulation his oxygen is staying low so home oxygen therapy has been arranged. He'll need to follow-up with the primary care provider to see if oxygen is needed on chronic basis.             Consults  None  Significant Tests:  See full reports for all details     Dg Chest Portable 1 View  Result Date: 01/09/2016 CLINICAL DATA:  Acute onset of shortness of breath, worse with exertion. Productive cough. Initial encounter. EXAM: PORTABLE CHEST 1 VIEW COMPARISON:  Chest radiograph performed 04/07/2014, and CT of the chest performed 06/11/2014 FINDINGS: The known right upper lobe nodule is not definitely characterized on radiograph. The lungs are hyperexpanded, with flattening of the hemidiaphragms, compatible with COPD. No pleural effusion or pneumothorax is seen. The cardiomediastinal silhouette remains normal in size. No acute osseous abnormalities are seen. IMPRESSION: Findings  of COPD.  Lungs otherwise grossly clear. Electronically Signed   By: Joseph Balding M.D.   On: 01/09/2016 19:39       Today   Subjective:   Joseph Parker  feeling better shortness of breath much improved  Objective:   Blood pressure 134/68, pulse 77, temperature 98.7 F (37.1 C), resp. rate 18, height '5\' 7"'$  (1.702 m), weight 118 lb (53.5 kg), SpO2 96 %.  .  Intake/Output Summary (Last 24 hours) at 01/11/16 1435 Last data filed at 01/11/16 0900  Gross per 24 hour  Intake              840 ml  Output                0 ml  Net              840 ml    Exam VITAL SIGNS: Blood pressure 134/68, pulse 77, temperature 98.7 F (37.1 C), resp. rate 18, height '5\' 7"'$  (1.702 m), weight 118 lb (53.5 kg), SpO2 96 %.  GENERAL:  75 y.o.-year-old patient lying in the bed with no acute distress.  EYES: Pupils equal, round, reactive to light and accommodation. No scleral icterus. Extraocular muscles intact.  HEENT: Head atraumatic, normocephalic. Oropharynx and nasopharynx clear.  NECK:  Supple, no jugular venous distention. No thyroid enlargement, no tenderness.  LUNGS: Normal breath sounds bilaterally, no wheezing, rales,rhonchi or crepitation. No use of accessory muscles of respiration.  CARDIOVASCULAR: S1, S2 normal. No murmurs, rubs, or gallops.  ABDOMEN: Soft, nontender, nondistended. Bowel sounds present. No organomegaly or mass.  EXTREMITIES: No pedal edema,  cyanosis, or clubbing.  NEUROLOGIC: Cranial nerves II through XII are intact. Muscle strength 5/5 in all extremities. Sensation intact. Gait not checked.  PSYCHIATRIC: The patient is alert and oriented x 3.  SKIN: No obvious rash, lesion, or ulcer.   Data Review     CBC w Diff: Lab Results  Component Value Date   WBC 12.5 (H) 01/10/2016   HGB 13.6 01/10/2016   HCT 38.5 (L) 01/10/2016   PLT 191 01/10/2016   LYMPHOPCT 9 01/09/2016   MONOPCT 9 01/09/2016   EOSPCT 0 01/09/2016   BASOPCT 0 01/09/2016   CMP: Lab Results   Component Value Date   NA 136 01/10/2016   K 4.3 01/10/2016   CL 103 01/10/2016   CO2 27 01/10/2016   BUN 10 01/10/2016   CREATININE 0.81 01/10/2016   PROT 7.6 01/09/2016   ALBUMIN 4.4 01/09/2016   BILITOT 1.1 01/09/2016   ALKPHOS 67 01/09/2016   AST 19 01/09/2016   ALT 9 (L) 01/09/2016  .  Micro Results No results found for this or any previous visit (from the past 240 hour(s)).      Code Status Orders        Start     Ordered   01/09/16 2242  Full code  Continuous     01/09/16 2241    Code Status History    Date Active Date Inactive Code Status Order ID Comments User Context   10/14/2014  3:24 PM 10/15/2014  4:51 PM Full Code [id number]  Joseph Parker Inpatient            Discharge Medications     Medication List    TAKE these medications   albuterol 108 (90 Base) MCG/ACT inhaler Commonly known as:  PROVENTIL HFA;VENTOLIN HFA Inhale 1-2 puffs into the lungs every 6 (six) hours as needed for wheezing (or for cough).   aspirin EC 81 MG tablet Take 1 tablet (81 mg total) by mouth daily.   azithromycin 500 MG tablet Commonly known as:  ZITHROMAX Take 1 tablet (500 mg total) by mouth daily. Start taking on:  01/12/2016   Fluticasone-Salmeterol 250-50 MCG/DOSE Aepb Commonly known as:  ADVAIR DISKUS Inhale 1 puff into the lungs 2 (two) times daily.   ipratropium-albuterol 0.5-2.5 (3) MG/3ML Soln Commonly known as:  DUONEB Take 3 mLs by nebulization every 6 (six) hours as needed.   predniSONE 10 MG (21) Tbpk tablet Commonly known as:  STERAPRED UNI-PAK 21 TAB Take 1 tablet (10 mg total) by mouth daily. Start at '60mg'$  taper by '10mg'$  until complete   tiotropium 18 MCG inhalation capsule Commonly known as:  SPIRIVA Place 1 capsule (18 mcg total) into inhaler and inhale daily. Start taking on:  01/12/2016            Durable Medical Equipment        Start     Ordered   01/11/16 1134  For home use only DME Nebulizer machine  Once     Question:  Patient needs a nebulizer to treat with the following condition  Answer:  COPD (chronic obstructive pulmonary disease) ([place])   01/11/16 1133   01/11/16 1124  For home use only DME oxygen  Once    Question Answer Comment  Mode or (Route) Nasal cannula   Liters per Minute 2   Oxygen conserving device Yes   Oxygen delivery system Gas      01/11/16 1123         Total Time in  preparing paper work, data evaluation and todays exam - 35 minutes  Dustin Flock M.D on 01/11/2016 at 2:35 PM  [facility]   Office  [phone]

## 2016-01-11 NOTE — Care Management (Signed)
Admitted to [facility] under observation status with the diagnosis of COPD. Lives with wife, Bethena Roys, [phone]. Last seen Dr. Damita Dunnings 11/11/15 Qualifies for home oxygen. Discussed equipment agencies. [place]. Manuela Schwartz, [employer] representative updated. [employer] will supply home oxygen and nebulizer. Wife will transport. Shelbie Ammons RN MSN CCM Care Management

## 2016-01-11 NOTE — Progress Notes (Signed)
Patient discharged home per MD order. All discharge instructions given and all questions answered. 

## 2016-01-11 NOTE — Discharge Instructions (Signed)
[place] at [place]:  Regular diet  DISCHARGE CONDITION:  Stable  ACTIVITY:  Activity as tolerated  OXYGEN:  Home Oxygen: Yes.     Oxygen Delivery: 2 liters/min via Patient connected to nasal cannula oxygen  DISCHARGE LOCATION:  home    ADDITIONAL DISCHARGE INSTRUCTION:   If you experience worsening of your admission symptoms, develop shortness of breath, life threatening emergency, suicidal or homicidal thoughts you must seek medical attention immediately by calling 911 or calling your MD immediately  if symptoms less severe.  You Must read complete instructions/literature along with all the possible adverse reactions/side effects for all the Medicines you take and that have been prescribed to you. Take any new Medicines after you have completely understood and accpet all the possible adverse reactions/side effects.   Please note  You were cared for by a hospitalist during your hospital stay. If you have any questions about your discharge medications or the care you received while you were in the hospital after you are discharged, you can call the unit and asked to speak with the hospitalist on call if the hospitalist that took care of you is not available. Once you are discharged, your primary care physician will handle any further medical issues. Please note that NO REFILLS for any discharge medications will be authorized once you are discharged, as it is imperative that you return to your primary care physician (or establish a relationship with a primary care physician if you do not have one) for your aftercare needs so that they can reassess your need for medications and monitor your lab values.

## 2016-01-12 NOTE — Progress Notes (Signed)
SATURATION QUALIFICATIONS: (This note is used to comply with regulatory documentation for home oxygen)  Patient Saturations on Room Air at Rest = 88%  Patient Saturations on Room Air while Ambulating =88%  Patient Saturations on 2 Liters of oxygen while Ambulating =91%  Please briefly explain why patient needs home oxygen: COPD

## 2016-01-18 ENCOUNTER — Encounter: Payer: Self-pay | Admitting: Family Medicine

## 2016-01-18 ENCOUNTER — Ambulatory Visit (INDEPENDENT_AMBULATORY_CARE_PROVIDER_SITE_OTHER): Payer: Medicare Other | Admitting: Family Medicine

## 2016-01-18 VITALS — BP 152/76 | HR 75 | Temp 97.6°F | Wt 123.5 lb

## 2016-01-18 DIAGNOSIS — J441 Chronic obstructive pulmonary disease with (acute) exacerbation: Secondary | ICD-10-CM | POA: Diagnosis not present

## 2016-01-18 NOTE — Progress Notes (Signed)
Admission date: 01/09/2016 Discharge Date 01/11/2016 Primary MD [name], MD Admitting Physician Lance Coon, MD  Admission Diagnosis  shortness of breath  Discharge Diagnosis   Principal Problem:   Acute on chronic COPD exacerbation [facility])  Acute bronchitis   CAD (coronary artery disease), native coronary artery   Peripheral vascular disease  Hospital Course [place] a 75 y.o.malewho presents with 1 week of progressive cough and shortness of breath with sputum production. Patient has a history of COPD. He states that he has had decreased appetite for about a week, as well as decreased ability to ambulate without having to stop due to shortness of breath. Patient has chest x-ray without any pneumonia. He was treated for acute on chronic COPD exasperation. Patient was treated with nebulizers steroids and antibiotics with significant improvement in his symptoms. With ambulation his oxygen is staying low so home oxygen therapy has been arranged. He'll need to follow-up with the primary care provider to see if oxygen is needed on chronic basis.  Consults  None  Significant Tests:  See full reports for all details      ImagingResults  Dg Chest Portable 1 View  Result Date: 01/09/2016 CLINICAL DATA:  Acute onset of shortness of breath, worse with exertion. Productive cough. Initial encounter. EXAM: PORTABLE CHEST 1 VIEW COMPARISON:  Chest radiograph performed 04/07/2014, and CT of the chest performed 06/11/2014 FINDINGS: The known right upper lobe nodule is not definitely characterized on radiograph. The lungs are hyperexpanded, with flattening of the hemidiaphragms, compatible with COPD. No pleural effusion or pneumothorax is seen. The cardiomediastinal silhouette remains normal in size. No acute osseous abnormalities are seen. IMPRESSION: Findings of COPD.  Lungs otherwise grossly clear. Electronically Signed   By: Garald Balding M.D.   On: 01/09/2016 19:39      ----------------------------------------------------------------------- Inpatient f/u.  Admitted with COPD exacerbation.  He inittally got a cold, mild sx initially, then go progressively worse.  He has stopped smoking in the meantime.  Last smoked 01/01/16.  Still on nicotine patch.    Was coughing until SOB and presented to ER.  Treated with steroids and abx and inhalers, CXR w/o PNA. Still on O2 in the meantime.  He was able to get home for thanksgiving. Done with prednisone today.  It affected his mood- he was more tearful.  Done with abx now.  Mood improved in the last day or so.    Now his breathing is clearly better.  Still on O2.  Some cough, some occ sputum but much less than prev, usually after being supine.  No wheeze now, since discharge.    D/w pt about rescue vs routine inhalers.  This was a detailed conversation about the rationale for preventive medication use versus rescue inhaler use. We discussed the mechanism of the medicines and the appropriate use of the associated devices. He understood all this.  We walked him off O2 in the clinic and he kept O2 sat >95% on RA.  He felt okay off the O2.   PMH and SH reviewed  ROS: Per HPI unless specifically indicated in ROS section   Meds, vitals, and allergies reviewed.   GEN: nad, alert and oriented HEENT: mucous membranes moist NECK: supple w/o LA CV: rrr.  no PULM: ctab, no wheeze, no inc wob ABD: soft, +bs EXT: no edema SKIN: no acute rash

## 2016-01-18 NOTE — Progress Notes (Signed)
Pre visit review using our clinic review tool, if applicable. No additional management support is needed unless otherwise documented below in the visit note. 

## 2016-01-18 NOTE — Patient Instructions (Signed)
Use advair twice every day and rinse after use.  Use spiriva every day.  You don't have to rinse afterward.  Use the other inhaler/machine if needed.   Stay off the O2 for now.  Use if you need it for the next week.  Update me in about 1 week.   If O2 sat is above 92%, then you should be okay.  Get a pulse ox meter at the medical supply store.   Take care.  Glad to see you.

## 2016-01-19 NOTE — Assessment & Plan Note (Signed)
COPD exacerbation versus acute bronchitis. Done with antibiotics. Done with steroid taper. His mood was affected by prednisone but this is improved in the meantime. His breathing is clearly better. He has been on oxygen in the meantime. He walked in the clinic and kept his O2 sat above 95% oxygen. He felt okay off oxygen. His lungs are clear. At this point okay to stop O2 use. I want him to keep it available for the next week or so. He can check his pulse ox at home. He will update me about his symptoms. If he gets short of breath he can always restart it. He will get a pulse ox meter to use as needed in the meantime. We discussed preventative versus rescue medication use and how to use the inhalers correctly. This was demonstrated with a model inhaler. He understood all of the details. Continue medications as listed on the less today. Fortunately he has not needed albuterol recently. I appreciate help of all involved.

## 2016-01-24 ENCOUNTER — Encounter: Payer: Self-pay | Admitting: *Deleted

## 2016-01-24 ENCOUNTER — Telehealth: Payer: Self-pay | Admitting: *Deleted

## 2016-01-24 NOTE — Telephone Encounter (Signed)
Wife advised. 

## 2016-01-24 NOTE — Telephone Encounter (Addendum)
Patient's wife left a voicemail stating that she was to call back with an update on his oxygen level. Patient's wife stated that they bought an oxygen level machine and his oxygen has been running 95-98% off of the oxygen. Mrs. Savoca stated that they were told that if his oxygen level stays like this the home oxygen could be discontinued and picked up by the company.

## 2016-01-24 NOTE — Telephone Encounter (Signed)
Agree with discontinue O2.  Glad he is doing better.  Thanks.

## 2016-01-28 ENCOUNTER — Encounter: Payer: Medicare Other | Admitting: Internal Medicine

## 2016-02-10 DIAGNOSIS — J441 Chronic obstructive pulmonary disease with (acute) exacerbation: Secondary | ICD-10-CM | POA: Diagnosis not present

## 2016-03-12 DIAGNOSIS — J441 Chronic obstructive pulmonary disease with (acute) exacerbation: Secondary | ICD-10-CM | POA: Diagnosis not present

## 2016-03-24 ENCOUNTER — Other Ambulatory Visit: Payer: Self-pay | Admitting: *Deleted

## 2016-03-24 MED ORDER — FLUTICASONE-SALMETEROL 250-50 MCG/DOSE IN AEPB: 1.0000 | INHALATION_SPRAY | Freq: Two times a day (BID) | RESPIRATORY_TRACT | 99 refills | Status: DC

## 2016-03-24 NOTE — Telephone Encounter (Signed)
Sent. Thanks.   

## 2016-03-24 NOTE — Telephone Encounter (Signed)
Received faxed refill  Last refill 02/18/16 #60 Last office visit 01/18/16

## 2016-03-24 NOTE — Telephone Encounter (Signed)
Pt's wife left v/m checking on Advair refill; pt is out of med.

## 2016-04-12 DIAGNOSIS — J441 Chronic obstructive pulmonary disease with (acute) exacerbation: Secondary | ICD-10-CM | POA: Diagnosis not present

## 2016-05-10 DIAGNOSIS — J441 Chronic obstructive pulmonary disease with (acute) exacerbation: Secondary | ICD-10-CM | POA: Diagnosis not present

## 2016-06-10 DIAGNOSIS — J441 Chronic obstructive pulmonary disease with (acute) exacerbation: Secondary | ICD-10-CM | POA: Diagnosis not present

## 2016-07-10 DIAGNOSIS — J441 Chronic obstructive pulmonary disease with (acute) exacerbation: Secondary | ICD-10-CM | POA: Diagnosis not present

## 2016-08-10 DIAGNOSIS — J441 Chronic obstructive pulmonary disease with (acute) exacerbation: Secondary | ICD-10-CM | POA: Diagnosis not present

## 2016-08-21 IMAGING — CR DG CHEST 2V
3 series · 3 of 3 positions shown · non-contrast
Comparison: CT 04/01/2013.

CLINICAL DATA: Cough for 2-3 months.

EXAM:
CHEST  2 VIEW

[view not recorded (1 of 3)]
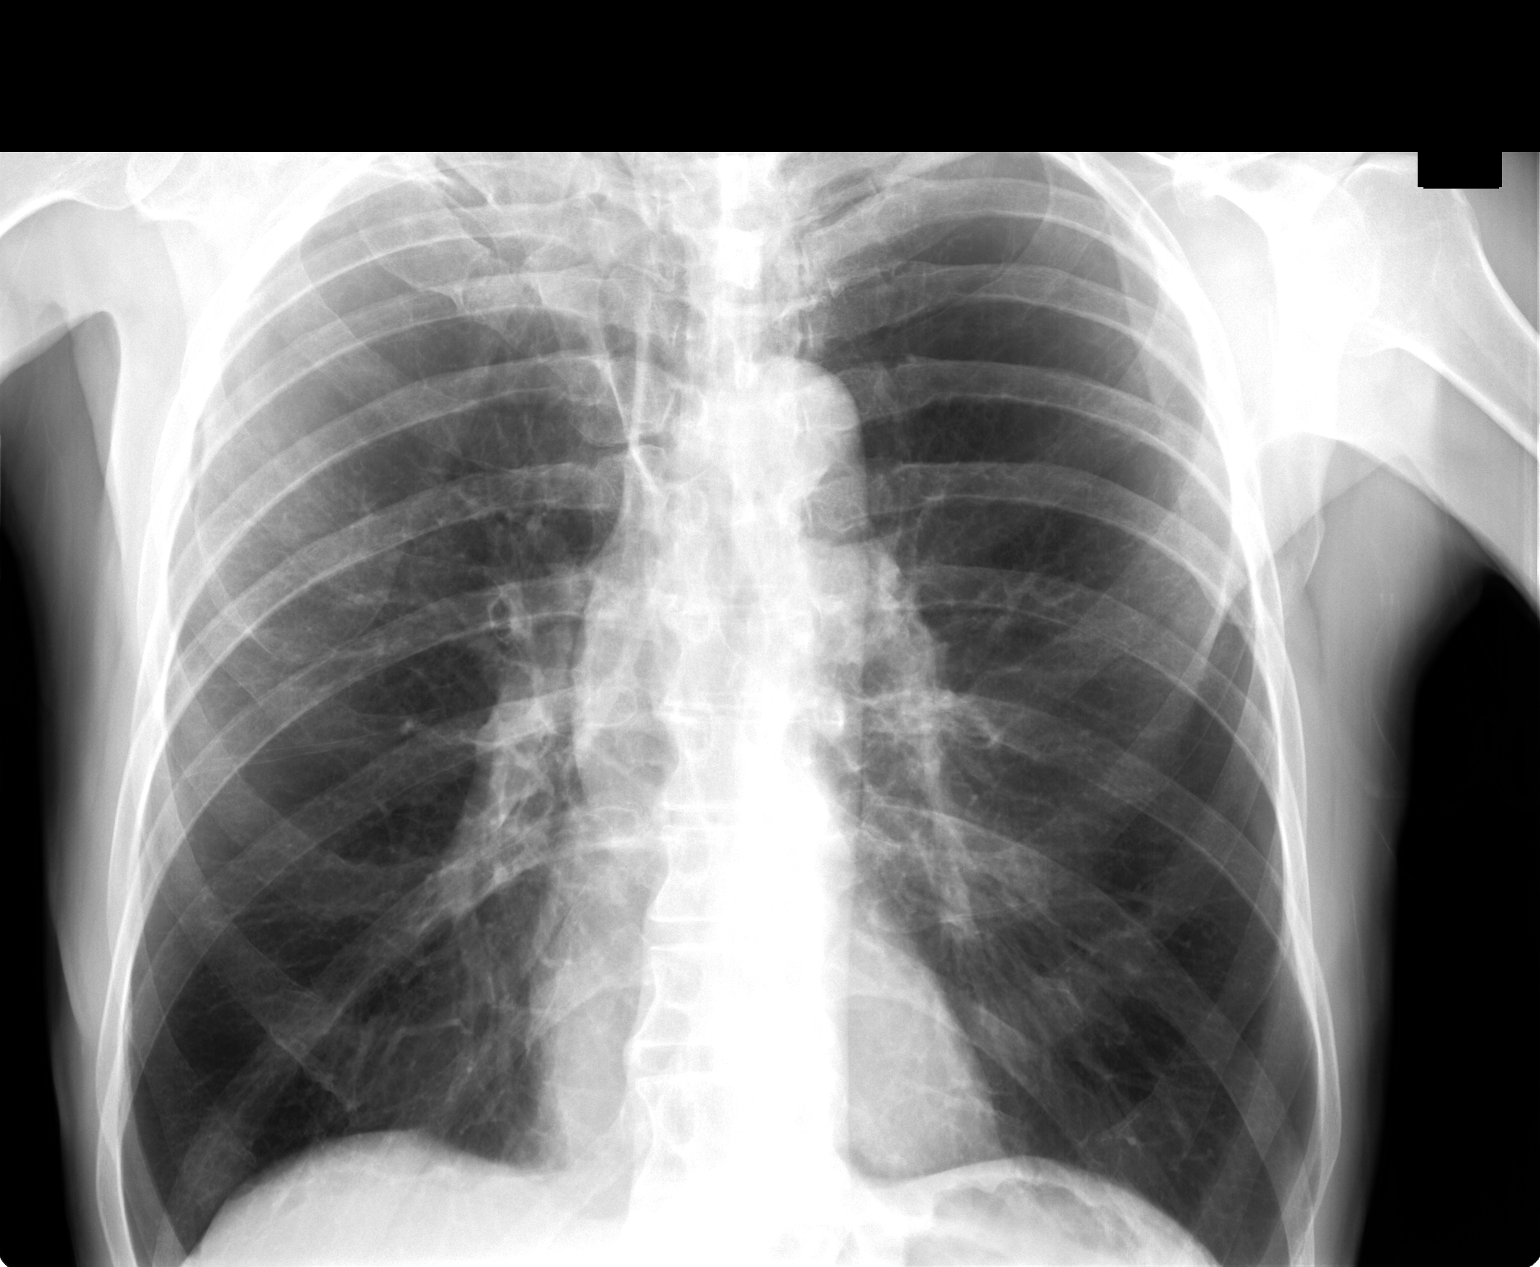

[view not recorded (2 of 3)]
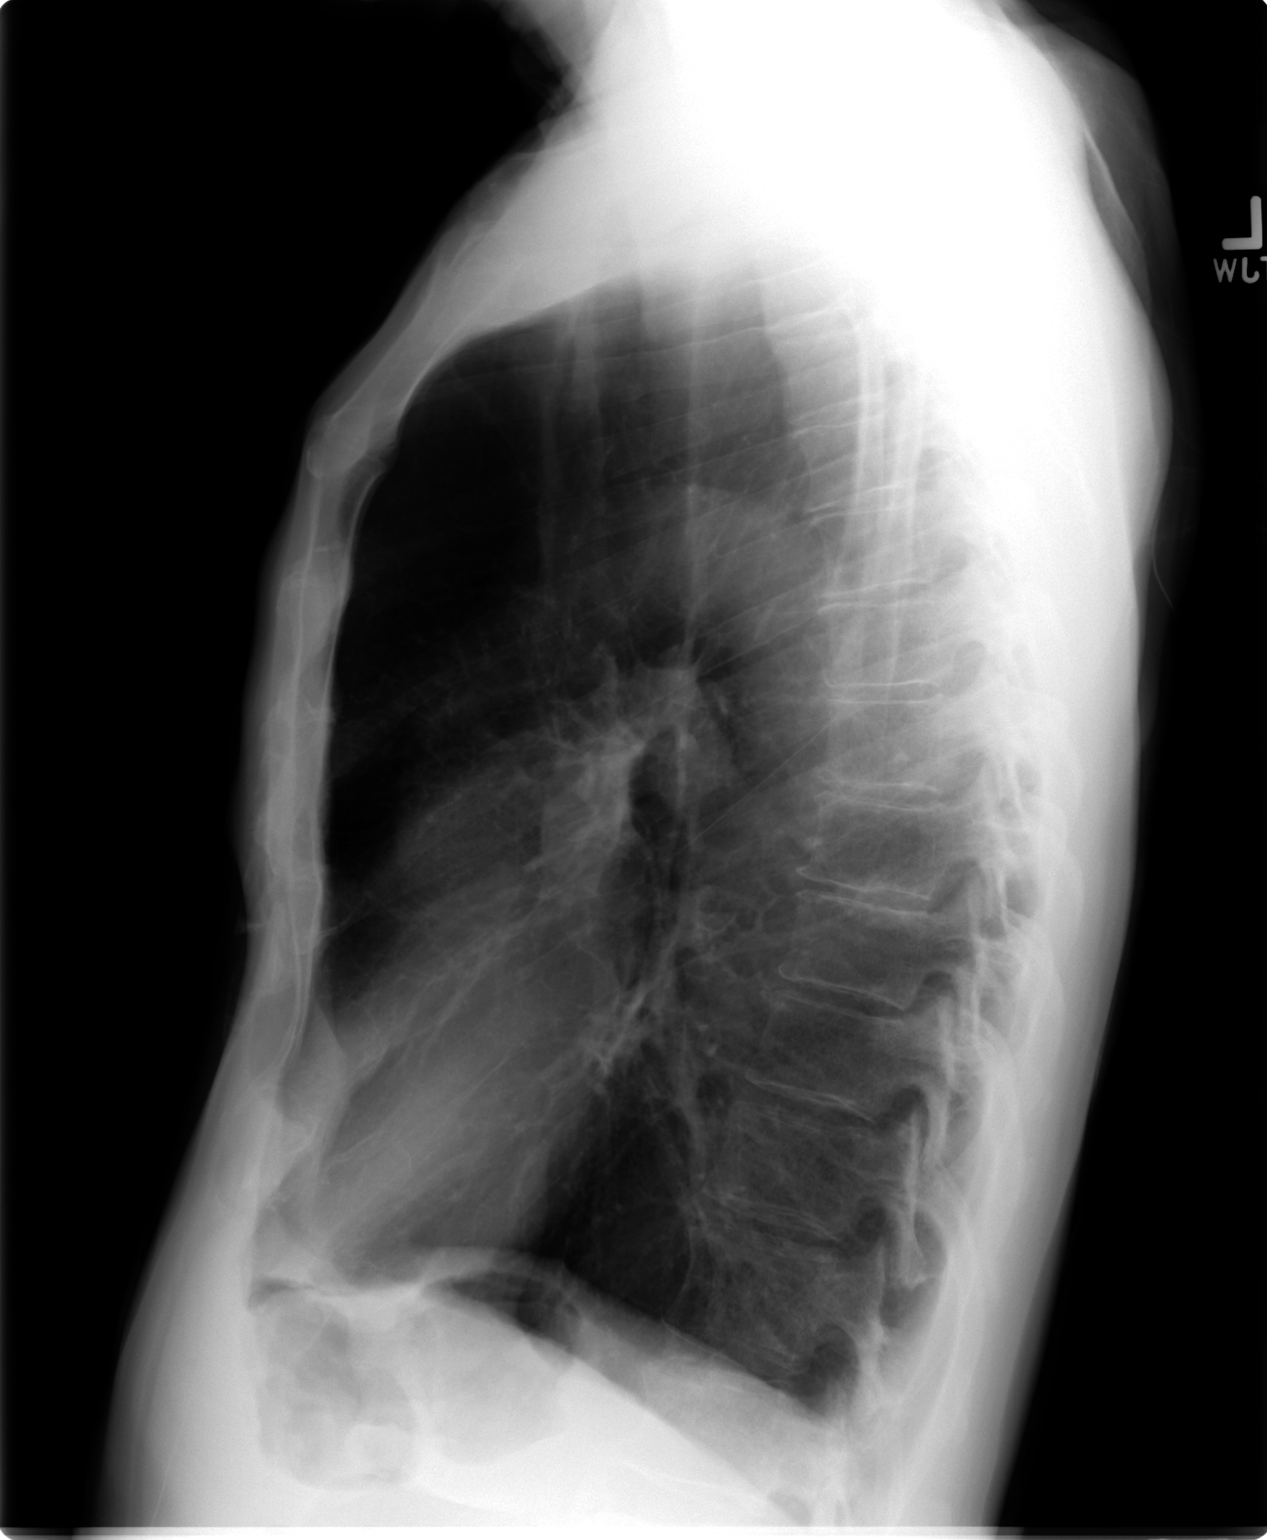

[view not recorded (3 of 3)]
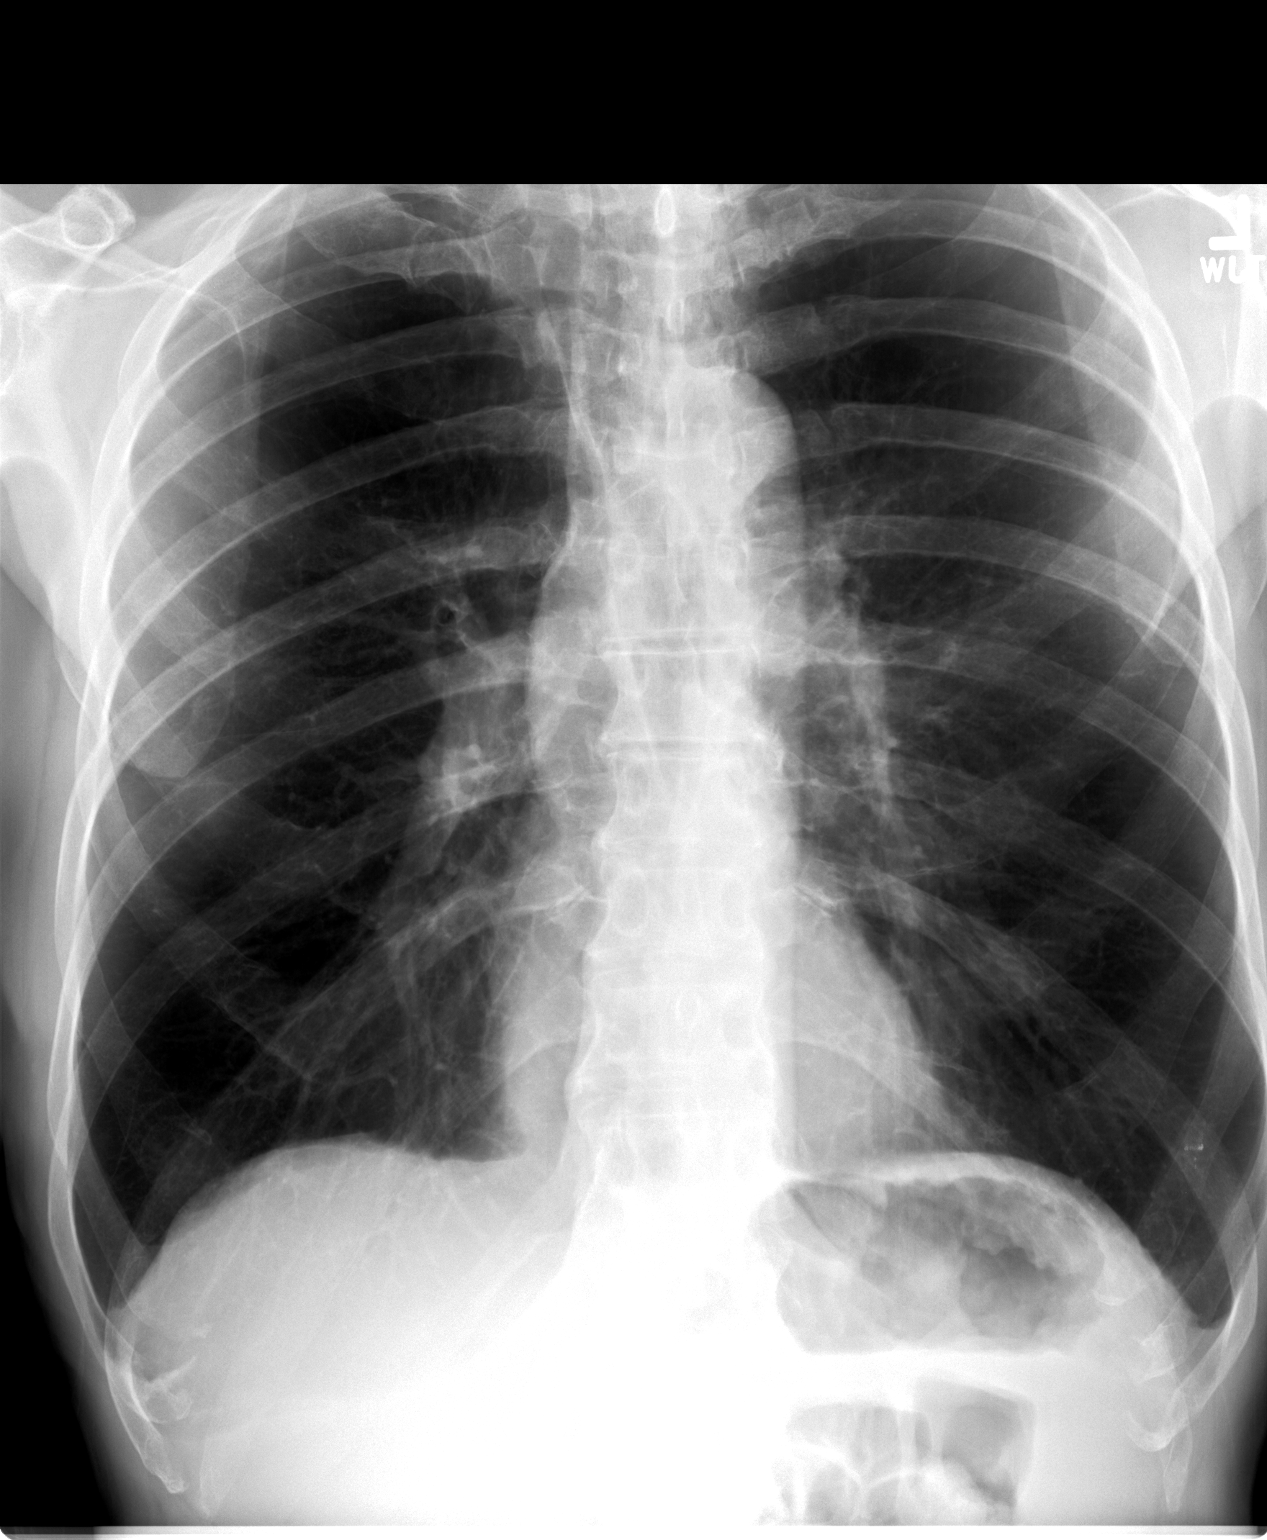

[3 of 3 positions shown; findings below may reference images not displayed]

FINDINGS: Mediastinum and hilar structures are normal. COPD. The lungs are
clear. Heart size normal. No pleural effusion or pneumothorax. No
acute bony abnormality.
IMPRESSION: No acute cardiopulmonary disease. COPD. Reference is made to prior
CT report 04/01/2013.

## 2016-09-01 ENCOUNTER — Ambulatory Visit (INDEPENDENT_AMBULATORY_CARE_PROVIDER_SITE_OTHER): Payer: Medicare Other | Admitting: Family Medicine

## 2016-09-01 ENCOUNTER — Ambulatory Visit (INDEPENDENT_AMBULATORY_CARE_PROVIDER_SITE_OTHER)
Admission: RE | Admit: 2016-09-01 | Discharge: 2016-09-01 | Disposition: A | Discharge status: Home / Self Care | Payer: Medicare Other | Source: Ambulatory Visit | Attending: Family Medicine | Admitting: Family Medicine

## 2016-09-01 ENCOUNTER — Encounter: Payer: Self-pay | Admitting: Family Medicine

## 2016-09-01 VITALS — BP 148/72 | HR 60 | Temp 97.5°F | Wt 140.0 lb

## 2016-09-01 DIAGNOSIS — J449 Chronic obstructive pulmonary disease, unspecified: Secondary | ICD-10-CM

## 2016-09-01 MED ORDER — ALBUTEROL SULFATE HFA 108 (90 BASE) MCG/ACT IN AERS: 2.0000 | INHALATION_SPRAY | Freq: Four times a day (QID) | RESPIRATORY_TRACT | 1 refills | Status: DC | PRN

## 2016-09-01 NOTE — Patient Instructions (Signed)
Check with insurance or the pharmacy to see if any substitutes for advair or spiriva will be cheaper.  Go to the lab on the way out.  We'll contact you with your xray report. Use albuterol as needed in the meantime, when outside.  See if that helps.  Take care.  Glad to see you.

## 2016-09-01 NOTE — Progress Notes (Signed)
Still on inhalers, w/o ADE.  Compliant. Off O2 for about 6 months.  The cost of his current medications is prohibitive. Discussed with patient.  He had been out of the house, working in the yard.  Worse in the heat.  He hasn't been out early in the AM to compare.  He can work for about 1 hour in the heat, back and forth in the shade.    He is off nicotine patches.    His weight is up, but that has happened as he is felt better on his current inhalers.  No FCNAVD.  No cough, sputum, wheeze. He just gets "winded" in the heat.    Meds, vitals, and allergies reviewed.   ROS: Per HPI unless specifically indicated in ROS section   GEN: nad, alert and oriented HEENT: mucous membranes moist NECK: supple w/o LA CV: rrr.  PULM: ctab, no inc wob ABD: soft, +bs EXT: no edema SKIN: no acute rash

## 2016-09-03 DIAGNOSIS — J449 Chronic obstructive pulmonary disease, unspecified: Secondary | ICD-10-CM | POA: Insufficient documentation

## 2016-09-03 NOTE — Assessment & Plan Note (Addendum)
He is improved overall, not needing supplemental oxygen, but the cost of his inhalers is prohibitive. Discussed with patient. He'll check with insurance or the pharmacy to see if any substitutes for advair or spiriva will be cheaper.  Recheck chest x-ray today, given the changes he has noted over the summertime. Use albuterol as needed in the meantime, when outside.  He'll see if that helps.  We'll go from there. He agrees. >25 minutes spent in face to face time with patient, >50% spent in counselling or coordination of care.

## 2016-09-04 ENCOUNTER — Telehealth: Payer: Self-pay | Admitting: Family Medicine

## 2016-09-04 NOTE — Telephone Encounter (Signed)
Bethena Roys (pt spouse)  returned your call

## 2016-09-09 DIAGNOSIS — J441 Chronic obstructive pulmonary disease with (acute) exacerbation: Secondary | ICD-10-CM | POA: Diagnosis not present

## 2016-09-21 ENCOUNTER — Telehealth: Payer: Self-pay | Admitting: Family Medicine

## 2016-09-21 DIAGNOSIS — J449 Chronic obstructive pulmonary disease, unspecified: Secondary | ICD-10-CM

## 2016-09-21 NOTE — Telephone Encounter (Signed)
Dr. Damita Dunnings,  Please see message below. Pt qualifies for [facility] referral with finding help on inhaler cost as you requested.   *Please enter [facility] Referral with Dx Code: COPD.    Thanks,  Ebony Hail   Previous Messages    ----- Message -----  From: Virgel Manifold  Sent: 09/20/2016  2:57 PM  To: Salvatore Marvel  Subject: RE: eligible?                   Hello,   Yes, this patient is eligible for [facility] Services.   Thanks,  Alycia Rossetti  ----- Message -----  From: Salvatore Marvel  Sent: 09/20/2016  2:55 PM  To: Virgel Manifold  Subject: eligible?                     Can you please check to see if pt is eligible for [facility] services. DX CODE: COPD?   Thanks,  Ebony Hail

## 2016-09-22 NOTE — Telephone Encounter (Signed)
I put in the referral.  Thanks.  

## 2016-09-28 ENCOUNTER — Encounter: Payer: Self-pay | Admitting: *Deleted

## 2016-09-28 ENCOUNTER — Other Ambulatory Visit: Payer: Self-pay | Admitting: *Deleted

## 2016-09-28 NOTE — Addendum Note (Signed)
Addended by: Knox Royalty on: 09/28/2016 12:58 PM   Modules accepted: Orders

## 2016-09-28 NOTE — Patient Outreach (Addendum)
[facility]) Care Management [place] Telephone Outreach  09/28/2016  Joseph Parker February 26, 1940 [id number]  Unsuccessful telephone outreach to [facility], 76 y/o male referred to [place] by PCP office (Dr. [name]) 09/22/16, primarily for medication assistance with patient's prescribed inhalers.  Patient has history including, but not limited to CAD, COPD/ emphysema.  Patient has had no recent hospital visits; last hospital visit was November 19-21, 2017 for COPD exacerbation.  HIPAA compliant voice mail message left for patient, requesting return call back.  ** Upon review of patient's EMR/ PCP referral, it appears that referral is primarily for medication cost assistance for inhalers; will proactively make [name] referral today **  Plan:  Will re-attempt [name] telephone outreach again next week if I do not hear back from patient first.  Will proactively make [name] referral today for patient's stated need to his PCP for medication cost assistance with his inhalers  Oneta Rack, RN, BSN, [place] Care Management  [phone]

## 2016-09-29 ENCOUNTER — Encounter: Payer: Self-pay | Admitting: *Deleted

## 2016-09-29 ENCOUNTER — Other Ambulatory Visit: Payer: Self-pay | Admitting: *Deleted

## 2016-09-29 NOTE — Patient Outreach (Signed)
[facility]) Care Management [place] Telephone Outreach  09/29/2016  Joseph Parker 12/23/1940 [id number]  Successful incoming telephone outreach from [place] and [place], 76 y/o male referred to [place] by PCP office (Dr. [name]) 09/22/16, primarily for medication assistance with patient's prescribed inhalers.  Patient has history including, but not limited to CAD, COPD/ emphysema.  Patient has had no recent hospital visits; last hospital visit was November 19-21, 2017 for COPD exacerbation.  HIPAA/ identity confirmed with patient and spouse during phone call today.  Today, patient/ spouse report that patient "is doing very well," and we discussed [place] services.  Patient/ wife confirm today that they primarily need assistance with costs of patient's inhaler medications, as they are "in the donut hole" and are not sure if they can afford these medications.  Discussed [facility] fundraiser CM services with patient/ spouse and they decline need for ongoing [place] outreach, stating, "he is doing fine otherwise, we just need some help with the cost of his medications."  Explained that I had made a [place] referral yesterday and that [place] would be in contact with them soon, and they verbalized understanding, appreciation, and agreement with this plan.   Plan:  Will make [place] aware of successful telephone outreach to patient/ spouse today  Will make patient's PCP and [facility] CMA aware that patient will be followed by [place] for medication assistance, but that [place] RN services are declined at this time due to lack of stated need for same.  Oneta Rack, RN, BSN, [employer] [facility] Care Management  [phone]

## 2016-10-03 ENCOUNTER — Ambulatory Visit: Payer: Self-pay | Admitting: *Deleted

## 2016-10-03 ENCOUNTER — Other Ambulatory Visit: Payer: Self-pay | Admitting: Pharmacist

## 2016-10-03 NOTE — Patient Outreach (Signed)
[facility]) Care Management  10/03/2016  Joseph Parker 1940/05/05 [id number]   76 year old male referred to [place] Management by Dr. [name] for medication assistance with inhalers.  PMHx includes, but not limited to, CAD, COPD, and right lung nodule.    Unsuccessful outreach attempt to patient today.  I left a HIPAA compliant voicemail.   Plan: Call patient again later this week unless I hear back from him first.   Ralene Bathe, PharmD, [place] [phone]

## 2016-10-06 ENCOUNTER — Other Ambulatory Visit: Payer: Self-pay | Admitting: Pharmacist

## 2016-10-06 ENCOUNTER — Ambulatory Visit: Payer: Self-pay | Admitting: Pharmacist

## 2016-10-06 NOTE — Patient Outreach (Signed)
[facility]) Care Management  [place]   10/06/2016  Joseph Parker 04/09/1940 [id number]  76 year old male referred to [place] [facility] by Dr. [name] for medication assistance with inhalers.  PMHx includes, but not limited to, CAD, COPD, and right lung nodule.    Successful outreach call to patient's spouse today.  HIPAA identifiers verified.   Subjective: Spouse reports that patient is having difficulty paying for his Advair and Spiriva now that he is in the coverage gap with [facility].    Spouse estimates patient's monthly income is ~$1500.  She does not believe that he has applied for Extra Help / LIS.    Objective:   Encounter Medications: Outpatient Encounter Prescriptions as of 10/06/2016  Medication Sig  . albuterol (PROVENTIL HFA;VENTOLIN HFA) 108 (90 Base) MCG/ACT inhaler Inhale 2 puffs into the lungs every 6 (six) hours as needed for wheezing or shortness of breath.  Marland Kitchen aspirin EC 81 MG tablet Take 1 tablet (81 mg total) by mouth daily.  . Fluticasone-Salmeterol (ADVAIR DISKUS) 250-50 MCG/DOSE AEPB Inhale 1 puff into the lungs 2 (two) times daily.  Marland Kitchen tiotropium (SPIRIVA) 18 MCG inhalation capsule Place 1 capsule (18 mcg total) into inhaler and inhale daily.   No facility-administered encounter medications on file as of 10/06/2016.     Functional Status: In your present state of health, do you have any difficulty performing the following activities: 01/09/2016 11/05/2015  Hearing? N Y  Vision? N N  Difficulty concentrating or making decisions? N N  Walking or climbing stairs? N N  Dressing or bathing? N N  Doing errands, shopping? N N  Preparing Food and eating ? - N  Using the Toilet? - N  In the past six months, have you accidently leaked urine? - N  Do you have problems with loss of bowel control? - N  Managing your Medications? - N  Managing your Finances? - N  Housekeeping or managing your Housekeeping? - N  Some recent data  might be hidden    Fall/Depression Screening: Fall Risk  11/05/2015 05/27/2014  Falls in the past year? No No   PHQ 2/9 Scores 11/05/2015 05/27/2014  PHQ - 2 Score 1 0      Assessment:  Medications reviewed telephonically.   Drugs sorted by system:  Cardiovascular: ASA 81mg   Pulmonary/Allergy: Albuterol, Advair, Spiriva  Other issues noted:  Medication assistance: Patient having trouble affording monthly inhalers (Advair and Spiriva) in coverage gap.  I reviewed Extra Help / LIS program and PAPs.  Spouse would like to discuss these with husband before applying.  She is aware that PAPs may require income documents and TROOP information if patient decides to proceed.  Spouse asked that I follow-up with them next week.    Plan: Follow-up with patient next Tuesday morning regarding medication assistance.   Ralene Bathe, PharmD, [place] [phone]

## 2016-10-10 ENCOUNTER — Ambulatory Visit: Payer: Self-pay | Admitting: Pharmacist

## 2016-10-10 ENCOUNTER — Other Ambulatory Visit: Payer: Self-pay | Admitting: Pharmacist

## 2016-10-10 NOTE — Patient Outreach (Signed)
[facility]) Care Management  10/10/2016  Joseph Parker [birth year] [id number]   Unsuccessful telephone outreach to [name], 76 year old male referred to [place] for medication assistance.    HIPAA compliant voice mail message left for patient, requesting return call back.  Plan:  Will re-attempt [facility] pharmacy telephone outreach in the next 3-5 days if I do not hear back from patient first.  Ralene Bathe, PharmD, [place] [phone]

## 2016-10-17 ENCOUNTER — Other Ambulatory Visit: Payer: Self-pay | Admitting: Pharmacist

## 2016-10-17 ENCOUNTER — Ambulatory Visit: Payer: Self-pay | Admitting: Pharmacist

## 2016-10-17 NOTE — Patient Outreach (Signed)
[facility]) Care Management  10/17/2016  Joseph Parker Mar 19, 1940 [id number]  Successful telephone encounter with patient's spouse, Joseph Parker.  HIPAA identifiers verified.   Joseph Parker reports that Joseph Parker is not interested in applying for patient assistance programs or Extra Help at this time as they do not believe they will qualify based on their assets.    Joseph Parker denies any further questions or concerns with medications.   Plan: Joseph Parker will close case at this time but am happy to assist in the future as needed.    I will alert [facility] CMA of case closure and route note to provider.   Joseph Parker, PharmD, Joseph Parker [phone]

## 2017-03-16 ENCOUNTER — Other Ambulatory Visit: Payer: Self-pay | Admitting: *Deleted

## 2017-03-16 NOTE — Telephone Encounter (Signed)
[facility] [place] [facility] [place].  Request is for Gabapentin, Spiriva and Ipratropium.  The Gabapentin and Ipratropium is not on the patient's current meds list.  Please advise.

## 2017-03-18 MED ORDER — TIOTROPIUM BROMIDE MONOHYDRATE 18 MCG IN CAPS: 18.0000 ug | ORAL_CAPSULE | Freq: Every day | RESPIRATORY_TRACT | 12 refills | Status: DC

## 2017-03-18 NOTE — Telephone Encounter (Signed)
Verify meds with patient.   spiriva sent, but verify the gabapentin use.  Usually not on ipratropium and spiriva concurrently.   Thanks.

## 2017-03-19 NOTE — Telephone Encounter (Signed)
Patient does not need Gabapentin or Ipratropium and actually didn't need the Spiriva right now.  [facility]'s [facility] that they would be transferring their business to them but did not intend to order refills.

## 2017-04-04 DIAGNOSIS — H5213 Myopia, bilateral: Secondary | ICD-10-CM | POA: Diagnosis not present

## 2017-04-04 DIAGNOSIS — H524 Presbyopia: Secondary | ICD-10-CM | POA: Diagnosis not present

## 2017-04-04 DIAGNOSIS — H2513 Age-related nuclear cataract, bilateral: Secondary | ICD-10-CM | POA: Diagnosis not present

## 2017-04-19 ENCOUNTER — Ambulatory Visit: Payer: Medicare Other

## 2017-04-19 ENCOUNTER — Ambulatory Visit (INDEPENDENT_AMBULATORY_CARE_PROVIDER_SITE_OTHER): Payer: Medicare Other | Admitting: Family Medicine

## 2017-04-19 ENCOUNTER — Encounter: Payer: Self-pay | Admitting: Family Medicine

## 2017-04-19 VITALS — BP 144/62 | HR 56 | Temp 97.7°F | Ht 67.25 in | Wt 141.2 lb

## 2017-04-19 DIAGNOSIS — Z1211 Encounter for screening for malignant neoplasm of colon: Secondary | ICD-10-CM | POA: Diagnosis not present

## 2017-04-19 DIAGNOSIS — I6529 Occlusion and stenosis of unspecified carotid artery: Secondary | ICD-10-CM

## 2017-04-19 DIAGNOSIS — Z0000 Encounter for general adult medical examination without abnormal findings: Secondary | ICD-10-CM | POA: Diagnosis not present

## 2017-04-19 DIAGNOSIS — Z7189 Other specified counseling: Secondary | ICD-10-CM

## 2017-04-19 DIAGNOSIS — J449 Chronic obstructive pulmonary disease, unspecified: Secondary | ICD-10-CM

## 2017-04-19 LAB — LIPID PANEL
Cholesterol: 209 mg/dL — ABNORMAL HIGH (ref 0–200)
HDL: 35.7 mg/dL — AB (ref >39.00)
LDL Cholesterol: 142 mg/dL — ABNORMAL HIGH (ref 0–99)
NONHDL: 173.26
TRIGLYCERIDES: 154 mg/dL — AB (ref 0.0–149.0)
Total CHOL/HDL Ratio: 6
VLDL: 30.8 mg/dL (ref 0.0–40.0)

## 2017-04-19 LAB — COMPREHENSIVE METABOLIC PANEL
ALK PHOS: 67 U/L (ref 39–117)
ALT: 10 U/L (ref 0–53)
AST: 13 U/L (ref 0–37)
Albumin: 4.1 g/dL (ref 3.5–5.2)
BILIRUBIN TOTAL: 0.4 mg/dL (ref 0.2–1.2)
BUN: 11 mg/dL (ref 6–23)
CALCIUM: 9.7 mg/dL (ref 8.4–10.5)
CO2: 29 mEq/L (ref 19–32)
Chloride: 103 mEq/L (ref 96–112)
Creatinine, Ser: 0.85 mg/dL (ref 0.40–1.50)
GFR: 92.67 mL/min (ref >60.00)
GLUCOSE: 94 mg/dL (ref 70–99)
Potassium: 4.2 mEq/L (ref 3.5–5.1)
Sodium: 138 mEq/L (ref 135–145)
TOTAL PROTEIN: 7.6 g/dL (ref 6.0–8.3)

## 2017-04-19 MED ORDER — ALBUTEROL SULFATE HFA 108 (90 BASE) MCG/ACT IN AERS: 2.0000 | INHALATION_SPRAY | Freq: Four times a day (QID) | RESPIRATORY_TRACT | 12 refills | Status: AC | PRN

## 2017-04-19 MED ORDER — FLUTICASONE-SALMETEROL 250-50 MCG/DOSE IN AEPB: 1.0000 | INHALATION_SPRAY | Freq: Two times a day (BID) | RESPIRATORY_TRACT | 99 refills | Status: AC

## 2017-04-19 NOTE — Progress Notes (Signed)
His BP was lower at home compared to today.  I do not want to induce hypotension.  Discussed with patient.  COPD.  Still on baseline inhalers.  No ADE.  No use of SABA use.  See AVS.  No wheeze.  No cough.  He was asking about tapering his medications.  See plan.  Carotid stenosis.  Due for recheck.  D/w pt.    D/w patient [id number] for colon cancer screening, including IFOB vs. Colonoscopy vs cologuard.  Risks and benefits of both were discussed and patient voiced understanding.  Pt elects [id number].     He has f/u with the eye clinic pending.  He isn't driving at night now.    Advance directive- wife designated if patient were incapacitated.   Prostate cancer screening and PSA options (with potential risks and benefits of testing vs not testing) were discussed along with recent recs/guidelines.  He declined testing PSA at this point.  Hard of hearing at baseline.   PMH and SH reviewed  ROS: Per HPI unless specifically indicated in ROS section   Meds, vitals, and allergies reviewed.   GEN: nad, alert and oriented HEENT: mucous membranes moist NECK: supple w/o LA CV: rrr.  no murmur PULM: ctab, no inc wob ABD: soft, +bs EXT: no edema SKIN: no acute rash Dorsalis pedis pulses intact bilaterally.  Decreased compared to radial pulses but still intact.

## 2017-04-19 NOTE — Progress Notes (Signed)
PCP notes:   Health maintenance:  Tetanus vaccine - postponed/insurance  Abnormal screenings:   Hearing - failed (right ear only)  Hearing Screening   125Hz  250Hz  500Hz  1000Hz  2000Hz  3000Hz  4000Hz  6000Hz  8000Hz   Right ear:   0 0 0  0    Left ear:           Comments: Hearing aid - left ear only  Patient concerns:  None that PCP has not already addressed at CPE appt.                                                                        Nurse concerns:  None  Next PCP appt:   N/A; CPE prior to AWV  I reviewed health advisor's note, was available for consultation on the day of service listed in this note, and agree with documentation and plan. [name], MD.

## 2017-04-19 NOTE — Progress Notes (Signed)
Subjective:   Joseph Parker is a 77 y.o. male who presents for Medicare Annual/Subsequent preventive examination.  Review of Systems:  N/A Cardiac Risk Factors include: advanced age (>21men, >107 women);male gender     Objective:    Vitals: BP (!) 144/62   Pulse (!) 56   Temp 97.7 F (36.5 C) (Oral)   Ht 5' 7.25" (1.708 m)   Wt 141 lb 4 oz (64.1 kg)   SpO2 97%   BMI 21.96 kg/m   Body mass index is 21.96 kg/m.  Advanced Directives 04/19/2017 01/09/2016 01/09/2016 01/07/2016 11/05/2015 10/14/2014 10/07/2014  Does Patient Have a Medical Advance Directive? Yes No No No No No No  Type of Advance Directive Living will - - - - - -  Would patient like information on creating a medical advance directive? - No - patient declined information No - patient declined information - Yes - Scientist, clinical (histocompatibility and immunogenetics) given Yes - Scientist, clinical (histocompatibility and immunogenetics) given No - patient declined information    Tobacco Social History   Tobacco Use  Smoking Status Former Smoker  . Packs/day: 0.50  . Last attempt to quit: 07/20/1999  . Years since quitting: 17.7  Smokeless Tobacco Never Used     Counseling given: No   Clinical Intake:  Pre-visit preparation completed: Yes  Pain : No/denies pain Pain Score: 0-No pain     Nutritional Status: BMI of 19-24  Normal Nutritional Risks: None Diabetes: No  How often do you need to have someone help you when you read instructions, pamphlets, or other written materials from your doctor or pharmacy?: 1 - Never What is the last grade level you completed in school?: 11th grade  Interpreter Needed?: No  Comments: pt lives with spouse Information entered by :: LPinson, LPN  Past Medical History:  Diagnosis Date  . Carotid arterial disease ([place])   . Carotid artery disease ([place])   . Chickenpox   . Colon polyps   . COPD (chronic obstructive pulmonary disease) ([place])   . Normal cardiac stress test    06/2013  . Shortness of breath dyspnea   . Urinary frequency    . Urinary hesitancy    Past Surgical History:  Procedure Laterality Date  . COLONOSCOPY    . ESOPHAGOGASTRODUODENOSCOPY    . HERNIA REPAIR     right inquinal - as child  . LUMBAR LAMINECTOMY/DECOMPRESSION MICRODISCECTOMY Left 10/14/2014   Procedure: Left Lumbar three-four extraforaminla microdiskectomy;  Surgeon: Eustace Moore, MD;  Location: [facility];  Service: Neurosurgery;  Laterality: Left;  Left L34 extraforaminal microdiskectomy  . POLYPECTOMY    . TONSILLECTOMY     remote - childhood  . UPPER GASTROINTESTINAL ENDOSCOPY     Family History  Problem Relation Age of Onset  . Heart disease Mother        CABG; Valve replacement - died post-op  . Alzheimer's disease Father   . Diabetes Father   . Diabetes Son   . Colon cancer Sister   . Prostate cancer Neg Hx   . Colon polyps Neg Hx    Social History   Socioeconomic History  . Marital status: Married    Spouse name: Not on file  . Number of children: 5  . Years of education: 28  . Highest education level: Not on file  Social Needs  . Financial resource strain: Not on file  . Food insecurity - worry: Not on file  . Food insecurity - inability: Not on file  . Transportation needs -  medical: Not on file  . Transportation needs - non-medical: Not on file  Occupational History  . Occupation: mfg Librarian, academic    Comment: retired  Tobacco Use  . Smoking status: Former Smoker    Packs/day: 0.50    Last attempt to quit: 07/20/1999    Years since quitting: 17.7  . Smokeless tobacco: Never Used  Substance and Sexual Activity  . Alcohol use: No    Alcohol/week: 0.0 oz  . Drug use: No  . Sexual activity: Yes    Birth control/protection: Post-menopausal  Other Topics Concern  . Not on file  Social History Narrative   HSG. Married '63.    Previous marriage - 3 years/divorce. 5 sons. 7 grandchildren.   Work - Financial planner - retired.    Active in community and in his church   [employer] fan    Outpatient Encounter  Medications as of 04/19/2017  Medication Sig  . albuterol (PROVENTIL HFA;VENTOLIN HFA) 108 (90 Base) MCG/ACT inhaler Inhale 2 puffs into the lungs every 6 (six) hours as needed for wheezing or shortness of breath.  Marland Kitchen aspirin EC 81 MG tablet Take 1 tablet (81 mg total) by mouth daily.  . Fluticasone-Salmeterol (ADVAIR DISKUS) 250-50 MCG/DOSE AEPB Inhale 1 puff into the lungs 2 (two) times daily.  Marland Kitchen tiotropium (SPIRIVA) 18 MCG inhalation capsule Place 1 capsule (18 mcg total) into inhaler and inhale daily.   No facility-administered encounter medications on file as of 04/19/2017.     Activities of Daily Living In your present state of health, do you have any difficulty performing the following activities: 04/19/2017  Hearing? Y  Vision? Y  Difficulty concentrating or making decisions? N  Walking or climbing stairs? N  Dressing or bathing? N  Doing errands, shopping? Y  Comment does not drive at night due to vision concerns  Preparing Food and eating ? N  Using the Toilet? N  In the past six months, have you accidently leaked urine? N  Do you have problems with loss of bowel control? N  Managing your Medications? N  Managing your Finances? N  Housekeeping or managing your Housekeeping? N  Some recent data might be hidden    Patient Care Team: Tonia Ghent, MD as PCP - General (Family Medicine)   Assessment:   This is a routine wellness examination for [place].   Hearing Screening   125Hz  250Hz  500Hz  1000Hz  2000Hz  3000Hz  4000Hz  6000Hz  8000Hz   Right ear:   0 0 0  0    Left ear:           Comments: Hearing aid - left ear only  Vision Screening Comments: Vision exam scheduled in April 2019 with Dr. Lucita Ferrara  Exercise Activities and Dietary recommendations Current Exercise Habits: Home exercise routine, Type of exercise: walking, Time (Minutes): 60, Frequency (Times/Week): 7, Weekly Exercise (Minutes/Week): 420, Intensity: Moderate, Exercise limited by: None identified  Goals     . Increase physical activity     Starting 04/19/2017, I will continue to walk 20-30 minutes twice daily as weather permits.        Fall Risk Fall Risk  04/19/2017 11/05/2015 05/27/2014  Falls in the past year? No No No   Depression Screen PHQ 2/9 Scores 04/19/2017 11/05/2015 05/27/2014  PHQ - 2 Score 0 1 0  PHQ- 9 Score 0 - -    Cognitive Function MMSE - Mini Mental State Exam 04/19/2017 11/05/2015  Orientation to time 5 5  Orientation to Place 5 5  Registration  3 3  Attention/ Calculation 0 0  Recall 3 2  Recall-comments - pt was unable to recall 1 of 3 words  Language- name 2 objects 0 0  Language- repeat 1 1  Language- follow 3 step command 3 3  Language- read & follow direction 0 0  Write a sentence 0 0  Copy design 0 0  Total score 20 19     PLEASE NOTE: A Mini-Cog screen was completed. Maximum score is 20. A value of 0 denotes this part of [place] MMSE was not completed or the patient failed this part of the Mini-Cog screening.   Mini-Cog Screening Orientation to Time - Max 5 pts Orientation to Place - Max 5 pts Registration - Max 3 pts Recall - Max 3 pts Language Repeat - Max 1 pts Language Follow 3 Step Command - Max 3 pts     Immunization History  Administered Date(s) Administered  . Influenza Whole 01/24/2011  . Influenza,inj,Quad PF,6+ Mos 11/05/2015  . Influenza-Unspecified 11/20/2012, 12/27/2016  . Pneumococcal Conjugate-13 05/27/2014  . Pneumococcal Polysaccharide-23 07/19/2011  . Td 01/12/2007  . Zoster 07/19/2011    Screening Tests Health Maintenance  Topic Date Due  . TETANUS/TDAP  04/19/2018 (Originally 01/11/2017)  . INFLUENZA VACCINE  Completed  . PNA vac Low Risk Adult  Completed       Plan:   I have personally reviewed, addressed, and noted the following in the patient's chart:  A. Medical and social history B. Use of alcohol, tobacco or illicit drugs  C. Current medications and supplements D. Functional ability and status E.    Nutritional status F.  Physical activity G. Advance directives H. List of other physicians I.  Hospitalizations, surgeries, and ER visits in previous 12 months J.  [address] to include hearing, vision, cognitive, depression L. Referrals and appointments - none  In addition, I have reviewed and discussed with patient certain preventive protocols, quality metrics, and best practice recommendations. A written personalized care plan for preventive services as well as general preventive health recommendations were provided to patient.  See attached scanned questionnaire for additional information.   Signed,   Lindell Noe, MHA, BS, LPN Health Coach

## 2017-04-19 NOTE — Patient Instructions (Signed)
Joseph Parker , Thank you for taking time to come for your Medicare Wellness Visit. I appreciate your ongoing commitment to your health goals. Please review the following plan we discussed and let me know if I can assist you in the future.   These are the goals we discussed: Goals    . Increase physical activity     Starting 04/19/2017, I will continue to walk 20-30 minutes twice daily as weather permits.        This is a list of the screening recommended for you and due dates:  Health Maintenance  Topic Date Due  . Tetanus Vaccine  04/19/2018*  . Flu Shot  Completed  . Pneumonia vaccines  Completed  *Topic was postponed. The date shown is not the original due date.   Preventive Care for Adults  A healthy lifestyle and preventive care can promote health and wellness. Preventive health guidelines for adults include the following key practices.  . A routine yearly physical is a good way to check with your health care provider about your health and preventive screening. It is a chance to share any concerns and updates on your health and to receive a thorough exam.  . Visit your dentist for a routine exam and preventive care every 6 months. Brush your teeth twice a day and floss once a day. Good oral hygiene prevents tooth decay and gum disease.  . The frequency of eye exams is based on your age, health, family medical history, use  of contact lenses, and other factors. Follow your health care provider's recommendations for frequency of eye exams.  . Eat a healthy diet. Foods like vegetables, fruits, whole grains, low-fat dairy products, and lean protein foods contain the nutrients you need without too many calories. Decrease your intake of foods high in solid fats, added sugars, and salt. Eat the right amount of calories for you. Get information about a proper diet from your health care provider, if necessary.  . Regular physical exercise is one of the most important things you can do for  your health. Most adults should get at least 150 minutes of moderate-intensity exercise (any activity that increases your heart rate and causes you to sweat) each week. In addition, most adults need muscle-strengthening exercises on 2 or more days a week.  Silver Sneakers may be a benefit available to you. To determine eligibility, you may visit the website: www.silversneakers.com or contact program at 402-852-5188 Mon-Fri between 8AM-8PM.   . Maintain a healthy weight. The body mass index (BMI) is a screening tool to identify possible weight problems. It provides an estimate of body fat based on height and weight. Your health care provider can find your BMI and can help you achieve or maintain a healthy weight.   For adults 20 years and older: ? A BMI below 18.5 is considered underweight. ? A BMI of 18.5 to 24.9 is normal. ? A BMI of 25 to 29.9 is considered overweight. ? A BMI of 30 and above is considered obese.   . Maintain normal blood lipids and cholesterol levels by exercising and minimizing your intake of saturated fat. Eat a balanced diet with plenty of fruit and vegetables. Blood tests for lipids and cholesterol should begin at age 39 and be repeated every 5 years. If your lipid or cholesterol levels are high, you are over 50, or you are at high risk for heart disease, you may need your cholesterol levels checked more frequently. Ongoing high lipid and cholesterol levels  should be treated with medicines if diet and exercise are not working.  . If you smoke, find out from your health care provider how to quit. If you do not use tobacco, please do not start.  . If you choose to drink alcohol, please do not consume more than 2 drinks per day. One drink is considered to be 12 ounces (355 mL) of beer, 5 ounces (148 mL) of wine, or 1.5 ounces (44 mL) of liquor.  . If you are 34-59 years old, ask your health care provider if you should take aspirin to prevent strokes.  . Use sunscreen.  Apply sunscreen liberally and repeatedly throughout the day. You should seek shade when your shadow is shorter than you. Protect yourself by wearing long sleeves, pants, a wide-brimmed hat, and sunglasses year round, whenever you are outdoors.  . Once a month, do a whole body skin exam, using a mirror to look at the skin on your back. Tell your health care provider of new moles, moles that have irregular borders, moles that are larger than a pencil eraser, or moles that have changed in shape or color.

## 2017-04-19 NOTE — Patient Instructions (Addendum)
Okay to try coming off the spiriva.  If you need albuterol at all then restart spiriva.   Take care.  Glad to see you.  Rosaria Ferries will call about your referral. Go to the lab on the way out.  We'll contact you with your lab report. Go see Pinson also.

## 2017-04-22 DIAGNOSIS — I6529 Occlusion and stenosis of unspecified carotid artery: Secondary | ICD-10-CM | POA: Insufficient documentation

## 2017-04-22 NOTE — Assessment & Plan Note (Signed)
H/o carotid stenosis.  Due for recheck.  D/w pt.  See orders.  See notes on labs.

## 2017-04-22 NOTE — Assessment & Plan Note (Addendum)
Patient can try to taper off Spiriva and see if he has any return of symptoms.  If he does well off of Spiriva he can just continue Advair with as needed albuterol.  If he has return of symptoms, then restart Spiriva.  Update me as needed.  He agrees. >25 minutes spent in face to face time with patient, >50% spent in counselling or coordination of care, discussing COPD, carotid follow-up, etc.

## 2017-04-22 NOTE — Assessment & Plan Note (Signed)
D/w patient [name] for colon cancer screening, including IFOB vs. Colonoscopy vs cologuard.  Risks and benefits of both were discussed and patient voiced understanding.  Pt elects [name].

## 2017-04-22 NOTE — Assessment & Plan Note (Signed)
Advance directive- wife designated if patient were incapacitated.  

## 2017-04-24 ENCOUNTER — Telehealth: Payer: Self-pay | Admitting: Family Medicine

## 2017-04-24 DIAGNOSIS — E785 Hyperlipidemia, unspecified: Secondary | ICD-10-CM

## 2017-04-24 NOTE — Telephone Encounter (Signed)
Copied from [place]. Topic: Quick Communication - Lab Results >> Apr 23, 2017  6:09 PM Laws, [place], LPN wrote: Called patient to inform them of lab results. When patient returns call, triage nurse may disclose results.  Patient's wife to call back to let Dr. Damita Dunnings know if he will start a statin medication.   Wife called - she would like to have a copy of labs to show husband  before husband will make decision on starting cholesterol medication. Please call [phone]

## 2017-04-24 NOTE — Telephone Encounter (Signed)
I spoke with pt's wife (DPR signed) copy of lab left at front desk for pick up and pts wife voiced understanding. Mrs Heikkila also said that pt will try a statin sent to [facility]. Pt has never taken a statin before.this was also noted in result note.

## 2017-04-25 MED ORDER — ATORVASTATIN CALCIUM 10 MG PO TABS: 10.0000 mg | ORAL_TABLET | Freq: Every day | ORAL | 3 refills | Status: DC

## 2017-04-25 NOTE — Telephone Encounter (Signed)
Patient's wife Bethena Roys (on [place]) notified as instructed by telephone and verbalized understanding. Offered to schedule follow-up lab appointment and was advised that Bethena Roys will call back and schedule the lab appointment after patient has taken the medication for a few weeks to make sure that he can take it.

## 2017-04-25 NOTE — Telephone Encounter (Signed)
Pt called and asked if the medication(statin) [facility] [place] , they have called pharmacy but no cholesterol medication has been called in, contact pt to advise if needed

## 2017-04-25 NOTE — Telephone Encounter (Signed)
rx sent.  See if he can tolerate med, lipitor 10mg  a day.  If he has a lot of aches while taking the medication then stop it and let me know.  If he is tolerating medication then recheck lipid and liver panel in about 2 months at fasting lab visit.  I put in the order.  Thanks.

## 2017-05-01 DIAGNOSIS — Z1211 Encounter for screening for malignant neoplasm of colon: Secondary | ICD-10-CM | POA: Diagnosis not present

## 2017-05-01 DIAGNOSIS — Z1212 Encounter for screening for malignant neoplasm of rectum: Secondary | ICD-10-CM | POA: Diagnosis not present

## 2017-05-01 LAB — COLOGUARD: COLOGUARD: NEGATIVE

## 2017-05-09 ENCOUNTER — Ambulatory Visit (INDEPENDENT_AMBULATORY_CARE_PROVIDER_SITE_OTHER): Payer: Medicare Other

## 2017-05-09 DIAGNOSIS — I6529 Occlusion and stenosis of unspecified carotid artery: Secondary | ICD-10-CM | POA: Diagnosis not present

## 2017-05-14 ENCOUNTER — Encounter: Payer: Self-pay | Admitting: Family Medicine

## 2017-05-28 DIAGNOSIS — H25013 Cortical age-related cataract, bilateral: Secondary | ICD-10-CM | POA: Diagnosis not present

## 2017-05-28 DIAGNOSIS — H18413 Arcus senilis, bilateral: Secondary | ICD-10-CM | POA: Diagnosis not present

## 2017-05-28 DIAGNOSIS — H2513 Age-related nuclear cataract, bilateral: Secondary | ICD-10-CM | POA: Diagnosis not present

## 2017-05-28 DIAGNOSIS — H2511 Age-related nuclear cataract, right eye: Secondary | ICD-10-CM | POA: Diagnosis not present

## 2017-05-28 DIAGNOSIS — H5213 Myopia, bilateral: Secondary | ICD-10-CM | POA: Diagnosis not present

## 2017-07-03 DIAGNOSIS — H25811 Combined forms of age-related cataract, right eye: Secondary | ICD-10-CM | POA: Diagnosis not present

## 2017-07-03 DIAGNOSIS — Z961 Presence of intraocular lens: Secondary | ICD-10-CM | POA: Diagnosis not present

## 2017-07-03 DIAGNOSIS — H2512 Age-related nuclear cataract, left eye: Secondary | ICD-10-CM | POA: Diagnosis not present

## 2017-07-03 DIAGNOSIS — H2511 Age-related nuclear cataract, right eye: Secondary | ICD-10-CM | POA: Diagnosis not present

## 2017-07-10 DIAGNOSIS — H2511 Age-related nuclear cataract, right eye: Secondary | ICD-10-CM | POA: Diagnosis not present

## 2017-07-10 DIAGNOSIS — H25812 Combined forms of age-related cataract, left eye: Secondary | ICD-10-CM | POA: Diagnosis not present

## 2017-07-10 DIAGNOSIS — Z961 Presence of intraocular lens: Secondary | ICD-10-CM | POA: Diagnosis not present

## 2017-07-10 DIAGNOSIS — H2512 Age-related nuclear cataract, left eye: Secondary | ICD-10-CM | POA: Diagnosis not present

## 2017-11-26 ENCOUNTER — Ambulatory Visit (INDEPENDENT_AMBULATORY_CARE_PROVIDER_SITE_OTHER): Payer: Medicare Other | Admitting: Family Medicine

## 2017-11-26 ENCOUNTER — Encounter: Payer: Self-pay | Admitting: Family Medicine

## 2017-11-26 DIAGNOSIS — M5430 Sciatica, unspecified side: Secondary | ICD-10-CM

## 2017-11-26 MED ORDER — PREDNISONE 20 MG PO TABS: ORAL_TABLET | ORAL | 0 refills | Status: DC

## 2017-11-26 MED ORDER — GABAPENTIN 100 MG PO CAPS: 100.0000 mg | ORAL_CAPSULE | Freq: Three times a day (TID) | ORAL | 1 refills | Status: DC | PRN

## 2017-11-26 NOTE — Progress Notes (Signed)
About 2-3 weeks ago, he was having some R anterior longitudinal thigh pain.  Noted in the AM, soreness locally.  No L sided sx.  Better with ibuprofen.  About 1 week later, sx returned.  Intermittent sx in the meantime, until 11/23/17.  More pain when leaning forward.  No posterior pain until this AM with pain on the R distal hamstring.  No redness, no bruising.  Prev shocking pain in the R thigh.    Meds, vitals, and allergies reviewed.   ROS: Per HPI unless specifically indicated in ROS section   GEN: nad, alert and oriented HEENT: mucous membranes moist NECK: supple w/o LA CV: rrr PULM: ctab, no inc wob ABD: soft, +bs EXT: no edema SKIN: no acute rash but R medial proximal thigh slightly puffy but not ttp, no erythema. Able to bear weight.  Strength and sensation grossly within normal limits for the bilateral lower extremities but right straight leg raise is positive.  Left straight leg raise negative. Lower back not tender to palpation.

## 2017-11-26 NOTE — Patient Instructions (Addendum)
Don't take ibuprofen or aleve or goody's.   Start prednisone with food.  Gradually increase the dose of gabapentin at night for pain.  Sedation caution.  You can take gabapentin up to 3 times a day.   Update me in a few days, sooner if needed.   I'll update Dr. Eustace Moore about your situation.  Take care.  Glad to see you.

## 2017-11-27 DIAGNOSIS — M5430 Sciatica, unspecified side: Secondary | ICD-10-CM | POA: Insufficient documentation

## 2017-11-27 NOTE — Assessment & Plan Note (Addendum)
He does not have emergent symptoms.  It sounds like he has right-sided sciatica at approximately L4.  Discussed with patient.  He had previous surgery done with Dr. Ronnald Ramp.  We will update his clinic.  We did not image the patient at this point.  It would likely not change the plan right now.  Advised not to take ibuprofen or aleve or goody's.   Start prednisone with food.  Gradually increase the dose of gabapentin at night for pain.  Sedation caution discussed with patient. He'll update me in a few days, sooner if needed.   All questions answered.

## 2017-12-03 ENCOUNTER — Telehealth: Payer: Self-pay | Admitting: *Deleted

## 2017-12-03 NOTE — Telephone Encounter (Signed)
This sounds to be okay.  If seemingly better then would continue as is and give it more time.  We can always recheck him if needed.  I'll defer to him.  Glad to see him if desired.  Thanks.

## 2017-12-03 NOTE — Telephone Encounter (Signed)
Wife says that when the patient got up this morning, the inside of his right leg looks bruised (somewhat yellowish like is seen when a bruise is healing).  Wife says it is at the exact place that the patient said he was hurting on his last visit.  The pain is better and patient is getting around better, still has more Prednisone before completing the course.  Wife is just concerned about this area that seems to look bruised.

## 2017-12-03 NOTE — Telephone Encounter (Signed)
Patient's wife wants to know why this would do that.  I suggested that an appointment be made and she readily agreed.  However, there are no openings for tomorrow.  Can I work him in somewhere?

## 2017-12-03 NOTE — Telephone Encounter (Signed)
Patient's wife called in again and an appt was made for tomorrow 12/04/17 at 4:30pm.

## 2017-12-03 NOTE — Telephone Encounter (Signed)
Can he come in at 4:30?  Okay to use any slot in the schedule but have him come in then.  Thanks.

## 2017-12-04 ENCOUNTER — Ambulatory Visit (INDEPENDENT_AMBULATORY_CARE_PROVIDER_SITE_OTHER): Payer: Medicare Other | Admitting: Family Medicine

## 2017-12-04 ENCOUNTER — Encounter: Payer: Self-pay | Admitting: Family Medicine

## 2017-12-04 DIAGNOSIS — M5430 Sciatica, unspecified side: Secondary | ICD-10-CM

## 2017-12-04 NOTE — Patient Instructions (Signed)
The bruise should gradually spread out and resolve.  It may move down the leg.  Finish the prednisone and then later on try to taper the gabapentin.   Update me as needed.  Take care.  Glad to see you.

## 2017-12-04 NOTE — Telephone Encounter (Signed)
Thanks

## 2017-12-04 NOTE — Progress Notes (Signed)
Follow-up.  Last dose of prednisone will be tomorrow. His pain is clearly better.  He has bruising on the medial thigh, noted yesterday, moving farther down the leg today.  The bruise is lighter today with with some yellowing on the periphery.    No FCNAVD.  No bleeding o/w, no blood in urine or stool, no nose bleeds, no gum bleeding.    He is taking 1 gabapentin in the AM and 2 in the PM.    Meds, vitals, and allergies reviewed.   ROS: Per HPI unless specifically indicated in ROS section   nad ncat rrr ctab Abd soft, not ttp Back not tender to palpation in the midline. Straight leg raise negative bilaterally. He has some medial bruising on the right upper thigh, appears to be spreading down the leg toward the medial portion of the lower leg.  No fluctuant mass. Able to bear weight. He is clearly moving better today compared to previous office visit.

## 2017-12-05 NOTE — Assessment & Plan Note (Addendum)
He may have had 2 separate issues.  He may have had sciatica and he may have also had a small partial tear in an abductor muscle in the right medial thigh.  The prednisone and gabapentin likely would have help with the sciatica pain.  He clearly seems to be better from that.  The bruising from a muscle tear may not have been apparent at first.  This appears to be spreading down the leg, as expected with gravity.  Discussed with patient.  That should gradually resolve.  I would continue as is for now and update me as needed.  He agrees.  Supportive treatment otherwise with gentle stretching and return to activity.  I do not see any evidence of a full thickness tear of the muscle with a palpable contracted muscle body.

## 2017-12-11 ENCOUNTER — Ambulatory Visit: Payer: Self-pay | Admitting: Family Medicine

## 2017-12-11 MED ORDER — NYSTATIN 100000 UNIT/ML MT SUSP
5.0000 mL | Freq: Four times a day (QID) | OROMUCOSAL | 1 refills | Status: DC
Start: 2017-12-11 — End: 2017-12-19

## 2017-12-11 NOTE — Telephone Encounter (Signed)
Try using nystatin.  rx sent.  Make sure to rinse after using advair.  If not better, then needs to get checked.  Thanks.

## 2017-12-11 NOTE — Telephone Encounter (Signed)
PEC Sarah RN also commented;  Pt refusing disposition. Pt is asking for a prescription for thrush to be called in to the pharmacy.   Routing comment

## 2017-12-11 NOTE — Telephone Encounter (Signed)
Pt's wife c/o (wife was with pt at time of call). Pt has a whitish yellow film covering his tongue. Symptoms began Sunday. Wife stated that pt tongue is inflamed and it is making it harder to eat food. Wife noted pt with bad breath. Pt's wife thinks it is coming form his Advair. Wife stated pt denies fever, sore throat. Care advice given and pt's wife verbalized understanding.  Offered to make an appointment for pt to be evaluated but pt refused to make appointment. Pt's wife is asking if a prescription could be called in for them to pick up.   Reason for Disposition . White patches that stick to tongue or inner cheek  Answer Assessment - Initial Assessment Questions 1. SYMPTOM: "What's the main symptom you're concerned about?" (e.g., dry mouth. chapped lips, lump)     Bad breath, whitish yellow on tongue 2. ONSET: "When did the thrush  start?"     Sunday  3. PAIN: "Is there any pain?" If so, ask: "How bad is it?" (Scale: 1-10; mild, moderate, severe)     No more irritation than pain 4. CAUSE: "What do you think is causing the symptoms?"     Oral inhalers 5. OTHER SYMPTOMS: "Do you have any other symptoms?" (e.g., fever, sore throat, toothache, swelling)     no 6. PREGNANCY: "Is there any chance you are pregnant?" "When was your last menstrual period?"     n/a  Protocols used: MOUTH [facility]

## 2017-12-11 NOTE — Telephone Encounter (Signed)
Patient notified as instructed by telephone and verbalized understanding. 

## 2017-12-11 NOTE — Addendum Note (Signed)
Addended by: Tonia Ghent on: 12/11/2017 02:09 PM   Modules accepted: Orders

## 2017-12-18 ENCOUNTER — Telehealth: Payer: Self-pay | Admitting: Family Medicine

## 2017-12-18 NOTE — Telephone Encounter (Signed)
I spoke with Joseph Parker and pts tongue is swollen but not having any problem swallowing or breathing. Joseph Parker is concerned that pt has lost his appetite. Joseph Parker request cb.

## 2017-12-18 NOTE — Telephone Encounter (Signed)
Joseph Parker DPR signed notified Joseph Parker and voiced understanding. Joseph Parker said one side of tongue is irritated and not really swollen. Joseph Parker is concerned about pt losing appetite. Scheduled appt with Dr Darnell Level on 12/19/17 at 3:45 and ED precautions for swelling in tongue, lips,mouth,throat or difficulty breath pt will go to Ed. FYI to Dr Damita Dunnings and Dr Danise Mina.

## 2017-12-18 NOTE — Progress Notes (Signed)
BP 120/70 (BP Location: Left Arm, Patient Position: Sitting, Cuff Size: Normal)   Pulse (!) 55   Temp 97.8 F (36.6 C) (Oral)   Ht 5' 7.25" (1.708 m)   Wt 136 lb 8 oz (61.9 kg)   SpO2 100%   BMI 21.22 kg/m    CC: tongue irritation Subjective:    Patient ID: Joseph Parker, male    DOB: July 28, 1940, 77 y.o.   MRN: [id number]  HPI: Joseph Parker is a 77 y.o. male presenting on 12/19/2017 for Tongue Discomfort (C/o right side tongue discomfort, loss of appetite, right side head discomfort and right ear discomfort. Started about 2 wks ago. Recently treated with meds, no better. Requesting blood work. Pt accompanied by his wife. )   1 mo h/o R groin pain - pulled muscle, may have had pinched nerve in leg. Treated with 10d prednisone course as well as gabapentin 100mg  course. R inner leg also bruised. Prednisone significantly helped appetite.   Hasn't felt well all month. No appetite. Has backed off gabapentin (never really took a lot). Describes R tongue swelling and abnormal sensation. Barely ate anything today. Has had 2 normal bowel movements today. Increased early satiety over last 2 days.   Treated for thrush with nystatin solution without benefit. Some improvement in tongue film after this, but persistent abnormal sensation.   Denies fevers/chills, abd pain, nausea/vomiting, diarrhea constipation, cough, dyspnea, chest pain, head congestion, ST, PNDrainage. No trouble chewing (wears dentures). Denies dysphagia. No mouth sores.   Wife worried he had a stroke because he was talking funny.   Ongoing R head soreness that starts above temple, has R ear ache and achey R neck gland.   Known COPD ex smoker on advair and spiriva regularly.  Relevant past medical, surgical, family and social history reviewed and updated as indicated. Interim medical history since our last visit reviewed. Allergies and medications reviewed and updated. Outpatient Medications Prior to Visit    Medication Sig Dispense Refill  . albuterol (PROVENTIL HFA;VENTOLIN HFA) 108 (90 Base) MCG/ACT inhaler Inhale 2 puffs into the lungs every 6 (six) hours as needed for wheezing or shortness of breath. 1 Inhaler 12  . aspirin EC 81 MG tablet Take 1 tablet (81 mg total) by mouth daily.    Marland Kitchen atorvastatin (LIPITOR) 10 MG tablet Take 1 tablet (10 mg total) by mouth daily. 90 tablet 3  . Fluticasone-Salmeterol (ADVAIR DISKUS) 250-50 MCG/DOSE AEPB Inhale 1 puff into the lungs 2 (two) times daily. 60 each prn  . gabapentin (NEURONTIN) 100 MG capsule Take 1-3 capsules (100-300 mg total) by mouth 3 (three) times daily as needed. For pain. 90 capsule 1  . tiotropium (SPIRIVA) 18 MCG inhalation capsule Place 1 capsule (18 mcg total) into inhaler and inhale daily. 30 capsule 12  . nystatin (MYCOSTATIN) 100000 UNIT/ML suspension Take 5 mLs (500,000 Units total) by mouth 4 (four) times daily. Swish and swallow 120 mL 1  . predniSONE (DELTASONE) 20 MG tablet Take 2 a day for 5 days, then 1 a day for 5 days, with food. Don't take with aleve/ibuprofen. 15 tablet 0   No facility-administered medications prior to visit.      Per HPI unless specifically indicated in ROS section below Review of Systems     Objective:    BP 120/70 (BP Location: Left Arm, Patient Position: Sitting, Cuff Size: Normal)   Pulse (!) 55   Temp 97.8 F (36.6 C) (Oral)   Ht 5' 7.25" (1.708 m)  Wt 136 lb 8 oz (61.9 kg)   SpO2 100%   BMI 21.22 kg/m   Wt Readings from Last 3 Encounters:  12/19/17 136 lb 8 oz (61.9 kg)  12/04/17 141 lb 12 oz (64.3 kg)  11/26/17 140 lb (63.5 kg)    Physical Exam  Constitutional: He appears well-developed and well-nourished. No distress.  HENT:  Mouth/Throat: Uvula is midline and mucous membranes are normal. No oropharyngeal exudate, posterior oropharyngeal edema, posterior oropharyngeal erythema or tonsillar abscesses.  White/yellow film on tongue, does not easily scrape off with tongue  depressor Abnormal motion/deviation of tongue when asked to show tongue  Eyes: Pupils are equal, round, and reactive to light. Conjunctivae and EOM are normal.  Tongue deviated to left, ?growth L base of tongue   Neck: Normal range of motion. Neck supple. Carotid bruit is present (bilateral (chronic)). No thyromegaly present.  Cardiovascular: Normal rate, regular rhythm and normal heart sounds.  No murmur heard. Pulmonary/Chest: Effort normal and breath sounds normal. No respiratory distress. He has no wheezes. He has no rales.  Lymphadenopathy:    He has no cervical adenopathy.  Neurological: He is alert.  CN 2-12 intact EOMI  Nursing note and vitals reviewed.  Results for orders placed or performed in visit on 12/19/17  Comprehensive metabolic panel  Result Value Ref Range   Sodium 139 135 - 145 mEq/L   Potassium 4.0 3.5 - 5.1 mEq/L   Chloride 101 96 - 112 mEq/L   CO2 30 19 - 32 mEq/L   Glucose, Bld 87 70 - 99 mg/dL   BUN 11 6 - 23 mg/dL   Creatinine, Ser 0.92 0.40 - 1.50 mg/dL   Total Bilirubin 0.9 0.2 - 1.2 mg/dL   Alkaline Phosphatase 86 39 - 117 U/L   AST 17 0 - 37 U/L   ALT 8 0 - 53 U/L   Total Protein 7.5 6.0 - 8.3 g/dL   Albumin 4.5 3.5 - 5.2 g/dL   Calcium 10.0 8.4 - 10.5 mg/dL   GFR 84.43 >60.00 mL/min  CBC with Differential/Platelet  Result Value Ref Range   WBC 7.8 4.0 - 10.5 K/uL   RBC 4.70 4.22 - 5.81 Mil/uL   Hemoglobin 14.0 13.0 - 17.0 g/dL   HCT 40.8 39.0 - 52.0 %   MCV 86.7 78.0 - 100.0 fl   MCHC 34.3 30.0 - 36.0 g/dL   RDW 13.9 11.5 - 15.5 %   Platelets 258.0 150.0 - 400.0 K/uL   Neutrophils Relative % 69.5 43.0 - 77.0 %   Lymphocytes Relative 19.0 12.0 - 46.0 %   Monocytes Relative 8.6 3.0 - 12.0 %   Eosinophils Relative 1.9 0.0 - 5.0 %   Basophils Relative 1.0 0.0 - 3.0 %   Neutro Abs 5.4 1.4 - 7.7 K/uL   Lymphs Abs 1.5 0.7 - 4.0 K/uL   Monocytes Absolute 0.7 0.1 - 1.0 K/uL   Eosinophils Absolute 0.2 0.0 - 0.7 K/uL   Basophils Absolute 0.1 0.0  - 0.1 K/uL  Lipase  Result Value Ref Range   Lipase 11.0 11.0 - 59.0 U/L      Assessment & Plan:   Problem List Items Addressed This Visit    Loss of weight    Weight loss and decreased appetite over the last few days, wife concerned. Also endorses early satiety but no dysphagia, abd pain, nausea/vomiting, or fevers/night sweats. Merits close monitoring, consider abdominal imaging to evaluate pancreas. Start with labs (CBC, CMP, lipase).  Relevant Orders   Ambulatory referral to ENT   Carotid stenosis    Chronic, this is being monitored.       Anorexia    New. Will monitor for now.       Relevant Orders   Comprehensive metabolic panel (Completed)   CBC with Differential/Platelet (Completed)   Lipase (Completed)   Ambulatory referral to ENT   Abnormality of tongue - Primary    Not speaking clearly due to abnormal sensation of tongue but I don't see obvious swelling. However he does have abnormal movement of tongue when asked to show his tongue - I can't tell if this is new or chronic. He may have partially treated thrush - recommended refill nystatin swish/swallow for another course. Alternatively, nystatin may be contributing to GI symptoms (early satiety).  Non-focal neurological exam otherwise.  May need further imaging if speech difficulty not clearing (head CT).  There seems to be fullness/mass at base of L tongue - will refer to ENT for further evaluation of this.       Relevant Orders   Ambulatory referral to ENT    Other Visit Diagnoses    Early satiety       Need for influenza vaccination       Relevant Orders   Flu Vaccine QUAD 36+ mos IM (Completed)       No orders of the defined types were placed in this encounter.  Orders Placed This Encounter  Procedures  . Flu Vaccine QUAD 36+ mos IM  . Comprehensive metabolic panel  . CBC with Differential/Platelet  . Lipase  . Ambulatory referral to ENT    Referral Priority:   Routine    Referral Type:    Consultation    Referral Reason:   Specialty Services Required    Requested Specialty:   Otolaryngology    Number of Visits Requested:   1    Follow up plan: Return if symptoms worsen or fail to improve.  Ria Bush, MD

## 2017-12-18 NOTE — Telephone Encounter (Signed)
Copied from [place] [id number]. Topic: General - Other >> Dec 18, 2017  4:20 PM Yvette Rack wrote: Reason for CRM: Pt wife Bethena Roys states pt has been on the nystatin (MYCOSTATIN) 100000 UNIT/ML suspension for week now and his symptoms have not gotten any better. Bethena Roys states pt tongue is swollen and she is concerned because pt has now lost his appetite. Bethena Roys also stated pt has a refill on the medication but she is not sure if they should have it refilled. Bethena Roys requests a  call back. Cb# [phone]

## 2017-12-18 NOTE — Telephone Encounter (Signed)
Pt wife Bethena Roys states pt has been on the nystatin (MYCOSTATIN) 100000 UNIT/ML suspension for week now and his symptoms have not gotten any better. Bethena Roys states pt tongue is swollen and she is concerned because pt has now lost his appetite. Bethena Roys also stated pt has a refill on the medication but she is not sure if they should have it refilled. Bethena Roys requests a  call back. Cb# [phone]

## 2017-12-18 NOTE — Telephone Encounter (Signed)
If he has tongue irritation, then needs OV when possible.  If true tongue swelling, then needs ER eval.

## 2017-12-19 ENCOUNTER — Ambulatory Visit (INDEPENDENT_AMBULATORY_CARE_PROVIDER_SITE_OTHER): Payer: Medicare Other | Admitting: Family Medicine

## 2017-12-19 ENCOUNTER — Encounter: Payer: Self-pay | Admitting: Family Medicine

## 2017-12-19 VITALS — BP 120/70 | HR 55 | Temp 97.8°F | Ht 67.25 in | Wt 136.5 lb

## 2017-12-19 DIAGNOSIS — I6529 Occlusion and stenosis of unspecified carotid artery: Secondary | ICD-10-CM

## 2017-12-19 DIAGNOSIS — Q383 Other congenital malformations of tongue: Secondary | ICD-10-CM

## 2017-12-19 DIAGNOSIS — R630 Anorexia: Secondary | ICD-10-CM | POA: Diagnosis not present

## 2017-12-19 DIAGNOSIS — Z23 Encounter for immunization: Secondary | ICD-10-CM

## 2017-12-19 DIAGNOSIS — R634 Abnormal weight loss: Secondary | ICD-10-CM

## 2017-12-19 DIAGNOSIS — R6881 Early satiety: Secondary | ICD-10-CM | POA: Diagnosis not present

## 2017-12-19 NOTE — Telephone Encounter (Signed)
Thanks

## 2017-12-19 NOTE — Patient Instructions (Addendum)
Labs today. Continue nystatin swish and swallow.  We will refer you to ENT for further evaluation.  Try ensure or boost supplement for appetite one shake daily. Try listerine mouth wash for tongue.

## 2017-12-19 NOTE — Telephone Encounter (Addendum)
Will see then.  Recently treated with prednisone and gabapentin.

## 2017-12-20 ENCOUNTER — Telehealth: Payer: Self-pay

## 2017-12-20 DIAGNOSIS — K149 Disease of tongue, unspecified: Secondary | ICD-10-CM

## 2017-12-20 DIAGNOSIS — Q383 Other congenital malformations of tongue: Secondary | ICD-10-CM | POA: Insufficient documentation

## 2017-12-20 DIAGNOSIS — R630 Anorexia: Secondary | ICD-10-CM | POA: Insufficient documentation

## 2017-12-20 LAB — CBC WITH DIFFERENTIAL/PLATELET
BASOS PCT: 1 % (ref 0.0–3.0)
Basophils Absolute: 0.1 10*3/uL (ref 0.0–0.1)
EOS ABS: 0.2 10*3/uL (ref 0.0–0.7)
EOS PCT: 1.9 % (ref 0.0–5.0)
HCT: 40.8 % (ref 39.0–52.0)
Hemoglobin: 14 g/dL (ref 13.0–17.0)
LYMPHS ABS: 1.5 10*3/uL (ref 0.7–4.0)
Lymphocytes Relative: 19 % (ref 12.0–46.0)
MCHC: 34.3 g/dL (ref 30.0–36.0)
MCV: 86.7 fl (ref 78.0–100.0)
MONO ABS: 0.7 10*3/uL (ref 0.1–1.0)
Monocytes Relative: 8.6 % (ref 3.0–12.0)
NEUTROS PCT: 69.5 % (ref 43.0–77.0)
Neutro Abs: 5.4 10*3/uL (ref 1.4–7.7)
Platelets: 258 10*3/uL (ref 150.0–400.0)
RBC: 4.7 Mil/uL (ref 4.22–5.81)
RDW: 13.9 % (ref 11.5–15.5)
WBC: 7.8 10*3/uL (ref 4.0–10.5)

## 2017-12-20 LAB — LIPASE: LIPASE: 11 U/L (ref 11.0–59.0)

## 2017-12-20 LAB — COMPREHENSIVE METABOLIC PANEL
ALBUMIN: 4.5 g/dL (ref 3.5–5.2)
ALK PHOS: 86 U/L (ref 39–117)
ALT: 8 U/L (ref 0–53)
AST: 17 U/L (ref 0–37)
BUN: 11 mg/dL (ref 6–23)
CO2: 30 mEq/L (ref 19–32)
CREATININE: 0.92 mg/dL (ref 0.40–1.50)
Calcium: 10 mg/dL (ref 8.4–10.5)
Chloride: 101 mEq/L (ref 96–112)
GFR: 84.43 mL/min (ref >60.00)
GLUCOSE: 87 mg/dL (ref 70–99)
POTASSIUM: 4 meq/L (ref 3.5–5.1)
SODIUM: 139 meq/L (ref 135–145)
TOTAL PROTEIN: 7.5 g/dL (ref 6.0–8.3)
Total Bilirubin: 0.9 mg/dL (ref 0.2–1.2)

## 2017-12-20 MED ORDER — FIRST-DUKES MOUTHWASH MT SUSP: OROMUCOSAL | 0 refills | Status: DC

## 2017-12-20 NOTE — Telephone Encounter (Signed)
pts wife still very concerned about lack of appetite. Would like results of labs done on 12/19/17. Dr Darnell Level is out of office today. pts wife request to send note to Dr Damita Dunnings to review today and call Mrs Empey back today. [facility].

## 2017-12-20 NOTE — Telephone Encounter (Signed)
I put ENT referral in late today.  Labs returned normal. plz call for update. Recommend f/u in office if no improvement in tongue with nystatin swish swallow.  If they haven't refilled nystatin, would recommend they try duke's mouthwash sent to pharmacy (has hydrocortisone and benadryl in addition to nystatin).

## 2017-12-20 NOTE — Telephone Encounter (Signed)
Labs are fine.  I'll await input from Dr. Darnell Level but in the meantime would proceed with ENT referral- Dr. Darnell Level mentioned that in the [place] note.  Thanks.

## 2017-12-20 NOTE — Assessment & Plan Note (Addendum)
Weight loss and decreased appetite over the last few days, wife concerned. Also endorses early satiety but no dysphagia, abd pain, nausea/vomiting, or fevers/night sweats. Merits close monitoring, consider abdominal imaging to evaluate pancreas. Start with labs (CBC, CMP, lipase).

## 2017-12-20 NOTE — Assessment & Plan Note (Signed)
New. Will monitor for now.

## 2017-12-20 NOTE — Assessment & Plan Note (Addendum)
Not speaking clearly due to abnormal sensation of tongue but I don't see obvious swelling. However he does have abnormal movement of tongue when asked to show his tongue - I can't tell if this is new or chronic. He may have partially treated thrush - recommended refill nystatin swish/swallow for another course. Alternatively, nystatin may be contributing to GI symptoms (early satiety).  Non-focal neurological exam otherwise.  May need further imaging if speech difficulty not clearing (head CT).  There seems to be fullness/mass at base of L tongue - will refer to ENT for further evaluation of this.

## 2017-12-20 NOTE — Assessment & Plan Note (Signed)
Chronic, this is being monitored.

## 2017-12-20 NOTE — Telephone Encounter (Signed)
Wife advised and says no one has called about the ENT referral.  Please advise.

## 2017-12-21 NOTE — Telephone Encounter (Signed)
See below . Thanks

## 2017-12-21 NOTE — Telephone Encounter (Signed)
Wife advised and states she has already gotten the refill on the Nystatin but it really doesn't appear that it is helping that much.  Each time that I speak with the wife, she admits she is more concerned about his lack of appetite than anything else.  I suggested that part of his appetite problem may be connected with his issues with his tongue.  She will await the call about the ENT referral.

## 2017-12-25 ENCOUNTER — Telehealth: Payer: Self-pay | Admitting: *Deleted

## 2017-12-25 ENCOUNTER — Telehealth: Payer: Self-pay

## 2017-12-25 DIAGNOSIS — R6881 Early satiety: Secondary | ICD-10-CM

## 2017-12-25 DIAGNOSIS — R634 Abnormal weight loss: Secondary | ICD-10-CM

## 2017-12-25 NOTE — Telephone Encounter (Signed)
Sp[facility] to [place] [facility] who states that the magic mouthwash prescribed has been d/c. She is wanting to know what medication combination you would like for her to fill for pt. pls advise

## 2017-12-25 NOTE — Telephone Encounter (Signed)
I called and his wife answered.  I think it makes sense to get through the ENT appointment, then get the CT scan done thereafter.  I can recheck him here at the clinic when needed but it would be good to get the ENT and the CT appointments done first.  She'll pass the message along to the patient.    Rosaria Ferries- this CT needs to be done sooner.  If I need to change it to a stat order, I will do so.  Please let me know if I need to reorder.  Thanks.

## 2017-12-25 NOTE — Telephone Encounter (Signed)
Spoke with [facility] relaying Dr. Synthia Innocent message.  Says they will delete rx.

## 2017-12-25 NOTE — Telephone Encounter (Signed)
As no better, recommend CT scan abdomen to evaluate pancreas then afterwards f/u in office with PCP for re exam of mouth/tongue. Ordered.  Also plz check on ENT eval.

## 2017-12-25 NOTE — Telephone Encounter (Signed)
ENT Appointment scheduled for tomorrow with Dr Janace Hoard at 2:30pm, patients wife notified. CT Abd/Pel scheduled with [facility] for 01/07/18 at 4pm, soonest Appt available. Patients wife is wanting Dr Damita Dunnings to call them when possible.

## 2017-12-25 NOTE — Telephone Encounter (Signed)
Bethena Roys (DPR signed) left v/m requesting cb about recent visits;Judy request to speak with Lugene CMA. I spoke with Bethena Roys on 12/21/17 if Nystatin did not help to cb on 12/25/17. The Nystatin has not helped.Then D[facility] mouthwash was phoned in [facility] not [facility]. Bethena Roys has not heard back about the ENT referral. Pt has no appetite; pt was weighing 140 lb and now pt weighs 133 lb. pts tongue is still slightly swollen, no sores in mouth, food will go down but stomach feels really full. Pt also hurting at waist line on lt side. Pt is also appearing depressed to [place]. Pt does not know what else to do. Pt loves donuts and pt ate 1/2 a donut and felt like he was going to vomit. Since the blood testing was OK what is next step. Pt last saw Dr Darnell Level on 12/19/17. But Bethena Roys request this note go to Dr Damita Dunnings since he is the PCP. Bethena Roys wants Dr Damita Dunnings to know that pt did comment he wished the [facility] would take him because he cannot do anything anymore.Bethena Roys wants to know if Dr Damita Dunnings thinks the pt needs to see ENT provider.

## 2017-12-25 NOTE — Telephone Encounter (Signed)
Will hold on this for now. See other phone note.

## 2017-12-26 DIAGNOSIS — R638 Other symptoms and signs concerning food and fluid intake: Secondary | ICD-10-CM | POA: Diagnosis not present

## 2017-12-26 DIAGNOSIS — J387 Other diseases of larynx: Secondary | ICD-10-CM | POA: Diagnosis not present

## 2017-12-26 DIAGNOSIS — R471 Dysarthria and anarthria: Secondary | ICD-10-CM | POA: Diagnosis not present

## 2017-12-26 DIAGNOSIS — K148 Other diseases of tongue: Secondary | ICD-10-CM | POA: Diagnosis not present

## 2017-12-26 NOTE — Telephone Encounter (Signed)
Thank you for all the work you put into this.  I really appreciate it.

## 2017-12-26 NOTE — Telephone Encounter (Signed)
Called the [facility] and they moved the patient to this Friday 12/28/17 at 12:30pm for CT Abd/Pel with contrast.

## 2017-12-28 ENCOUNTER — Telehealth: Payer: Self-pay | Admitting: *Deleted

## 2017-12-28 ENCOUNTER — Ambulatory Visit (INDEPENDENT_AMBULATORY_CARE_PROVIDER_SITE_OTHER)
Admission: RE | Admit: 2017-12-28 | Discharge: 2017-12-28 | Disposition: A | Discharge status: Home / Self Care | Payer: Medicare Other | Source: Ambulatory Visit | Attending: Family Medicine | Admitting: Family Medicine

## 2017-12-28 DIAGNOSIS — R6881 Early satiety: Secondary | ICD-10-CM

## 2017-12-28 DIAGNOSIS — R634 Abnormal weight loss: Secondary | ICD-10-CM

## 2017-12-28 DIAGNOSIS — M899 Disorder of bone, unspecified: Secondary | ICD-10-CM

## 2017-12-28 DIAGNOSIS — N281 Cyst of kidney, acquired: Secondary | ICD-10-CM | POA: Diagnosis not present

## 2017-12-28 MED ORDER — IOPAMIDOL (ISOVUE-300) INJECTION 61%
100.0000 mL | Freq: Once | INTRAVENOUS | Status: AC | PRN
  Administered 2017-12-28: 100 mL via INTRAVENOUS

## 2017-12-28 NOTE — Telephone Encounter (Signed)
I called pt.  CT d/w pt.  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  I didn't expect this.   Refer to onc, will get SPEP and UPEP.  He has MRI pending per ENT.   I asked him to come in at 230 on Monday so we can talk in detail.  He agreed.  I answered all questions to the best of my ability.  I thanked him for taking the call.  He thanked me for calling him.

## 2017-12-28 NOTE — Telephone Encounter (Signed)
See result note.  

## 2017-12-28 NOTE — Telephone Encounter (Signed)
Stacey with imaging contacted office indicating STAT report available in [place]. There were some abnormal findings and wanted to advise Dr Darnell Level to review, asap

## 2017-12-31 ENCOUNTER — Encounter: Payer: Self-pay | Admitting: Family Medicine

## 2017-12-31 ENCOUNTER — Ambulatory Visit (INDEPENDENT_AMBULATORY_CARE_PROVIDER_SITE_OTHER): Payer: Medicare Other | Admitting: Family Medicine

## 2017-12-31 VITALS — BP 142/64 | HR 71 | Temp 97.6°F | Ht 67.25 in | Wt 133.5 lb

## 2017-12-31 DIAGNOSIS — M899 Disorder of bone, unspecified: Secondary | ICD-10-CM | POA: Diagnosis not present

## 2017-12-31 DIAGNOSIS — Z125 Encounter for screening for malignant neoplasm of prostate: Secondary | ICD-10-CM | POA: Diagnosis not present

## 2017-12-31 NOTE — Progress Notes (Signed)
Recent events discussed with patient.  When he initially presented he had symptoms attributed to sciatica on the right side.  He did not have any weight loss or tongue changes at that point.  He did not have any systemic symptoms.  He was started on prednisone and had some improvement.  Subsequently he had some bruising in the right thigh that was attributed to a pulled muscle but that also was gradually improving.  He still do not have weight loss at that point.  After that he began to have changes in his tongue and also changes in appetite.  He had some weight loss noted.  He has been seen by ENT for right tongue deviation.  He also had CAT scan done with abnormal bone lesions noted.  I called him about his CAT scan on Friday night.  We discussed.  He came in for follow-up today.  ENT is planning on ordering MRI of the brain because of the tongue deviation.  That has not yet been done.  In the meantime he still has a decrease in appetite.  He is eating some but generally small portions per meal. He has B lower back pain, L>R, in the AM, better with aspirin.  No R leg pain.  No fevers.  No sweats.    R tongue deviation.  Speech is still changed from prev.  "I'm swallowing about 70% as good as I could."   We talked about all of his situation and recent history in detail.  He was present along with his wife and their son.  His wife did not know about the follow-up appointment until this morning.  She has been really worried about her husband and went to the hospital on Friday night with an apparent episode of symptoms related to anxiety.  Discussed.  PMH and SH reviewed  ROS: Per HPI unless specifically indicated in ROS section   Meds, vitals, and allergies reviewed.   GEN: nad, alert and oriented, thin elderly male.  Tearful but regains composure. HEENT: mucous membranes moist.  He is able to move the tongue to either side but he has some baseline right tongue deviation.  Cranial nerves II  through XII intact otherwise with the exception of baseline decrease in hearing. NECK: supple w/o LA CV: rrr.  PULM: ctab, no inc wob ABD: soft, +bs EXT: no edema SKIN: no acute rash Back without midline pain.

## 2017-12-31 NOTE — Patient Instructions (Signed)
Go to the lab on the way out.  We'll contact you with your lab report.  We'll be checking on the MRI of your brain in the meantime.    Appointment Wednesday at the cancer center at [facility].    We'll go from there.   You may end up needing a PET scan, another CT, and possibly a biopsy.  The cancer center will likely check extra blood labs.    Drink enough fluid to keep your urine clear or light colored.  Snack on whatever food sounds good at the moment, especially protein (meats, boost, etc)  Take care.  Glad to see you.

## 2018-01-01 ENCOUNTER — Other Ambulatory Visit: Payer: Self-pay | Admitting: Otolaryngology

## 2018-01-01 DIAGNOSIS — M899 Disorder of bone, unspecified: Secondary | ICD-10-CM | POA: Insufficient documentation

## 2018-01-01 DIAGNOSIS — G523 Disorders of hypoglossal nerve: Secondary | ICD-10-CM

## 2018-01-01 LAB — PSA: PSA: 5.14 ng/mL — AB (ref 0.10–4.00)

## 2018-01-01 NOTE — Assessment & Plan Note (Signed)
He needs work-up.  We discussed.  The presumption is that he has some form of cancer but he needs a full work-up.  We asked for an appointment at the oncology clinic and that has been set for Wednesday.  Discussed.  In the meantime check SPEP, UPEP, PSA.  Rationale discussed with patient.  We will check on his MRI to see when that can be done to image his brain, given the baseline tongue deviation.  I did not yet reorder another MRI brain.  Discussed with patient that is likely going to need further imaging with either PET scan versus CT and potentially a biopsy.  He was understandably worried about the diagnosis.  We talked about this.  He asked if he would ever get his appetite back and I told him that at this point there was no reason to presume that he would never get his appetite back.  At this point the goal is to define the cause of the bony lesions and also address his tongue deviation.  Routine cautions given.  At this point he appears okay for outpatient follow-up.  >40 minutes spent in face to face time with patient, >50% spent in counselling or coordination of care.

## 2018-01-02 ENCOUNTER — Inpatient Hospital Stay: Payer: Medicare Other | Attending: Oncology | Admitting: Oncology

## 2018-01-02 ENCOUNTER — Inpatient Hospital Stay: Payer: Medicare Other

## 2018-01-02 ENCOUNTER — Encounter: Payer: Self-pay | Admitting: Oncology

## 2018-01-02 ENCOUNTER — Other Ambulatory Visit: Payer: Self-pay

## 2018-01-02 VITALS — BP 156/77 | HR 71 | Temp 96.2°F | Resp 18 | Ht 67.5 in | Wt 132.7 lb

## 2018-01-02 DIAGNOSIS — R6881 Early satiety: Secondary | ICD-10-CM

## 2018-01-02 DIAGNOSIS — R634 Abnormal weight loss: Secondary | ICD-10-CM | POA: Diagnosis not present

## 2018-01-02 DIAGNOSIS — M899 Disorder of bone, unspecified: Secondary | ICD-10-CM | POA: Diagnosis not present

## 2018-01-02 DIAGNOSIS — B370 Candidal stomatitis: Secondary | ICD-10-CM | POA: Diagnosis not present

## 2018-01-02 DIAGNOSIS — Z87891 Personal history of nicotine dependence: Secondary | ICD-10-CM | POA: Insufficient documentation

## 2018-01-02 LAB — PROTEIN ELECTROPHORESIS,RANDOM URN: Creatinine, Urine: 25 mg/dL (ref 20–320)

## 2018-01-02 LAB — PROTEIN ELECTROPHORESIS, SERUM
ALBUMIN ELP: 4.1 g/dL (ref 3.8–4.8)
ALPHA 1: 0.4 g/dL — AB (ref 0.2–0.3)
Alpha 2: 0.9 g/dL (ref 0.5–0.9)
BETA 2: 0.4 g/dL (ref 0.2–0.5)
Beta Globulin: 0.4 g/dL (ref 0.4–0.6)
Gamma Globulin: 0.8 g/dL (ref 0.8–1.7)
Total Protein: 7.1 g/dL (ref 6.1–8.1)

## 2018-01-02 LAB — URIC ACID: Uric Acid, Serum: 4.2 mg/dL (ref 3.7–8.6)

## 2018-01-02 LAB — HEPATIC FUNCTION PANEL
ALBUMIN: 4.7 g/dL (ref 3.5–5.0)
ALT: 10 U/L (ref 0–44)
AST: 21 U/L (ref 15–41)
Alkaline Phosphatase: 90 U/L (ref 38–126)
Bilirubin, Direct: 0.1 mg/dL (ref 0.0–0.2)
Indirect Bilirubin: 0.7 mg/dL (ref 0.3–0.9)
TOTAL PROTEIN: 7.7 g/dL (ref 6.5–8.1)
Total Bilirubin: 0.8 mg/dL (ref 0.3–1.2)

## 2018-01-02 LAB — LACTATE DEHYDROGENASE: LDH: 319 U/L — ABNORMAL HIGH (ref 98–192)

## 2018-01-02 NOTE — Progress Notes (Signed)
Hematology/Oncology Consult note [facility] [phone] [phone]) [phone]   Patient Care Team: Tonia Ghent, MD as PCP - General (Family Medicine) Eustace Moore, MD as Consulting Physician (Neurosurgery)  REFERRING PROVIDER: Tonia Ghent, MD CHIEF COMPLAINTS/REASON FOR VISIT:  Evaluation of bone lytic lesion.  HISTORY OF PRESENTING ILLNESS:  Joseph Parker is a  77 y.o.  male with PMH listed below who was referred to me for evaluation of bone lytic lesion.   patient  presented to primary care physician for evaluation of right side tongue discomfort, loss of appetite for 2 weeks.  As well as 1 months of right groin pain feels like a pulled muscle.  Patient was treated with 10 days of prednisone as well as gabapentin 100 mg daily. Patient reports that prednisone has significantly helped patient's appetite however he developed thrush while on prednisone.  He was treated with nystatin solution with some improvement.  Persistent abnormal sensation. Patient was referred to ENT for further evaluation.  MRI was scanned for right tongue deviation  Also had CT abdomen pelvis with contrast on 12/28/2017 which showed abdominal lytic bone lesion in the right lesser trochanter, right sacrum, right ilium and T12 vertebral body most concerning for malignancy secondary to multiple myeloma versus metastasis disease. Pathologic avulsion fracture of the right lesser trochanter.   Patient reports chronic lower back pain, no recent injury.  No aggravating or alleviating factors.  Weight loss about 8 pounds for the past 4 weeks. accompanied by son, and wife  Review of Systems  Constitutional: Positive for malaise/fatigue and weight loss. Negative for chills and fever.  HENT: Positive for sore throat. Negative for nosebleeds.   Eyes: Negative for double vision, photophobia and redness.  Respiratory: Negative for cough, shortness of breath and wheezing.     Cardiovascular: Negative for chest pain, palpitations and orthopnea.  Gastrointestinal: Negative for abdominal pain, blood in stool, nausea and vomiting.  Genitourinary: Negative for dysuria.  Musculoskeletal: Positive for back pain. Negative for myalgias and neck pain.  Skin: Negative for itching and rash.  Neurological: Negative for dizziness, tingling and tremors.       Decreased food taste.    Endo/Heme/Allergies: Negative for environmental allergies. Does not bruise/bleed easily.  Psychiatric/Behavioral: Negative for depression.    MEDICAL HISTORY:  Past Medical History:  Diagnosis Date  . Carotid arterial disease ([place])   . Carotid artery disease ([place])   . Chickenpox   . Colon polyps   . COPD (chronic obstructive pulmonary disease) ([place])   . Normal cardiac stress test    06/2013  . Shortness of breath dyspnea   . Urinary frequency   . Urinary hesitancy     SURGICAL HISTORY: Past Surgical History:  Procedure Laterality Date  . COLONOSCOPY    . ESOPHAGOGASTRODUODENOSCOPY    . HERNIA REPAIR     right inquinal - as child  . LUMBAR LAMINECTOMY/DECOMPRESSION MICRODISCECTOMY Left 10/14/2014   Procedure: Left Lumbar three-four extraforaminla microdiskectomy;  Surgeon: Eustace Moore, MD;  Location: [facility];  Service: Neurosurgery;  Laterality: Left;  Left L34 extraforaminal microdiskectomy  . POLYPECTOMY    . TONSILLECTOMY     remote - childhood  . UPPER GASTROINTESTINAL ENDOSCOPY      SOCIAL HISTORY: Social History   Socioeconomic History  . Marital status: Married    Spouse name: Not on file  . Number of children: 5  . Years of education: 38  . Highest education level: Not on file  Occupational History  . Occupation: mfg Librarian, academic    Comment: retired  Scientific laboratory technician  . Financial resource strain: Not on file  . Food insecurity:    Worry: Not on file    Inability: Not on file  . Transportation needs:    Medical: Not on file    Non-medical: Not on file   Tobacco Use  . Smoking status: Former Smoker    Packs/day: 0.50    Last attempt to quit: 07/20/1999    Years since quitting: 18.4  . Smokeless tobacco: Never Used  Substance and Sexual Activity  . Alcohol use: No    Alcohol/week: 0.0 standard drinks  . Drug use: No  . Sexual activity: Yes    Birth control/protection: Post-menopausal  Lifestyle  . Physical activity:    Days per week: Not on file    Minutes per session: Not on file  . Stress: Not on file  Relationships  . Social connections:    Talks on phone: Not on file    Gets together: Not on file    Attends religious service: Not on file    Active member of club or organization: Not on file    Attends meetings of clubs or organizations: Not on file    Relationship status: Not on file  . Intimate partner violence:    Fear of current or ex partner: Not on file    Emotionally abused: Not on file    Physically abused: Not on file    Forced sexual activity: Not on file  Other Topics Concern  . Not on file  Social History Narrative   HSG. Married '63.    Previous marriage - 3 years/divorce. 5 sons. 7 grandchildren.   Work - Financial planner - retired.    Active in community and in his church   [employer] fan    FAMILY HISTORY: Family History  Problem Relation Age of Onset  . Heart disease Mother        CABG; Valve replacement - died post-op  . Alzheimer's disease Father   . Diabetes Father   . Diabetes Son   . Colon cancer Sister   . Prostate cancer Neg Hx   . Colon polyps Neg Hx     ALLERGIES:  is allergic to penicillins and tessalon [benzonatate].  MEDICATIONS:  Current Outpatient Medications  Medication Sig Dispense Refill  . albuterol (PROVENTIL HFA;VENTOLIN HFA) 108 (90 Base) MCG/ACT inhaler Inhale 2 puffs into the lungs every 6 (six) hours as needed for wheezing or shortness of breath. 1 Inhaler 12  . aspirin EC 81 MG tablet Take 1 tablet (81 mg total) by mouth daily.    Marland Kitchen atorvastatin (LIPITOR) 10 MG tablet  Take 1 tablet (10 mg total) by mouth daily. 90 tablet 3  . Fluticasone-Salmeterol (ADVAIR DISKUS) 250-50 MCG/DOSE AEPB Inhale 1 puff into the lungs 2 (two) times daily. 60 each prn  . tiotropium (SPIRIVA) 18 MCG inhalation capsule Place 1 capsule (18 mcg total) into inhaler and inhale daily. 30 capsule 12  . Diphenhyd-Hydrocort-Nystatin (FIRST-DUKES MOUTHWASH) SUSP Equal parts - swish/swallow 15 mL every 4 hours PRN tongue pain (Patient not taking: Reported on 01/02/2018) 237 mL 0  . gabapentin (NEURONTIN) 100 MG capsule Take 1-3 capsules (100-300 mg total) by mouth 3 (three) times daily as needed. For pain. (Patient not taking: Reported on 01/02/2018) 90 capsule 1   No current facility-administered medications for this visit.      PHYSICAL EXAMINATION: ECOG PERFORMANCE STATUS: 1 - Symptomatic  but completely ambulatory Vitals:   01/02/18 1522  BP: (!) 156/77  Pulse: 71  Resp: 18  Temp: (!) 96.2 F (35.7 C)  SpO2: 96%   Filed Weights   01/02/18 1522  Weight: 132 lb 11.2 oz (60.2 kg)    Physical Exam  Constitutional: He is oriented to person, place, and time. No distress.  HENT:  Head: Normocephalic and atraumatic.  Mouth/Throat: Oropharynx is clear and moist.  + thrush  Eyes: Pupils are equal, round, and reactive to light. EOM are normal. No scleral icterus.  Neck: Neck supple.  Cardiovascular: Normal rate and regular rhythm.  Pulmonary/Chest: Effort normal. No respiratory distress. He has no wheezes.  Decreased breath sounds bilaterally  Abdominal: Soft. Bowel sounds are normal. He exhibits no distension and no mass. There is no tenderness.  Musculoskeletal: Normal range of motion. He exhibits no edema or deformity.  Neurological: He is alert and oriented to person, place, and time. No cranial nerve deficit. Coordination normal.  Skin: Skin is warm and dry. No rash noted. No erythema.  Psychiatric: He has a normal mood and affect. His behavior is normal. Thought content  normal.     LABORATORY DATA:  I have reviewed the data as listed Lab Results  Component Value Date   WBC 7.8 12/19/2017   HGB 14.0 12/19/2017   HCT 40.8 12/19/2017   MCV 86.7 12/19/2017   PLT 258.0 12/19/2017   Recent Labs    04/19/17 1151 12/19/17 1649 12/31/17 1531 01/02/18 1606  NA 138 139  --   --   K 4.2 4.0  --   --   CL 103 101  --   --   CO2 29 30  --   --   GLUCOSE 94 87  --   --   BUN 11 11  --   --   CREATININE 0.85 0.92  --   --   CALCIUM 9.7 10.0  --   --   PROT 7.6 7.5 7.1 7.7  ALBUMIN 4.1 4.5  --  4.7  AST 13 17  --  21  ALT 10 8  --  10  ALKPHOS 67 86  --  90  BILITOT 0.4 0.9  --  0.8  BILIDIR  --   --   --  0.1  IBILI  --   --   --  0.7   Iron/TIBC/Ferritin/ %Sat No results found for: IRON, TIBC, FERRITIN, IRONPCTSAT   05/01/2017 Cologuard negative.   RADIOGRAPHIC STUDIES: I have personally reviewed the radiological images as listed and agreed with the findings in the report. 12/28/2017 CT abdomen pelvis w contrast 1. Abnormal lytic bone lesions in the right lesser trochanter, right sacrum, right ilium and T12 vertebral body most concerning for malignancy secondary to multiple myeloma versus metastatic disease. 2. Pathologic avulsion fracture of the right lesser trochanter. 3. Bilateral renal cysts. 4.  Aortic Atherosclerosis (ICD10-I70.0).  ASSESSMENT & PLAN:  1. Bone lesion   2. Thrush   3. Weight loss   4. Early satiety   5. Former smoker    CT images was independently reviewed by me and discussed with patient, wife and son.  Suspect multiple myeloma vs metastatic disease.  PSA is elevated.  Former smoker, no recent chest CT.  Consider PET scan for further evaluation.   Check multiple myeloma panel, free light chain ratio. LDH, uric acid, beta 2 microglobulin,   Thrush, advise patient to continue nystatin swish and spit,  Check HIV and hepatitis  panel.    Orders Placed This Encounter  Procedures  . NM PET Image Initial (PI) Skull  Base To Thigh    Standing Status:   Future    Standing Expiration Date:   01/03/2019    Order Specific Question:   If indicated for the ordered procedure, I authorize the administration of a radiopharmaceutical per Radiology protocol    Answer:   Yes    Order Specific Question:   Preferred imaging location?    Answer:   [facility]    Order Specific Question:   Radiology Contrast Protocol - do NOT remove file path    Answer:   [email or web address]    Order Specific Question:   ** REASON FOR EXAM (FREE TEXT)    Answer:   bone lesions, elevated PSA  . Multiple Myeloma Panel (SPEP&IFE w/QIG)    Standing Status:   Future    Number of Occurrences:   1    Standing Expiration Date:   01/02/2019  . Kappa/lambda light chains    Standing Status:   Future    Number of Occurrences:   1    Standing Expiration Date:   01/02/2019  . Lactate dehydrogenase    Standing Status:   Future    Number of Occurrences:   1    Standing Expiration Date:   01/03/2019  . Uric acid    Standing Status:   Future    Number of Occurrences:   1    Standing Expiration Date:   01/03/2019  . Beta 2 microglobulin, serum    Standing Status:   Future    Number of Occurrences:   1    Standing Expiration Date:   01/03/2019  . HIV Antibody (routine testing w rflx)    Standing Status:   Future    Number of Occurrences:   1    Standing Expiration Date:   01/03/2019  . Hepatic function panel    Standing Status:   Future    Number of Occurrences:   1    Standing Expiration Date:   01/02/2019  We spent sufficient time to discuss many aspect of care, questions were answered to patient's satisfaction.  The patient knows to call the clinic with any problems questions or concerns.  Return of visit: to be determined.  Thank you for this kind referral and the opportunity to participate in the care of this patient. A copy of today's note is routed to referring provider  Total face to face encounter  time for this patient visit was 45 min. >50% of the time was  spent in counseling and coordination of care.    Earlie Server, MD, PhD Hematology Oncology [facility] at [facility] Pager- [phone] 01/02/2018

## 2018-01-02 NOTE — Progress Notes (Signed)
Patient here for follow up. Pt complains of soreness from right side of head to right ear. PT had abdominal CT last week. Has not had bowel movement since last Friday. Pt has had decreased appetite for a couple of weeks.

## 2018-01-03 ENCOUNTER — Telehealth: Payer: Self-pay | Admitting: Family Medicine

## 2018-01-03 LAB — KAPPA/LAMBDA LIGHT CHAINS
KAPPA, LAMDA LIGHT CHAIN RATIO: 0.94 (ref 0.26–1.65)
Kappa free light chain: 16.2 mg/L (ref 3.3–19.4)
Lambda free light chains: 17.3 mg/L (ref 5.7–26.3)

## 2018-01-03 LAB — BETA 2 MICROGLOBULIN, SERUM: BETA 2 MICROGLOBULIN: 1.9 mg/L (ref 0.6–2.4)

## 2018-01-03 LAB — HIV ANTIBODY (ROUTINE TESTING W REFLEX): HIV Screen 4th Generation wRfx: NONREACTIVE

## 2018-01-03 NOTE — Telephone Encounter (Signed)
I see that his MRI brain is scheduled on the 20th and the PET scan is scheduled for the 22nd.  I will await those results.  FYI.  Thanks.

## 2018-01-04 ENCOUNTER — Telehealth: Payer: Self-pay

## 2018-01-04 LAB — MULTIPLE MYELOMA PANEL, SERUM
ALBUMIN SERPL ELPH-MCNC: 3.7 g/dL (ref 2.9–4.4)
Albumin/Glob SerPl: 1.2 (ref 0.7–1.7)
Alpha 1: 0.3 g/dL (ref 0.0–0.4)
Alpha2 Glob SerPl Elph-Mcnc: 1 g/dL (ref 0.4–1.0)
B-GLOBULIN SERPL ELPH-MCNC: 1 g/dL (ref 0.7–1.3)
Gamma Glob SerPl Elph-Mcnc: 0.9 g/dL (ref 0.4–1.8)
Globulin, Total: 3.2 g/dL (ref 2.2–3.9)
IGA: 251 mg/dL (ref 61–437)
IgG (Immunoglobin G), Serum: 815 mg/dL (ref 700–1600)
IgM (Immunoglobulin M), Srm: 148 mg/dL — ABNORMAL HIGH (ref 15–143)
TOTAL PROTEIN ELP: 6.9 g/dL (ref 6.0–8.5)

## 2018-01-04 MED ORDER — POLYETHYLENE GLYCOL 3350 17 GM/SCOOP PO POWD: 17.0000 g | Freq: Two times a day (BID) | ORAL | Status: AC | PRN

## 2018-01-04 NOTE — Telephone Encounter (Signed)
Joseph Parker (DPR signed) said that pt had CT of abd done on 12/28/17 and pt has not had BM since. Pt is eating more and drinking more water. Pt took 2 correctol at 2:30 AM this morning; pt just took 2 more correctal tablets. Pt is not having abd pain but feels really full. Pt is worrying about no BM because prior to this pt was having daily BM. Joseph Parker request Dr Josefine Class advice of what can take either OTC or prescription med. Joseph Parker request cb. [facility].

## 2018-01-04 NOTE — Telephone Encounter (Signed)
I would try taking miralax 17g by mouth once to twice a day.  They can get it OTC.  Drink plenty of fluids with that.  If any fevers or other troubles then have them call the triage line.  Thanks.

## 2018-01-04 NOTE — Telephone Encounter (Signed)
Patient's wife notified as instructed by telephone and verbalized understanding. Patient's wife that that he is eating better since last in.

## 2018-01-06 NOTE — Telephone Encounter (Signed)
Noted. Thanks.  He has f/u imaging pending and I'll await those reports.

## 2018-01-07 ENCOUNTER — Inpatient Hospital Stay: Admission: RE | Admit: 2018-01-07 | Payer: Medicare Other | Source: Ambulatory Visit

## 2018-01-09 ENCOUNTER — Ambulatory Visit
Admission: RE | Admit: 2018-01-09 | Discharge: 2018-01-09 | Disposition: A | Discharge status: Home / Self Care | Payer: Medicare Other | Source: Ambulatory Visit | Attending: Otolaryngology | Admitting: Otolaryngology

## 2018-01-09 ENCOUNTER — Telehealth: Payer: Self-pay | Admitting: *Deleted

## 2018-01-09 DIAGNOSIS — R4781 Slurred speech: Secondary | ICD-10-CM | POA: Diagnosis not present

## 2018-01-09 DIAGNOSIS — G523 Disorders of hypoglossal nerve: Secondary | ICD-10-CM

## 2018-01-09 MED ORDER — GADOBENATE DIMEGLUMINE 529 MG/ML IV SOLN
13.0000 mL | Freq: Once | INTRAVENOUS | Status: AC | PRN
  Administered 2018-01-09: 13 mL via INTRAVENOUS

## 2018-01-09 NOTE — Telephone Encounter (Signed)
Joseph Parker called asking for results from 3 weeks ago stating she has called 3 times to request this information. This is the first message I have received from her. And he was here on the 13th.  lesion   Ref Range & Units 7d ago  IgG (Immunoglobin G), Serum 700 - 1,600 mg/dL 815   IgA 61 - 437 mg/dL 251   IgM (Immunoglobulin M), Srm 15 - 143 mg/dL 148High    Total Protein ELP 6.0 - 8.5 g/dL 6.9 VC  Albumin SerPl Elph-Mcnc 2.9 - 4.4 g/dL 3.7 VC  Alpha 1 0.0 - 0.4 g/dL 0.3 VC  Alpha2 Glob SerPl Elph-Mcnc 0.4 - 1.0 g/dL 1.0 VC  B-Globulin SerPl Elph-Mcnc 0.7 - 1.3 g/dL 1.0 VC  Gamma Glob SerPl Elph-Mcnc 0.4 - 1.8 g/dL 0.9 VC  M Protein SerPl Elph-Mcnc Not Observed g/dL Not Observed VC  Globulin, Total 2.2 - 3.9 g/dL 3.2 VC  Albumin/Glob SerPl 0.7 - 1.7 1.2 VC  IFE 1  Comment VC  Comment: An apparent normal immunofixation pattern.  Please Note  Comment VC  Comment: (NOTE)  Protein electrophoresis scan will follow via computer, mail, or  courier delivery.  Performed At: [facility]  [place], [place] [id number]  Rush Farmer MD [id number]:[id number]   Resulting Agency  [facility] LAB      Specimen Collected: 01/02/18 16:06 Last Resulted: 01/04/18 17:35          lesion   Ref Range & Units 7d ago  Kappa free light chain 3.3 - 19.4 mg/L 16.2   Lamda free light chains 5.7 - 26.3 mg/L 17.3   Kappa, lamda light chain ratio 0.26 - 1.65 0.94   Comment: (NOTE)  Performed At: [facility]  [place], [place] [id number]  Rush Farmer MD [id number]   Resulting Agency  [facility] CLIN LAB      Specimen Collected: 01/02/18 16:06 Last Resulted: 01/03/18 16:37       lesion   Ref Range & Units 7d ago  LDH 98 - 192 U/L 319High    Comment: Performed at [facility], [address] [place], [place] [address]  Resulting Agency  [facility] CLIN LAB      Specimen Collected: 01/02/18 16:06 Last Resulted: 01/02/18 16:49      lesion   Ref Range & Units 7d  ago  Uric Acid, Serum 3.7 - 8.6 mg/dL 4.2   Comment: Performed at [facility], [address] [place], [place] [address]  Resulting Agency  [facility] CLIN LAB      Specimen Collected: 01/02/18 16:06 Last Resulted: 01/02/18 16:45       lesion   Ref Range & Units 7d ago  Beta-2 Microglobulin 0.6 - 2.4 mg/L 1.9   Comment: (NOTE)  Siemens Immulite 2000 Immunochemiluminometric assay (ICMA)  Values obtained with different assay methods or kits cannot be used  interchangeably. Results cannot be interpreted as absolute evidence  of the presence or absence of malignant disease.  Performed At: [facility]  [place], [place] [id number]  Rush Farmer MD [id number]   Resulting Agency  [facility] CLIN LAB      Specimen Collected: 01/02/18 16:06 Last Resulted: 01/03/18 07:39         Thrush   Ref Range & Units 7d ago  HIV Screen 4th Generation wRfx Non Reactive Non Reactive   Comment: (NOTE)  Performed At: [facility]  [address] [place], [place] [id number]  Rush Farmer  MD [id number]   Resulting Agency  [facility] CLIN LAB      Specimen Collected: 01/02/18 16:06 Last Resulted: 01/03/18 06:37       Thrush   Ref Range & Units 7d ago  Total Protein 6.5 - 8.1 g/dL 7.7   Albumin 3.5 - 5.0 g/dL 4.7   AST 15 - 41 U/L 21   ALT 0 - 44 U/L 10   Alkaline Phosphatase 38 - 126 U/L 90   Total Bilirubin 0.3 - 1.2 mg/dL 0.8   Bilirubin, Direct 0.0 - 0.2 mg/dL 0.1   Indirect Bilirubin 0.3 - 0.9 mg/dL 0.7   Comment: Performed at [facility], [address], [place], [place] [address]  Resulting Agency  [facility] CLIN LAB      Specimen Collected: 01/02/18 16:06 Last Resulted: 01/02/18 16:49

## 2018-01-10 NOTE — Telephone Encounter (Signed)
Phone call returned to wife. Went over lab results and she states they will proceed with PET tomorrow as planned

## 2018-01-10 NOTE — Telephone Encounter (Signed)
Please let patient know that I have reviewed his labs and recommend him to proceed with PET scan and will be in touch with him about plan of biopsy. Thanks.

## 2018-01-11 ENCOUNTER — Encounter
Admission: RE | Admit: 2018-01-11 | Discharge: 2018-01-11 | Disposition: A | Discharge status: Home / Self Care | Payer: Medicare Other | Source: Ambulatory Visit | Attending: Oncology | Admitting: Oncology

## 2018-01-11 ENCOUNTER — Telehealth: Payer: Self-pay

## 2018-01-11 ENCOUNTER — Other Ambulatory Visit: Payer: Self-pay | Admitting: Oncology

## 2018-01-11 ENCOUNTER — Telehealth: Payer: Self-pay | Admitting: Family Medicine

## 2018-01-11 DIAGNOSIS — R948 Abnormal results of function studies of other organs and systems: Secondary | ICD-10-CM

## 2018-01-11 DIAGNOSIS — M899 Disorder of bone, unspecified: Secondary | ICD-10-CM | POA: Diagnosis not present

## 2018-01-11 DIAGNOSIS — R918 Other nonspecific abnormal finding of lung field: Secondary | ICD-10-CM | POA: Diagnosis not present

## 2018-01-11 LAB — GLUCOSE, CAPILLARY: Glucose-Capillary: 90 mg/dL (ref 70–99)

## 2018-01-11 MED ORDER — FLUDEOXYGLUCOSE F - 18 (FDG) INJECTION
6.8000 | Freq: Once | INTRAVENOUS | Status: AC | PRN
  Administered 2018-01-11: 7 via INTRAVENOUS

## 2018-01-11 NOTE — Telephone Encounter (Signed)
To recap, I talked with Dr. Tasia Catchings.   I looked at PET and MRI report.   We discussed both.  We agreed that I would call pt.  Dr. Tasia Catchings is going to check on biopsy options.    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    At this point, patient and wife will consider either going for biopsy or having a visit with Dr. Tasia Catchings to discuss the overall situation prior to any biopsy.    Patient and wife are aware that the lung cancer clinic staff may call in the meantime.   I would appreciate it (as would patient and wife) if staff from Dr. Collie Siad clinic would call patient on Monday.    I routed this to Dr. Tasia Catchings for input and as Juluis Rainier.  I thank all involved in the care of this kind gentleman.   I thanked pt/wife for taking the call and they thanked me for calling.

## 2018-01-11 NOTE — Telephone Encounter (Signed)
I was out of the office most of yesterday with a family emergency.  The MRI was not routed to me.   Even before this note, I was awaiting the PET from today to contact the patient.  See other phone note.

## 2018-01-11 NOTE — Telephone Encounter (Signed)
Please call the cancer clinic.  Dr. Earlie Server had seen patient and ordered the PET.   I wanted to talk to Dr. Tasia Catchings prior to calling the patient.   I thank all involved.  Thanks.

## 2018-01-11 NOTE — Telephone Encounter (Signed)
Message left for Dr. Tasia Catchings to return the call to Dr. Josefine Class cell phone.

## 2018-01-11 NOTE — Telephone Encounter (Signed)
FYI: Patient's wife called stating they were waiting for MRI results on the patient from 01/09/18. I advised wife that MRI was ordered by Dr Janace Hoard and they should be the ones to relay the results. Wife states that she called yesterday 01/10/18 to our office and was advised Dr Damita Dunnings would review but there was no message. I apologized to the wife and advised her if Dr Janace Hoard' office tells her differently to give [place] a call back. She is going to call Dr Janace Hoard office.

## 2018-01-14 ENCOUNTER — Telehealth: Payer: Self-pay | Admitting: Oncology

## 2018-01-14 NOTE — Telephone Encounter (Signed)
Called patient's home phone and talked to wife. Patient has hearing loss.  Discussed PET results and need of biopsy to establish tissue diagnosis. I will see patient after biopsy results come back to discuss results and mangement plan. Wife appreciates explaining and will relay to her husband.

## 2018-01-15 ENCOUNTER — Telehealth: Payer: Self-pay | Admitting: *Deleted

## 2018-01-15 ENCOUNTER — Other Ambulatory Visit: Payer: Self-pay | Admitting: Oncology

## 2018-01-15 MED ORDER — TRAMADOL HCL 50 MG PO TABS: 50.0000 mg | ORAL_TABLET | Freq: Four times a day (QID) | ORAL | 0 refills | Status: DC | PRN

## 2018-01-15 NOTE — Telephone Encounter (Signed)
Tramadol sent to his pharmacy.

## 2018-01-15 NOTE — Telephone Encounter (Signed)
Wife called reporting patient is in need of stronger pain medicine Please advise

## 2018-01-15 NOTE — Telephone Encounter (Signed)
Joseph Parker informed of medicine sent in to pharmacy

## 2018-01-16 ENCOUNTER — Telehealth: Payer: Self-pay | Admitting: *Deleted

## 2018-01-16 NOTE — Telephone Encounter (Signed)
Message left with patient regarding follow up appt scheduled with Dr. Tasia Catchings on 12/9 at 1:15pm. Informed that pt will be seeing Dr. Baruch Gouty after seeing Dr. Tasia Catchings on 12/9 to discuss radiation treatments. Appts have been mailed as well.

## 2018-01-19 ENCOUNTER — Encounter: Payer: Self-pay | Admitting: Oncology

## 2018-01-19 DIAGNOSIS — Z7189 Other specified counseling: Secondary | ICD-10-CM | POA: Insufficient documentation

## 2018-01-20 ENCOUNTER — Emergency Department: Payer: Medicare Other

## 2018-01-20 ENCOUNTER — Encounter: Payer: Self-pay | Admitting: Emergency Medicine

## 2018-01-20 ENCOUNTER — Telehealth: Payer: Self-pay | Admitting: Oncology

## 2018-01-20 ENCOUNTER — Emergency Department
Admission: EM | Admit: 2018-01-20 | Discharge: 2018-01-20 | Disposition: A | Discharge status: Home / Self Care | Payer: Medicare Other | Attending: Student in an Organized Health Care Education/Training Program | Admitting: Student in an Organized Health Care Education/Training Program

## 2018-01-20 DIAGNOSIS — R0602 Shortness of breath: Secondary | ICD-10-CM | POA: Diagnosis not present

## 2018-01-20 DIAGNOSIS — R079 Chest pain, unspecified: Secondary | ICD-10-CM

## 2018-01-20 DIAGNOSIS — Z79899 Other long term (current) drug therapy: Secondary | ICD-10-CM | POA: Insufficient documentation

## 2018-01-20 DIAGNOSIS — I2510 Atherosclerotic heart disease of native coronary artery without angina pectoris: Secondary | ICD-10-CM | POA: Insufficient documentation

## 2018-01-20 DIAGNOSIS — J449 Chronic obstructive pulmonary disease, unspecified: Secondary | ICD-10-CM | POA: Insufficient documentation

## 2018-01-20 DIAGNOSIS — Z7982 Long term (current) use of aspirin: Secondary | ICD-10-CM | POA: Diagnosis not present

## 2018-01-20 DIAGNOSIS — R0789 Other chest pain: Secondary | ICD-10-CM | POA: Insufficient documentation

## 2018-01-20 DIAGNOSIS — Z87891 Personal history of nicotine dependence: Secondary | ICD-10-CM | POA: Insufficient documentation

## 2018-01-20 DIAGNOSIS — C7802 Secondary malignant neoplasm of left lung: Secondary | ICD-10-CM | POA: Diagnosis not present

## 2018-01-20 HISTORY — DX: Malignant (primary) neoplasm, unspecified: C80.1

## 2018-01-20 LAB — COMPREHENSIVE METABOLIC PANEL
ALT: 12 U/L (ref 0–44)
AST: 29 U/L (ref 15–41)
Albumin: 4.3 g/dL (ref 3.5–5.0)
Alkaline Phosphatase: 92 U/L (ref 38–126)
Anion gap: 12 (ref 5–15)
BUN: 13 mg/dL (ref 8–23)
CHLORIDE: 97 mmol/L — AB (ref 98–111)
CO2: 27 mmol/L (ref 22–32)
CREATININE: 0.79 mg/dL (ref 0.61–1.24)
Calcium: 10.3 mg/dL (ref 8.9–10.3)
GFR calc Af Amer: >60 mL/min (ref >60)
GFR calc non Af Amer: >60 mL/min (ref >60)
GLUCOSE: 107 mg/dL — AB (ref 70–99)
Potassium: 4.3 mmol/L (ref 3.5–5.1)
SODIUM: 136 mmol/L (ref 135–145)
Total Bilirubin: 1.2 mg/dL (ref 0.3–1.2)
Total Protein: 7.8 g/dL (ref 6.5–8.1)

## 2018-01-20 LAB — CBC WITH DIFFERENTIAL/PLATELET
Abs Immature Granulocytes: 0.05 10*3/uL (ref 0.00–0.07)
BASOS PCT: 1 %
Basophils Absolute: 0.1 10*3/uL (ref 0.0–0.1)
EOS PCT: 1 %
Eosinophils Absolute: 0.1 10*3/uL (ref 0.0–0.5)
HCT: 37.8 % — ABNORMAL LOW (ref 39.0–52.0)
HEMOGLOBIN: 12.9 g/dL — AB (ref 13.0–17.0)
Immature Granulocytes: 1 %
LYMPHS PCT: 12 %
Lymphs Abs: 1.3 10*3/uL (ref 0.7–4.0)
MCH: 29.3 pg (ref 26.0–34.0)
MCHC: 34.1 g/dL (ref 30.0–36.0)
MCV: 85.7 fL (ref 80.0–100.0)
Monocytes Absolute: 1.2 10*3/uL — ABNORMAL HIGH (ref 0.1–1.0)
Monocytes Relative: 11 %
Neutro Abs: 8.3 10*3/uL — ABNORMAL HIGH (ref 1.7–7.7)
Neutrophils Relative %: 74 %
Platelets: 245 10*3/uL (ref 150–400)
RBC: 4.41 MIL/uL (ref 4.22–5.81)
RDW: 13.2 % (ref 11.5–15.5)
WBC: 11 10*3/uL — AB (ref 4.0–10.5)
nRBC: 0 % (ref 0.0–0.2)

## 2018-01-20 LAB — TROPONIN I: Troponin I: <0.03 ng/mL (ref <0.03)

## 2018-01-20 MED ORDER — LIDOCAINE 5 % EX PTCH: 1.0000 | MEDICATED_PATCH | Freq: Two times a day (BID) | CUTANEOUS | 0 refills | Status: DC

## 2018-01-20 MED ORDER — HYDROCODONE-ACETAMINOPHEN 5-325 MG PO TABS
1.0000 | ORAL_TABLET | Freq: Once | ORAL | Status: AC
  Administered 2018-01-20: 1 via ORAL
  Filled 2018-01-20: qty 1

## 2018-01-20 MED ORDER — HYDROCODONE-ACETAMINOPHEN 5-325 MG PO TABS: 1.0000 | ORAL_TABLET | Freq: Four times a day (QID) | ORAL | 0 refills | Status: DC | PRN

## 2018-01-20 MED ORDER — LIDOCAINE 5 % EX PTCH
1.0000 | MEDICATED_PATCH | CUTANEOUS | Status: DC
  Administered 2018-01-20: 1 via TRANSDERMAL
  Filled 2018-01-20: qty 1

## 2018-01-20 MED ORDER — IOHEXOL 350 MG/ML SOLN
75.0000 mL | Freq: Once | INTRAVENOUS | Status: AC | PRN
  Administered 2018-01-20: 75 mL via INTRAVENOUS

## 2018-01-20 NOTE — Telephone Encounter (Signed)
Wife called and reports patient has left lower chest pain and productive cough with yellowish sputum.  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 Biopsy is scheduled next Tuesday.  He takes Tramadol as instructed but not helped to relieve his pain. No reports of fever or chills.  Advise patient to come to ER for further evaluation.

## 2018-01-20 NOTE — ED Triage Notes (Signed)
PT arrived with family with concerns of shortness of breath and pain to his left side. Pt recently diagnosed with lung metastasis. Pt also diagnosed with a mass on pt's left lung. Pt states they are here today for pain management. Pt has significant pain with inspiration and expiration. Pain is only present with deep breathing and is absent when lying down or shallow breaths. Oncologist attributed pain to tumor location and prescribed tramadol which had not been effective in pain management.

## 2018-01-20 NOTE — ED Notes (Signed)
MD at bedside. 

## 2018-01-20 NOTE — ED Provider Notes (Signed)
[facility] Emergency Department Provider Note    First MD Initiated Contact with Patient 01/20/18 1506     (approximate)  I have reviewed the triage vital signs and the nursing notes.   HISTORY  Chief Complaint Shortness of Breath    HPI Joseph Parker is a 77 y.o. male with recent diagnosis of metastatic lung disease presents to the ER for several days of progressively worsening reproducible left anterior chest wall pain.  Denies any shortness of breath.  No fevers.  Is never had pain like this before.  Is not on any blood thinners.  States been taking tramadol without improvement in symptoms.  States he does have some exertional dyspnea.  Has also had a cough.    Past Medical History:  Diagnosis Date  . Cancer ([place])   . Carotid arterial disease ([place])   . Carotid artery disease ([place])   . Chickenpox   . Colon polyps   . COPD (chronic obstructive pulmonary disease) ([place])   . Normal cardiac stress test    06/2013  . Shortness of breath dyspnea   . Urinary frequency   . Urinary hesitancy    Family History  Problem Relation Age of Onset  . Heart disease Mother        CABG; Valve replacement - died post-op  . Alzheimer's disease Father   . Diabetes Father   . Diabetes Son   . Colon cancer Sister   . Prostate cancer Neg Hx   . Colon polyps Neg Hx    Past Surgical History:  Procedure Laterality Date  . COLONOSCOPY    . ESOPHAGOGASTRODUODENOSCOPY    . HERNIA REPAIR     right inquinal - as child  . LUMBAR LAMINECTOMY/DECOMPRESSION MICRODISCECTOMY Left 10/14/2014   Procedure: Left Lumbar three-four extraforaminla microdiskectomy;  Surgeon: Eustace Moore, MD;  Location: [place] [facility];  Service: Neurosurgery;  Laterality: Left;  Left L34 extraforaminal microdiskectomy  . POLYPECTOMY    . TONSILLECTOMY     remote - childhood  . UPPER GASTROINTESTINAL ENDOSCOPY     Patient Active Problem List   Diagnosis Date Noted  . Goals of care,  counseling/discussion 01/19/2018  . Bone lesion 01/01/2018  . Anorexia 12/20/2017  . 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 Sciatica 11/27/2017  . Carotid stenosis 04/22/2017  . Chronic obstructive pulmonary disease ([place]) 09/03/2016  . COPD exacerbation ([place]) 01/09/2016  . Colon cancer screening 11/11/2015  . Right hand pain 11/11/2015  . Cough 03/03/2015  . Loss of weight 01/06/2015  . Dysuria 10/29/2014  . S/P lumbar microdiscectomy 10/14/2014  . Advance care planning 05/27/2014  . Iliotibial band syndrome 04/07/2014  . CAD (coronary artery disease), native coronary artery 06/11/2013  . Decreased exercise tolerance 06/10/2013  . Nodule of right lung 10/25/2011  . Emphysema lung ([place]) 07/20/2011  . Medicare annual wellness visit, initial 07/20/2011      Prior to Admission medications   Medication Sig Start Date End Date Taking? Authorizing Provider  albuterol (PROVENTIL HFA;VENTOLIN HFA) 108 (90 Base) MCG/ACT inhaler Inhale 2 puffs into the lungs every 6 (six) hours as needed for wheezing or shortness of breath. 04/19/17   Tonia Ghent, MD  aspirin EC 81 MG tablet Take 1 tablet (81 mg total) by mouth daily. 06/11/13   Belva Crome, MD  atorvastatin (LIPITOR) 10 MG tablet Take 1 tablet (10 mg total) by mouth daily. 04/25/17   Tonia Ghent, MD  Diphenhyd-Hydrocort-Nystatin (FIRST-DUKES MOUTHWASH) SUSP Equal parts -  swish/swallow 15 mL every 4 hours PRN tongue pain Patient not taking: Reported on 01/02/2018 12/20/17   Ria Bush, MD  Fluticasone-Salmeterol (ADVAIR DISKUS) 250-50 MCG/DOSE AEPB Inhale 1 puff into the lungs 2 (two) times daily. 04/19/17   Tonia Ghent, MD  gabapentin (NEURONTIN) 100 MG capsule Take 1-3 capsules (100-300 mg total) by mouth 3 (three) times daily as needed. For pain. Patient not taking: Reported on 01/02/2018 11/26/17   Tonia Ghent, MD  HYDROcodone-acetaminophen [facility]) 5-325 MG tablet Take 1 tablet by mouth every 6 (six) hours as needed  for severe pain. 01/20/18   Merlyn Lot, MD  lidocaine (LIDODERM) 5 % Place 1 patch onto the skin every 12 (twelve) hours. Remove & Discard patch within 12 hours or as directed by MD 01/20/18 01/20/19  Merlyn Lot, MD  polyethylene glycol powder (GLYCOLAX/MIRALAX) powder Take 17 g by mouth 2 (two) times daily as needed. 01/04/18   Tonia Ghent, MD  tiotropium (SPIRIVA) 18 MCG inhalation capsule Place 1 capsule (18 mcg total) into inhaler and inhale daily. 03/18/17   Tonia Ghent, MD  traMADol (ULTRAM) 50 MG tablet Take 1 tablet (50 mg total) by mouth every 6 (six) hours as needed for moderate pain or severe pain. 01/15/18   Earlie Server, MD    Allergies Penicillins and Tessalon [benzonatate]    Social History Social History   Tobacco Use  . Smoking status: Former Smoker    Packs/day: 0.50    Last attempt to quit: 07/20/1999    Years since quitting: 18.5  . Smokeless tobacco: Never Used  Substance Use Topics  . Alcohol use: No    Alcohol/week: 0.0 standard drinks  . Drug use: No    Review of Systems Patient denies headaches, rhinorrhea, blurry vision, numbness, shortness of breath, chest pain, edema, cough, abdominal pain, nausea, vomiting, diarrhea, dysuria, fevers, rashes or hallucinations unless otherwise stated above in HPI. ____________________________________________   PHYSICAL EXAM:  VITAL SIGNS: Vitals:   01/20/18 1700 01/20/18 1730  BP: 126/67 111/71  Pulse: 74 77  Resp: 19 19  Temp:    SpO2: 90% 94%    Constitutional: Alert and oriented.  Eyes: Conjunctivae are normal.  Head: Atraumatic. Nose: No congestion/rhinnorhea. Mouth/Throat: Mucous membranes are moist.   Neck: No stridor. Painless ROM.  Cardiovascular: Normal rate, regular rhythm. Grossly normal heart sounds.  Good peripheral circulation. Respiratory: Normal respiratory effort.  No retractions. Lungs CTAB. Gastrointestinal: Soft and nontender. No distention. No abdominal bruits. No CVA  tenderness. Genitourinary:  Musculoskeletal: pain reproducible with palpation of left anterior chest wall.  No deformity noted. No lower extremity tenderness nor edema.  No joint effusions. Neurologic:  Normal speech and language. No gross focal neurologic deficits are appreciated. No facial droop Skin:  Skin is warm, dry and intact. No rash noted. Psychiatric: Mood and affect are normal. Speech and behavior are normal.  ____________________________________________   LABS (all labs ordered are listed, but only abnormal results are displayed)  Results for orders placed or performed during the hospital encounter of 01/20/18 (from the past 24 hour(s))  CBC with Differential     Status: Abnormal   Collection Time: 01/20/18 12:52 PM  Result Value Ref Range   WBC 11.0 (H) 4.0 - 10.5 K/uL   RBC 4.41 4.22 - 5.81 MIL/uL   Hemoglobin 12.9 (L) 13.0 - 17.0 g/dL   HCT 37.8 (L) 39.0 - 52.0 %   MCV 85.7 80.0 - 100.0 fL   MCH 29.3 26.0 - 34.0  pg   MCHC 34.1 30.0 - 36.0 g/dL   RDW 13.2 11.5 - 15.5 %   Platelets 245 150 - 400 K/uL   nRBC 0.0 0.0 - 0.2 %   Neutrophils Relative % 74 %   Neutro Abs 8.3 (H) 1.7 - 7.7 K/uL   Lymphocytes Relative 12 %   Lymphs Abs 1.3 0.7 - 4.0 K/uL   Monocytes Relative 11 %   Monocytes Absolute 1.2 (H) 0.1 - 1.0 K/uL   Eosinophils Relative 1 %   Eosinophils Absolute 0.1 0.0 - 0.5 K/uL   Basophils Relative 1 %   Basophils Absolute 0.1 0.0 - 0.1 K/uL   Immature Granulocytes 1 %   Abs Immature Granulocytes 0.05 0.00 - 0.07 K/uL  Comprehensive metabolic panel     Status: Abnormal   Collection Time: 01/20/18 12:52 PM  Result Value Ref Range   Sodium 136 135 - 145 mmol/L   Potassium 4.3 3.5 - 5.1 mmol/L   Chloride 97 (L) 98 - 111 mmol/L   CO2 27 22 - 32 mmol/L   Glucose, Bld 107 (H) 70 - 99 mg/dL   BUN 13 8 - 23 mg/dL   Creatinine, Ser 0.79 0.61 - 1.24 mg/dL   Calcium 10.3 8.9 - 10.3 mg/dL   Total Protein 7.8 6.5 - 8.1 g/dL   Albumin 4.3 3.5 - 5.0 g/dL   AST 29  15 - 41 U/L   ALT 12 0 - 44 U/L   Alkaline Phosphatase 92 38 - 126 U/L   Total Bilirubin 1.2 0.3 - 1.2 mg/dL   GFR calc non Af Amer >60 >60 mL/min   GFR calc Af Amer >60 >60 mL/min   Anion gap 12 5 - 15  Troponin I - ONCE - STAT     Status: None   Collection Time: 01/20/18  5:05 PM  Result Value Ref Range   Troponin I <0.03 <0.03 ng/mL   ____________________________________________  EKG My review and personal interpretation at Time: 12:55   Indication: chest pain  Rate: 90  Rhythm: sinus Axis: normal Other: normal intervals, no stemi ____________________________________________  RADIOLOGY  I personally reviewed all radiographic images ordered to evaluate for the above acute complaints and reviewed radiology reports and findings.  These findings were personally discussed with the patient.  Please see medical record for radiology report.  ____________________________________________   PROCEDURES  Procedure(s) performed:  Procedures    Critical Care performed: no ____________________________________________   INITIAL IMPRESSION / ASSESSMENT AND PLAN / ED COURSE  Pertinent labs & imaging results that were available during my care of the patient were reviewed by me and considered in my medical decision making (see chart for details).   DDX: pna, copd, msk strain, mass, pe  GARRET TEALE is a 77 y.o. who presents to the ED with symptoms as described above.  Patient nontoxic and not febrile.  Exam she does show reproducible left anterior chest wall pain.  Doubt ACS.  EKG shows no evidence of acute ischemia.  Troponin negative.  He has high risk with recent diagnosis of malignancy CT angiogram was ordered to evaluate for PE or dissection.   Stable mass there is a small no effusion.  Patient does not have any hypoxia.  Pain was improved after Lidoderm patch and Norco.  I will discharge home with Lidoderm and Norco with close outpatient follow up.  Have discussed with the  patient and available family all diagnostics and treatments performed thus far and all questions were answered  to the best of my ability. The patient demonstrates understanding and agreement with plan.       As part of my medical decision making, I reviewed the following data within the [facility] notes reviewed and incorporated, Labs reviewed, notes from prior ED visits.   ____________________________________________   FINAL CLINICAL IMPRESSION(S) / ED DIAGNOSES  Final diagnoses:  Chest pain, unspecified type      NEW MEDICATIONS STARTED DURING THIS VISIT:  New Prescriptions   HYDROCODONE-ACETAMINOPHEN (NORCO) 5-325 MG TABLET    Take 1 tablet by mouth every 6 (six) hours as needed for severe pain.   LIDOCAINE (LIDODERM) 5 %    Place 1 patch onto the skin every 12 (twelve) hours. Remove & Discard patch within 12 hours or as directed by MD     Note:  This document was prepared using Dragon voice recognition software and may include unintentional dictation errors.    Merlyn Lot, MD 01/20/18 [id number]

## 2018-01-20 NOTE — ED Notes (Signed)
First Nurse Note: Pt to ED c/o chest congestion and shortness of breath. Pt recently diagnosed with lung mass. Pt is in NAD at this time.

## 2018-01-20 NOTE — ED Notes (Signed)
PT states seeing oncology for new lung mass.  Due for bx on Tuesday.  Family states increasing SHOB especially with minimal exertion.  Pt states copious amounts of yellow phlegm with some mild blood tinged.  Spoke with oncologist who advised to come to ER for evaluation.

## 2018-01-20 NOTE — Discharge Instructions (Addendum)
Please follow-up with oncology.  Return to the ER if you develop any worsening pain, sweating, difficulty keeping food down or for any additional questions or concerns.

## 2018-01-21 ENCOUNTER — Other Ambulatory Visit: Payer: Self-pay | Admitting: Physician Assistant

## 2018-01-22 ENCOUNTER — Other Ambulatory Visit: Payer: Self-pay

## 2018-01-22 ENCOUNTER — Ambulatory Visit
Admission: RE | Admit: 2018-01-22 | Discharge: 2018-01-22 | Disposition: A | Discharge status: Home / Self Care | Payer: Medicare Other | Source: Ambulatory Visit | Attending: Oncology | Admitting: Oncology

## 2018-01-22 DIAGNOSIS — Z800 Family history of malignant neoplasm of digestive organs: Secondary | ICD-10-CM | POA: Insufficient documentation

## 2018-01-22 DIAGNOSIS — C7951 Secondary malignant neoplasm of bone: Secondary | ICD-10-CM | POA: Diagnosis not present

## 2018-01-22 DIAGNOSIS — Z7951 Long term (current) use of inhaled steroids: Secondary | ICD-10-CM | POA: Diagnosis not present

## 2018-01-22 DIAGNOSIS — Z79899 Other long term (current) drug therapy: Secondary | ICD-10-CM | POA: Insufficient documentation

## 2018-01-22 DIAGNOSIS — Z7982 Long term (current) use of aspirin: Secondary | ICD-10-CM | POA: Diagnosis not present

## 2018-01-22 DIAGNOSIS — Z87891 Personal history of nicotine dependence: Secondary | ICD-10-CM | POA: Insufficient documentation

## 2018-01-22 DIAGNOSIS — R918 Other nonspecific abnormal finding of lung field: Secondary | ICD-10-CM | POA: Insufficient documentation

## 2018-01-22 DIAGNOSIS — Z880 Allergy status to penicillin: Secondary | ICD-10-CM | POA: Insufficient documentation

## 2018-01-22 DIAGNOSIS — R948 Abnormal results of function studies of other organs and systems: Secondary | ICD-10-CM

## 2018-01-22 DIAGNOSIS — Z833 Family history of diabetes mellitus: Secondary | ICD-10-CM | POA: Insufficient documentation

## 2018-01-22 DIAGNOSIS — Z8249 Family history of ischemic heart disease and other diseases of the circulatory system: Secondary | ICD-10-CM | POA: Diagnosis not present

## 2018-01-22 DIAGNOSIS — M899 Disorder of bone, unspecified: Secondary | ICD-10-CM | POA: Diagnosis not present

## 2018-01-22 DIAGNOSIS — Z888 Allergy status to other drugs, medicaments and biological substances status: Secondary | ICD-10-CM | POA: Diagnosis not present

## 2018-01-22 DIAGNOSIS — J449 Chronic obstructive pulmonary disease, unspecified: Secondary | ICD-10-CM | POA: Diagnosis not present

## 2018-01-22 DIAGNOSIS — C801 Malignant (primary) neoplasm, unspecified: Secondary | ICD-10-CM | POA: Insufficient documentation

## 2018-01-22 DIAGNOSIS — C799 Secondary malignant neoplasm of unspecified site: Secondary | ICD-10-CM | POA: Diagnosis not present

## 2018-01-22 MED ORDER — MIDAZOLAM HCL 5 MG/5ML IJ SOLN
INTRAMUSCULAR | Status: AC
  Filled 2018-01-22: qty 5

## 2018-01-22 MED ORDER — SODIUM CHLORIDE 0.9 % IV SOLN
INTRAVENOUS | Status: DC
  Administered 2018-01-22: 10:00:00 via INTRAVENOUS

## 2018-01-22 MED ORDER — FENTANYL CITRATE (PF) 100 MCG/2ML IJ SOLN
INTRAMUSCULAR | Status: AC | PRN
  Administered 2018-01-22 (×2): 50 ug via INTRAVENOUS

## 2018-01-22 MED ORDER — LIDOCAINE HCL (PF) 1 % IJ SOLN
INTRAMUSCULAR | Status: AC | PRN
  Administered 2018-01-22: 2 mL

## 2018-01-22 MED ORDER — FENTANYL CITRATE (PF) 100 MCG/2ML IJ SOLN
INTRAMUSCULAR | Status: AC
  Filled 2018-01-22: qty 2

## 2018-01-22 MED ORDER — MIDAZOLAM HCL 2 MG/2ML IJ SOLN
INTRAMUSCULAR | Status: AC | PRN
  Administered 2018-01-22: 1 mg via INTRAVENOUS

## 2018-01-22 NOTE — H&P (Signed)
Chief Complaint: Patient was seen in consultation today for No chief complaint on file.  at the request of Yu,Zhou  Referring Physician(s): Yu,Zhou  Supervising Physician: Marybelle Killings  Patient Status: [facility] - Out-pt  History of Present Illness: Joseph Parker is a 77 y.o. male with a lung mass and multiple lytic bone lesions who was referred for biopsy.  He is feeling well today and is ready for his biopsy.  Past Medical History:  Diagnosis Date  . Cancer ([place])   . Carotid arterial disease ([place])   . Carotid artery disease ([place])   . Chickenpox   . Colon polyps   . COPD (chronic obstructive pulmonary disease) ([place])   . Normal cardiac stress test    06/2013  . Shortness of breath dyspnea   . Urinary frequency   . Urinary hesitancy     Past Surgical History:  Procedure Laterality Date  . COLONOSCOPY    . ESOPHAGOGASTRODUODENOSCOPY    . HERNIA REPAIR     right inquinal - as child  . LUMBAR LAMINECTOMY/DECOMPRESSION MICRODISCECTOMY Left 10/14/2014   Procedure: Left Lumbar three-four extraforaminla microdiskectomy;  Surgeon: Eustace Moore, MD;  Location: [facility];  Service: Neurosurgery;  Laterality: Left;  Left L34 extraforaminal microdiskectomy  . POLYPECTOMY    . TONSILLECTOMY     remote - childhood  . UPPER GASTROINTESTINAL ENDOSCOPY      Allergies: Penicillins and Tessalon [benzonatate]  Medications: Prior to Admission medications   Medication Sig Start Date End Date Taking? Authorizing Provider  albuterol (PROVENTIL HFA;VENTOLIN HFA) 108 (90 Base) MCG/ACT inhaler Inhale 2 puffs into the lungs every 6 (six) hours as needed for wheezing or shortness of breath. 04/19/17  Yes Tonia Ghent, MD  aspirin EC 81 MG tablet Take 1 tablet (81 mg total) by mouth daily. 06/11/13  Yes Belva Crome, MD  atorvastatin (LIPITOR) 10 MG tablet Take 1 tablet (10 mg total) by mouth daily. 04/25/17  Yes Tonia Ghent, MD  Fluticasone-Salmeterol (ADVAIR DISKUS) 250-50  MCG/DOSE AEPB Inhale 1 puff into the lungs 2 (two) times daily. 04/19/17  Yes Tonia Ghent, MD  HYDROcodone-acetaminophen (NORCO) 5-325 MG tablet Take 1 tablet by mouth every 6 (six) hours as needed for severe pain. 01/20/18  Yes Merlyn Lot, MD  lidocaine (LIDODERM) 5 % Place 1 patch onto the skin every 12 (twelve) hours. Remove & Discard patch within 12 hours or as directed by MD 01/20/18 01/20/19 Yes Merlyn Lot, MD  Diphenhyd-Hydrocort-Nystatin (FIRST-DUKES MOUTHWASH) SUSP Equal parts - swish/swallow 15 mL every 4 hours PRN tongue pain Patient not taking: Reported on 01/02/2018 12/20/17   Ria Bush, MD  gabapentin (NEURONTIN) 100 MG capsule Take 1-3 capsules (100-300 mg total) by mouth 3 (three) times daily as needed. For pain. Patient not taking: Reported on 01/02/2018 11/26/17   Tonia Ghent, MD  polyethylene glycol powder (GLYCOLAX/MIRALAX) powder Take 17 g by mouth 2 (two) times daily as needed. Patient not taking: Reported on 01/22/2018 01/04/18   Tonia Ghent, MD  tiotropium [facility]) 18 MCG inhalation capsule Place 1 capsule (18 mcg total) into inhaler and inhale daily. Patient not taking: Reported on 01/22/2018 03/18/17   Tonia Ghent, MD  traMADol (ULTRAM) 50 MG tablet Take 1 tablet (50 mg total) by mouth every 6 (six) hours as needed for moderate pain or severe pain. Patient not taking: Reported on 01/22/2018 01/15/18   Earlie Server, MD     Family History  Problem Relation Age of  Onset  . Heart disease Mother        CABG; Valve replacement - died post-op  . Alzheimer's disease Father   . Diabetes Father   . Diabetes Son   . Colon cancer Sister   . Prostate cancer Neg Hx   . Colon polyps Neg Hx     Social History   Socioeconomic History  . Marital status: Married    Spouse name: Not on file  . Number of children: 5  . Years of education: 75  . Highest education level: Not on file  Occupational History  . Occupation: mfg Librarian, academic    Comment:  retired  Scientific laboratory technician  . Financial resource strain: Not on file  . Food insecurity:    Worry: Not on file    Inability: Not on file  . Transportation needs:    Medical: Not on file    Non-medical: Not on file  Tobacco Use  . Smoking status: Former Smoker    Packs/day: 0.50    Last attempt to quit: 07/20/1999    Years since quitting: 18.5  . Smokeless tobacco: Never Used  Substance and Sexual Activity  . Alcohol use: No    Alcohol/week: 0.0 standard drinks  . Drug use: No  . Sexual activity: Yes    Birth control/protection: Post-menopausal  Lifestyle  . Physical activity:    Days per week: Not on file    Minutes per session: Not on file  . Stress: Not on file  Relationships  . Social connections:    Talks on phone: Not on file    Gets together: Not on file    Attends religious service: Not on file    Active member of club or organization: Not on file    Attends meetings of clubs or organizations: Not on file    Relationship status: Not on file  Other Topics Concern  . Not on file  Social History Narrative   HSG. Married '63.    Previous marriage - 3 years/divorce. 5 sons. 7 grandchildren.   Work - Financial planner - retired.    Active in community and in his church   [employer] fan     Review of Systems: A 12 point ROS discussed and pertinent positives are indicated in the HPI above.  All other systems are negative.  Review of Systems  Vital Signs: BP (!) 153/81   Pulse 84   Temp 98.4 F (36.9 C) (Oral)   Resp 12   Ht '5\' 7"'  (1.702 m)   Wt 61.2 kg   SpO2 92%   BMI 21.14 kg/m   Physical Exam  Constitutional: He is oriented to person, place, and time. He appears well-developed and well-nourished.  HENT:  Head: Normocephalic and atraumatic.  Cardiovascular: Normal rate and regular rhythm.  Pulmonary/Chest: Effort normal and breath sounds normal.  Neurological: He is alert and oriented to person, place, and time.    Imaging: Dg Chest 2 View  Result Date:  01/20/2018 CLINICAL DATA:  Shortness of breath and left upper chest pain in a patient with lung carcinoma. EXAM: CHEST - 2 VIEW COMPARISON:  PET CT scan 01/11/2018. PA and lateral chest 09/01/2016. FINDINGS: The lungs are emphysematous. No consolidative process, pneumothorax or effusion. Mass lesion in the left mediastinum is identified as seen on the prior exams. Heart size is normal. No acute bony abnormality. Destructive lesion in the left transverse process of T2 is not visible on this exam. No acute bony abnormality is seen. IMPRESSION:  No acute disease. Mediastinal mass lesion on the left as seen on prior exams. Emphysema. Electronically Signed   By: Inge Rise M.D.   On: 01/20/2018 14:40   Ct Angio Chest Pe W And/or Wo Contrast  Result Date: 01/20/2018 CLINICAL DATA:  Shortness of breath and left chest pain today. History of lung cancer. EXAM: CT ANGIOGRAPHY CHEST WITH CONTRAST TECHNIQUE: Multidetector CT imaging of the chest was performed using the standard protocol during bolus administration of intravenous contrast. Multiplanar CT image reconstructions and MIPs were obtained to evaluate the vascular anatomy. CONTRAST:  75 mL OMNIPAQUE IOHEXOL 350 MG/ML SOLN COMPARISON:  PET CT scan 01/11/2018. FINDINGS: Cardiovascular: Satisfactory opacification of the pulmonary arteries to the segmental level. No evidence of pulmonary embolism. Normal heart size. No pericardial effusion. Mediastinum/Nodes: Again seen is a mass lesion centered in the mediastinum on the left which measures 5.4 x 3.9 cm on image 43, unchanged. As noted on the prior examination, the lesion is centered in the AP window. It abuts the right main pulmonary artery without invading it. The mass also surrounds the interlobar branch to the anterior left upper lobe. No right hilar lymphadenopathy is identified. The esophagus and thyroid gland appear normal. Lungs/Pleura: The patient has a small left pleural effusion which is new since the  prior exam. The lungs demonstrate extensive centrilobular emphysema. A left upper lobe nodule on image 42 measuring 0.7 cm is unchanged. There also a few scattered ground-glass attenuation opacities in the anterior left upper lobe which are unchanged. The right lung is clear. Upper Abdomen: No acute abnormality. Cyst in the right kidney noted. Musculoskeletal: Destructive lesion in the left T2 transverse process is identified and measures approximately 3.5 x 2.0 cm on image 11 of series 7, not notably changed. Also seen is a large destructive lesion in the left side of the T12 vertebral body extending into the left pedicle, unchanged. No new bony abnormality is identified. Review of the MIP images confirms the above findings. IMPRESSION: Negative for pulmonary embolus or acute cardiopulmonary disease. Small left pleural effusion is new since the prior exam. No change in a left mediastinal mass lesion with metastatic deposits in the left side of T12 and left transverse process of T2. Aortic Atherosclerosis (ICD10-I70.0) and Severe emphysema (ICD10-J43.9). Electronically Signed   By: Inge Rise M.D.   On: 01/20/2018 16:40   Mr Jeri Cos ZE Contrast  Result Date: 01/09/2018 CLINICAL DATA:  Tongue swelling and slurred speech for 6 weeks. EXAM: MRI HEAD WITHOUT AND WITH CONTRAST TECHNIQUE: Multiplanar, multiecho pulse sequences of the brain and surrounding structures were obtained without and with intravenous contrast. CONTRAST:  63m MULTIHANCE GADOBENATE DIMEGLUMINE 529 MG/ML IV SOLN COMPARISON:  None. FINDINGS: Brain: No infarct, hemorrhage, hydrocephalus, or intra-axial mass. There is mild for age cerebral volume loss. No finding in the brainstem or cisterns to explain 12th nerve deficit. Vascular: Major flow voids are preserved Skull and upper cervical spine: There is a lesion centered in the right occipital condyle and lower right clivus with ventral extraosseous tumor extension and involvement of the  right hypoglossal canal. Sinuses/Orbits: Negative Other: The right posterior tongue is bulging posteriorly and there is increased T2 signal within the right tongue. IMPRESSION: 1. Aggressive bone lesion centered in the right occipital condyle with hypoglossal canal involvement and right tongue denervation. Based on abdominal CT 12/28/2017 this is most likely a metastatic focus. 2. No evidence of brain metastasis. Electronically Signed   By: JMonte FantasiaM.D.   On:  01/09/2018 14:46   Ct Abdomen Pelvis W Contrast  Result Date: 12/28/2017 CLINICAL DATA:  Early satiety.  Weight loss. EXAM: CT ABDOMEN AND PELVIS WITH CONTRAST TECHNIQUE: Multidetector CT imaging of the abdomen and pelvis was performed using the standard protocol following bolus administration of intravenous contrast. CONTRAST:  131m ISOVUE-300 IOPAMIDOL (ISOVUE-300) INJECTION 61% COMPARISON:  03/16/2003 FINDINGS: Lower chest: No acute abnormality. Hepatobiliary: No focal liver abnormality is seen. No gallstones, gallbladder wall thickening, or biliary dilatation. Pancreas: Unremarkable. No pancreatic ductal dilatation or surrounding inflammatory changes. Spleen: Normal in size without focal abnormality. Adrenals/Urinary Tract: Normal adrenal glands. 2.5 cm hypodense, fluid attenuating left inferior pole renal mass most consistent with a cyst. 2 cm hypodense, fluid attenuating left midpole renal mass most consistent with a cyst. 2.1 cm hypodense, fluid attenuating right upper pole renal mass most consistent with a cyst. No urolithiasis or obstructive uropathy. Bilateral renovascular calcifications. Diffuse bladder wall thickening which may be secondary to bladder outlet obstruction or other bladder wall abnormality. Stomach/Bowel: Stomach is within normal limits. Appendix appears normal. No evidence of bowel wall thickening, distention, or inflammatory changes. Vascular/Lymphatic: Normal caliber abdominal aorta with atherosclerosis. No  lymphadenopathy. Reproductive: Prostate is unremarkable. Other: No abdominal wall hernia or abnormality. No abdominopelvic ascites. Musculoskeletal: Avulsion fracture of the right lesser trochanter with superior displacement. Underlying abnormal bone lesion in right lesser trochanter. Abnormal lytic bone lesion in the right sacrum. Two abnormal lytic bone lesion in the right posterior ilium with cortical destruction. Abnormal lytic bone lesion in the T12 vertebral body extending into the left pedicle. IMPRESSION: 1. Abnormal lytic bone lesions in the right lesser trochanter, right sacrum, right ilium and T12 vertebral body most concerning for malignancy secondary to multiple myeloma versus metastatic disease. 2. Pathologic avulsion fracture of the right lesser trochanter. 3. Bilateral renal cysts. 4.  Aortic Atherosclerosis (ICD10-I70.0). Electronically Signed   By: HKathreen Devoid  On: 12/28/2017 16:06   Nm Pet Image Initial (pi) Skull Base To Thigh  Addendum Date: 01/11/2018   ADDENDUM REPORT: 01/11/2018 15:46 ADDENDUM: The original report was by Dr. WVan Clines The following addendum is by Dr. WVan Clines I discussed this case with Dr. YTasia Catchingsby telephone at 3:40 p.m. on 01/11/2018. One of the areas we focused our attention on was the left paraspinal mass in the upper thoracic spine region. This lesion has some very faint calcification along its lateral margin, and there is also a conspicuous absence of visualization of the left T2 transverse process. Accordingly, I suspect that this paraspinal mass was actually originally a metastatic lesion to the left T2 transverse process which is centrally has caused bony destruction of the transverse process. There is some slight erosion along the left T2 lamina and pedicle further supporting this as originally being an expansile bony lesion. In summary, the left paraspinal mass in the upper thoracic region is thought to have originally been a metastatic lesion  to the transverse process of T2. The patient also has other spinal lesions such as the left posterior vertebral body mass at T12, detailed in the original report. Electronically Signed   By: WVan ClinesM.D.   On: 01/11/2018 15:46   Result Date: 01/11/2018 CLINICAL DATA:  Initial treatment strategy for lung nodule. Lytic bone lesions. Elevated PSA level. EXAM: NUCLEAR MEDICINE PET SKULL BASE TO THIGH TECHNIQUE: 7.0 mCi F-18 FDG was injected intravenously. Full-ring PET imaging was performed from the skull vertex to thigh after the radiotracer. CT data was obtained and used for  attenuation correction and anatomic localization. Fasting blood glucose: 90 mg/dl COMPARISON:  Multiple exams, including CT abdomen 12/28/2017 and MRI brain from 01/09/2018 FINDINGS: Mediastinal blood pool activity: SUV max 2.2 NECK: No significant abnormal hypermetabolic activity in this region. Incidental CT findings: none CHEST: Left upper lobe anterior mediastinal mass is invading the AP window and measures approximately 5.4 by 4.0 cm with maximum SUV 18.4. This appears to be occluding the apicoposterior and anterior segmental branches of the left upper lobe bronchus. Satellite nodule measuring 6 mm in diameter just above the mass with maximum SUV 3.4. A left paraspinal mass posteriorly at the T1 level in the vicinity of the erector spinae muscle has a maximum SUV of 17.5 and has hypermetabolic activity measuring approximately 3.3 by 2.6 cm. Incidental CT findings: Coronary, aortic arch, and branch vessel atherosclerotic vascular disease. Severe centrilobular emphysema. Postobstructive pneumonitis medially in the left upper lobe suggested on image 146/3. ABDOMEN/PELVIS: No significant abnormal hypermetabolic activity in this region. Incidental CT findings: Bilateral photopenic renal cysts. Aortoiliac atherosclerotic vascular disease. There is wall thickening in the urinary bladder which could be from chronic outlet obstruction  from the prostatomegaly, strictly speaking bladder wall tumor is not excluded. SKELETON: Widespread hypermetabolic primarily lytic metastatic disease including the right occipital condyle; cervical, thoracic, and lumbar spine; bilateral scapula; right sternal body; multiple ribs; bilateral iliac bones; right sacrum; the left ischium; and both proximal femurs. A conglomerate right sacroiliac lesion with cortical destruction and soft tissue component measures approximately 4.8 by 5.7 cm on image 258/3, maximum SUV 28.0. A lytic lesion of the right lesser trochanter tracks up in the right iliopsoas muscle with calcified soft tissue component, maximum SUV 14.4. The right occipital condyle lytic lesion has a maximum SUV of 15.4. There is a lesion in the right femoral head and in the left femoral shaft, not current high risk for acute fracture. One of the larger spinal lesions is in the left posterior T12 vertebral body, maximum SUV 15.6, with some early cortical destruction both laterally on the left side and possibly along the posterior vertebral body, early epidural extension of tumor cannot be readily excluded. Likewise the lesion in the left sixth vertebral lamina is hypermetabolic with maximum SUV 8.3 and probably has epidural extension. There is a lytic lesion of the left acetabular quadrilateral plate with associated bony destruction and local soft tissue extension. Incidental CT findings: Aside from the various lytic lesions catalogued above, there is mild lumbar spondylosis and degenerative disc disease. IMPRESSION: 1. Large central left upper lobe mass with mediastinal invasion, associated with numerous osseous metastatic lesions and a left upper thoracic paraspinal metastatic lesion as detailed above. Appearance favors metastatic lung cancer. 2. Mildly irregular wall thickening in the urinary bladder could be due to chronic bladder outlet obstruction due to the prostatomegaly, but strictly speaking bladder  wall tumor cannot be readily excluded. 3. Other imaging findings of potential clinical significance: Aortic Atherosclerosis (ICD10-I70.0) and Emphysema (ICD10-J43.9). Coronary atherosclerosis. Electronically Signed: By: Van Clines M.D. On: 01/11/2018 11:43    Labs:  CBC: Recent Labs    12/19/17 1649 01/20/18 1252  WBC 7.8 11.0*  HGB 14.0 12.9*  HCT 40.8 37.8*  PLT 258.0 245    COAGS: No results for input(s): INR, APTT in the last 8760 hours.  BMP: Recent Labs    04/19/17 1151 12/19/17 1649 01/20/18 1252  NA 138 139 136  K 4.2 4.0 4.3  CL 103 101 97*  CO2 '29 30 27  ' GLUCOSE 94  87 107*  BUN '11 11 13  ' CALCIUM 9.7 10.0 10.3  CREATININE 0.85 0.92 0.79  GFRNONAA  --   --  >60  GFRAA  --   --  >60    LIVER FUNCTION TESTS: Recent Labs    04/19/17 1151 12/19/17 1649 12/31/17 1531 01/02/18 1606 01/20/18 1252  BILITOT 0.4 0.9  --  0.8 1.2  AST 13 17  --  21 29  ALT 10 8  --  10 12  ALKPHOS 67 86  --  90 92  PROT 7.6 7.5 7.1 7.7 7.8  ALBUMIN 4.1 4.5  --  4.7 4.3    TUMOR MARKERS: No results for input(s): AFPTM, CEA, CA199, CHROMGRNA in the last 8760 hours.  Assessment and Plan:  Lung mass and multiple bone lesions.  The right iliac bone lesion is amenable to percutaneous biopsy.  CT-guided biopsy is to follow.  Thank you for this interesting consult.  I greatly enjoyed meeting KELVIN SENNETT and look forward to participating in their care.  A copy of this report was sent to the requesting provider on this date.  Electronically Signed: Art A Saddie Sandeen, MD 01/22/2018, 11:50 AM   I spent a total of  40 Minutes   in face to face in clinical consultation, greater than 50% of which was counseling/coordinating care for CT-guided bone biopsy.

## 2018-01-22 NOTE — Procedures (Signed)
R iliac bone lesion core Bx 18 g times three EBL 0 Comp 0

## 2018-01-23 ENCOUNTER — Other Ambulatory Visit: Payer: Self-pay | Admitting: *Deleted

## 2018-01-23 MED ORDER — HYDROCODONE-ACETAMINOPHEN 5-325 MG PO TABS: 1.0000 | ORAL_TABLET | Freq: Four times a day (QID) | ORAL | 0 refills | Status: DC | PRN

## 2018-01-23 NOTE — Telephone Encounter (Signed)
Pt's wife requesting refill of pain medicine received from ED. States that medication is helping control pain and only has 1 pill left for today.

## 2018-01-28 ENCOUNTER — Ambulatory Visit
Admission: RE | Admit: 2018-01-28 | Discharge: 2018-01-28 | Disposition: A | Discharge status: Home / Self Care | Payer: Medicare Other | Source: Ambulatory Visit | Attending: Radiation Oncology | Admitting: Radiation Oncology

## 2018-01-28 ENCOUNTER — Encounter: Payer: Self-pay | Admitting: *Deleted

## 2018-01-28 ENCOUNTER — Encounter: Payer: Self-pay | Admitting: Oncology

## 2018-01-28 ENCOUNTER — Other Ambulatory Visit: Payer: Self-pay | Admitting: Anatomic Pathology & Clinical Pathology

## 2018-01-28 ENCOUNTER — Inpatient Hospital Stay: Payer: Medicare Other | Attending: Oncology | Admitting: Oncology

## 2018-01-28 ENCOUNTER — Other Ambulatory Visit (INDEPENDENT_AMBULATORY_CARE_PROVIDER_SITE_OTHER): Payer: Self-pay | Admitting: Vascular Surgery

## 2018-01-28 ENCOUNTER — Other Ambulatory Visit: Payer: Self-pay

## 2018-01-28 VITALS — BP 140/77 | HR 95 | Temp 96.7°F | Resp 18 | Wt 133.9 lb

## 2018-01-28 DIAGNOSIS — C7A090 Malignant carcinoid tumor of the bronchus and lung: Secondary | ICD-10-CM | POA: Diagnosis not present

## 2018-01-28 DIAGNOSIS — R51 Headache: Secondary | ICD-10-CM | POA: Diagnosis not present

## 2018-01-28 DIAGNOSIS — M899 Disorder of bone, unspecified: Secondary | ICD-10-CM | POA: Insufficient documentation

## 2018-01-28 DIAGNOSIS — C7A8 Other malignant neuroendocrine tumors: Secondary | ICD-10-CM

## 2018-01-28 DIAGNOSIS — Z7189 Other specified counseling: Secondary | ICD-10-CM

## 2018-01-28 DIAGNOSIS — Z87891 Personal history of nicotine dependence: Secondary | ICD-10-CM | POA: Insufficient documentation

## 2018-01-28 DIAGNOSIS — R634 Abnormal weight loss: Secondary | ICD-10-CM | POA: Diagnosis not present

## 2018-01-28 DIAGNOSIS — C3490 Malignant neoplasm of unspecified part of unspecified bronchus or lung: Secondary | ICD-10-CM | POA: Diagnosis not present

## 2018-01-28 DIAGNOSIS — R6881 Early satiety: Secondary | ICD-10-CM | POA: Insufficient documentation

## 2018-01-28 DIAGNOSIS — Z7982 Long term (current) use of aspirin: Secondary | ICD-10-CM | POA: Insufficient documentation

## 2018-01-28 DIAGNOSIS — G893 Neoplasm related pain (acute) (chronic): Secondary | ICD-10-CM

## 2018-01-28 DIAGNOSIS — C7B8 Other secondary neuroendocrine tumors: Secondary | ICD-10-CM

## 2018-01-28 DIAGNOSIS — C7951 Secondary malignant neoplasm of bone: Secondary | ICD-10-CM | POA: Diagnosis not present

## 2018-01-28 DIAGNOSIS — Z79899 Other long term (current) drug therapy: Secondary | ICD-10-CM | POA: Diagnosis not present

## 2018-01-28 DIAGNOSIS — C3412 Malignant neoplasm of upper lobe, left bronchus or lung: Secondary | ICD-10-CM

## 2018-01-28 HISTORY — DX: Other malignant neuroendocrine tumors: C7A.8

## 2018-01-28 LAB — SURGICAL PATHOLOGY

## 2018-01-28 MED ORDER — PREDNISONE 5 MG PO TABS: 5.0000 mg | ORAL_TABLET | Freq: Every day | ORAL | 0 refills | Status: DC

## 2018-01-28 MED ORDER — OXYCODONE ER 13.5 MG PO C12A: 13.5000 mg | EXTENDED_RELEASE_CAPSULE | Freq: Two times a day (BID) | ORAL | 0 refills | Status: DC

## 2018-01-28 MED ORDER — LIDOCAINE-PRILOCAINE 2.5-2.5 % EX CREA: TOPICAL_CREAM | CUTANEOUS | 3 refills | Status: AC

## 2018-01-28 MED ORDER — ONDANSETRON HCL 8 MG PO TABS: 8.0000 mg | ORAL_TABLET | Freq: Two times a day (BID) | ORAL | 1 refills | Status: AC | PRN

## 2018-01-28 MED ORDER — PROCHLORPERAZINE MALEATE 10 MG PO TABS: 10.0000 mg | ORAL_TABLET | Freq: Four times a day (QID) | ORAL | 1 refills | Status: AC | PRN

## 2018-01-28 NOTE — Consult Note (Signed)
NEW PATIENT EVALUATION  Name: Joseph Parker  MRN: [id number]  Date:   01/28/2018     DOB: August 04, 1940   This 77 y.o. 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   DIAGNOSIS: There were no encounter diagnoses.   PREVIOUS INVESTIGATIONS:  And MRI sc[place] well as PET CT scan reviewed Clinical notes reviewed Pathology report reviewed  HPI: patient is a 77 year old male who presented with swelling of his tongue and deviation of his tongue. Workup including MRI scan of the brain showed aggressive bony lesion of the right occipital condyle with hypoglossal canal involvement and right tongue denervation.PET CT scan showed a central left upper lobe mass with mediastinal invasion as well as multiple osseous metastatic lesions including spine and pelvis.right iliac lesion was biopsied and positive for metastatic non-small cell neuroendocrine carcinoma. Patient is on narcotic dependent pain mostly in his lower back and pelvic region. PET CT demonstrated significant disease in the pelvis. He is seen today for consideration of palliative radiation. Patient is ambulating without assistance. He has no focal neurologic deficits.  PLANNED TREATMENT REGIMEN: palliative radiation therapy  PAST MEDICAL HISTORY:  has a past medical history of Cancer ([place]), Carotid arterial disease ([place]), Carotid artery disease ([place]), Chickenpox, Colon polyps, COPD (chronic obstructive pulmonary disease) ([place]), Neuroendocrine cancer ([place]) (01/28/2018), Normal cardiac stress test, Shortness of breath dyspnea, Urinary frequency, and Urinary hesitancy.    PAST SURGICAL HISTORY:  Past Surgical History:  Procedure Laterality Date  . COLONOSCOPY    .  ESOPHAGOGASTRODUODENOSCOPY    . HERNIA REPAIR     right inquinal - as child  . LUMBAR LAMINECTOMY/DECOMPRESSION MICRODISCECTOMY Left 10/14/2014   Procedure: Left Lumbar three-four extraforaminla microdiskectomy;  Surgeon: Eustace Moore, MD;  Location: [facility];  Service: Neurosurgery;  Laterality: Left;  Left L34 extraforaminal microdiskectomy  . POLYPECTOMY    . TONSILLECTOMY     remote - childhood  . UPPER GASTROINTESTINAL ENDOSCOPY      FAMILY HISTORY: family history includes Alzheimer's disease in his father; Colon cancer in his sister; Diabetes in his father and son; Heart disease in his mother.  SOCIAL HISTORY:  reports that he quit smoking about 18 years ago. He smoked 0.50 packs per day. He has never used smokeless tobacco. He reports that he does not drink alcohol or use drugs.  ALLERGIES: Penicillins and Tessalon [benzonatate]  MEDICATIONS:  Current Outpatient Medications  Medication Sig Dispense Refill  . albuterol (PROVENTIL HFA;VENTOLIN HFA) 108 (90 Base) MCG/ACT inhaler Inhale 2 puffs into the lungs every 6 (six) hours as needed for wheezing or shortness of breath. 1 Inhaler 12  . aspirin EC 81 MG tablet Take 1 tablet (81 mg total) by mouth daily.    Marland Kitchen atorvastatin (LIPITOR) 10 MG tablet Take 1 tablet (10 mg total) by mouth daily. 90 tablet 3  . Fluticasone-Salmeterol (ADVAIR DISKUS) 250-50 MCG/DOSE AEPB Inhale 1 puff into the lungs 2 (two) times daily. 60 each prn  . HYDROcodone-acetaminophen (NORCO) 5-325 MG tablet Take 1 tablet by mouth every 6 (six) hours as needed for severe pain. 30 tablet 0  . lidocaine (LIDODERM) 5 % Place 1 patch onto the skin every 12 (twelve) hours. Remove & Discard patch within 12 hours or as directed by MD 10 patch 0  .  lidocaine-prilocaine (EMLA) cream Apply to affected area once 30 g 3  . ondansetron (ZOFRAN) 8 MG tablet Take 1 tablet (8 mg total) by mouth 2 (two) times daily as needed for refractory nausea / vomiting. Start on day 3 after  carboplatin chemo. 30 tablet 1  . oxyCODONE ER (XTAMPZA ER) 13.5 MG C12A Take 13.5 mg by mouth every 12 (twelve) hours. 30 each 0  . polyethylene glycol powder (GLYCOLAX/MIRALAX) powder Take 17 g by mouth 2 (two) times daily as needed.    . predniSONE (DELTASONE) 5 MG tablet Take 1 tablet (5 mg total) by mouth daily with breakfast. 30 tablet 0  . prochlorperazine (COMPAZINE) 10 MG tablet Take 1 tablet (10 mg total) by mouth every 6 (six) hours as needed (Nausea or vomiting). 30 tablet 1  . tiotropium (SPIRIVA) 18 MCG inhalation capsule Place 1 capsule (18 mcg total) into inhaler and inhale daily. (Patient taking differently: Place 18 mcg into inhaler and inhale daily as needed. ) 30 capsule 12   No current facility-administered medications for this encounter.     ECOG PERFORMANCE STATUS:  1 - Symptomatic but completely ambulatory  REVIEW OF SYSTEMS: except for the tongue swelling in bony pain Patient denies any weight loss, fatigue, weakness, fever, chills or night sweats. Patient denies any loss of vision, blurred vision. Patient denies any ringing  of the ears or hearing loss. No irregular heartbeat. Patient denies heart murmur or history of fainting. Patient denies any chest pain or pain radiating to her upper extremities. Patient denies any shortness of breath, difficulty breathing at night, cough or hemoptysis. Patient denies any swelling in the lower legs. Patient denies any nausea vomiting, vomiting of blood, or coffee ground material in the vomitus. Patient denies any stomach pain. Patient states has had normal bowel movements no significant constipation or diarrhea. Patient denies any dysuria, hematuria or significant nocturia. Patient denies any problems walking, swelling in the joints or loss of balance. Patient denies any skin changes, loss of hair or loss of weight. Patient denies any excessive worrying or anxiety or significant depression. Patient denies any problems with insomnia. Patient  denies excessive thirst, polyuria, polydipsia. Patient denies any swollen glands, patient denies easy bruising or easy bleeding. Patient denies any recent infections, allergies or URI. Patient "s visual fields have not changed significantly in recent time.    PHYSICAL EXAM: There were no vitals taken for this visit. Patient has deviation of his tongue. Motor sensory and DTR levels are equal and symmetric in the upper lower extremities. Range of motion of his lower extremities does not elicit pain.Well-developed well-nourished patient in NAD. HEENT reveals PERLA, EOMI, discs not visualized.  Oral cavity is clear. No oral mucosal lesions are identified. Neck is clear without evidence of cervical or supraclavicular adenopathy. Lungs are clear to A&P. Cardiac examination is essentially unremarkable with regular rate and rhythm without murmur rub or thrill. Abdomen is benign with no organomegaly or masses noted. Motor sensory and DTR levels are equal and symmetric in the upper and lower extremities. Cranial nerves II through XII are grossly intact. Proprioception is intact. No peripheral adenopathy or edema is identified. No motor or sensory levels are noted. Crude visual fields are within normal range.  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 Would plan on delivering 3000 cGy in 10 fractions. I will try to incorporate as much metastatic disease in this region concentrating on the SI joint lumbar spine region. He also significant involvement in the rightlower extremity. Also may consider palliative radiation therapy to his occipital condyle to prevent any further neurologic damage.risks and benefits of treatment including  possible diarrhea fatigue alteration of blood counts possible increased lower urinary tract symptoms and skin reaction all were discussed in detail. I have personally set up and ordered CT similar relation for tomorrow will start treatments as soon as possible. Patient family seem to comprehend my treatment plan well.  I would like to take this opportunity to thank you for allowing me to participate in the care of your patient.Noreene Filbert, MD

## 2018-01-28 NOTE — Progress Notes (Signed)
Hematology/Oncology Consult note [facility] Telephone:([phone] [phone]   Patient Care Team: Tonia Ghent, MD as PCP - General (Family Medicine) Eustace Moore, MD as Consulting Physician (Neurosurgery) Telford Nab, RN as Registered Nurse  REFERRING PROVIDER: Tonia Ghent, MD REASON FOR VISIT:  Follow-up for poorly differentiated neuroendocrine cancer  HISTORY OF PRESENTING ILLNESS:  Joseph Parker is a  77 y.o.  male with PMH listed below who was referred to me for evaluation of bone lytic lesion.   patient  presented to primary care physician for evaluation of right side tongue discomfort, loss of appetite for 2 weeks.  As well as 1 months of right groin pain feels like a pulled muscle.  Patient was treated with 10 days of prednisone as well as gabapentin 100 mg daily. Patient reports that prednisone has significantly helped patient's appetite however he developed thrush while on prednisone.  He was treated with nystatin solution with some improvement.  Persistent abnormal sensation. Patient was referred to ENT for further evaluation.  MRI was scanned for right tongue deviation  Also had CT abdomen pelvis with contrast on 12/28/2017 which showed abdominal lytic bone lesion in the right lesser trochanter, right sacrum, right ilium and T12 vertebral body most concerning for malignancy secondary to multiple myeloma versus metastasis disease. Pathologic avulsion fracture of the right lesser trochanter.   Patient reports chronic lower back pain, no recent injury.  No aggravating or alleviating factors.  Weight loss about 8 pounds for the past 4 weeks. accompanied by son, and wife  INTERVAL HISTORY Joseph Parker is a 77 y.o. male who has above history reviewed by me today presents for follow up visit for management of metastatic poorly differentiated neuroendocrine carcinoma During the interval, patient underwent right iliac bone biopsy.   Patient presents to discuss biopsy results.  #Neoplasm related pain, patient has used Norco 5/325 mg every 6 hours as needed.  Patient reports pain is better controlled however still feels 6 out of 10 after taking pain meds.  He sometimes have to use pain medication before he is due for another dose. He has bone pain all over, left shoulder, back, hip etc.  #Denies any shortness of breath, chest pain. #Appetite is poor.  Weight has been stable. P  Review of Systems  Constitutional: Positive for malaise/fatigue and weight loss. Negative for chills and fever.  HENT: Negative for nosebleeds and sore throat.   Eyes: Negative for double vision, photophobia and redness.  Respiratory: Negative for cough, shortness of breath and wheezing.   Cardiovascular: Negative for chest pain, palpitations and orthopnea.  Gastrointestinal: Negative for abdominal pain, blood in stool, nausea and vomiting.  Genitourinary: Negative for dysuria.  Musculoskeletal: Positive for back pain and joint pain. Negative for myalgias and neck pain.  Skin: Negative for itching and rash.  Neurological: Negative for dizziness, tingling and tremors.       Decreased food taste.    Endo/Heme/Allergies: Negative for environmental allergies. Does not bruise/bleed easily.  Psychiatric/Behavioral: Negative for depression.    MEDICAL HISTORY:  Past Medical History:  Diagnosis Date  . Cancer ([place])   . Carotid arterial disease ([place])   . Carotid artery disease ([place])   . Chickenpox   . Colon polyps   . COPD (chronic obstructive pulmonary disease) ([place])   . Neuroendocrine cancer ([place]) 01/28/2018  . Normal cardiac stress test    06/2013  . Shortness of breath dyspnea   . Urinary frequency   .  Urinary hesitancy     SURGICAL HISTORY: Past Surgical History:  Procedure Laterality Date  . COLONOSCOPY    . ESOPHAGOGASTRODUODENOSCOPY    . HERNIA REPAIR     right inquinal - as child  . LUMBAR LAMINECTOMY/DECOMPRESSION  MICRODISCECTOMY Left 10/14/2014   Procedure: Left Lumbar three-four extraforaminla microdiskectomy;  Surgeon: Eustace Moore, MD;  Location: [facility];  Service: Neurosurgery;  Laterality: Left;  Left L34 extraforaminal microdiskectomy  . POLYPECTOMY    . TONSILLECTOMY     remote - childhood  . UPPER GASTROINTESTINAL ENDOSCOPY      SOCIAL HISTORY: Social History   Socioeconomic History  . Marital status: Married    Spouse name: Not on file  . Number of children: 5  . Years of education: 87  . Highest education level: Not on file  Occupational History  . Occupation: mfg Librarian, academic    Comment: retired  Scientific laboratory technician  . Financial resource strain: Not on file  . Food insecurity:    Worry: Not on file    Inability: Not on file  . Transportation needs:    Medical: Not on file    Non-medical: Not on file  Tobacco Use  . Smoking status: Former Smoker    Packs/day: 0.50    Last attempt to quit: 07/20/1999    Years since quitting: 18.5  . Smokeless tobacco: Never Used  Substance and Sexual Activity  . Alcohol use: No    Alcohol/week: 0.0 standard drinks  . Drug use: No  . Sexual activity: Yes    Birth control/protection: Post-menopausal  Lifestyle  . Physical activity:    Days per week: Not on file    Minutes per session: Not on file  . Stress: Not on file  Relationships  . Social connections:    Talks on phone: Not on file    Gets together: Not on file    Attends religious service: Not on file    Active member of club or organization: Not on file    Attends meetings of clubs or organizations: Not on file    Relationship status: Not on file  . Intimate partner violence:    Fear of current or ex partner: Not on file    Emotionally abused: Not on file    Physically abused: Not on file    Forced sexual activity: Not on file  Other Topics Concern  . Not on file  Social History Narrative   HSG. Married '63.    Previous marriage - 3 years/divorce. 5 sons. 7 grandchildren.    Work - Financial planner - retired.    Active in community and in his church   [employer] fan    FAMILY HISTORY: Family History  Problem Relation Age of Onset  . Heart disease Mother        CABG; Valve replacement - died post-op  . Alzheimer's disease Father   . Diabetes Father   . Diabetes Son   . Colon cancer Sister   . Prostate cancer Neg Hx   . Colon polyps Neg Hx     ALLERGIES:  is allergic to penicillins and tessalon [benzonatate].  MEDICATIONS:  Current Outpatient Medications  Medication Sig Dispense Refill  . albuterol (PROVENTIL HFA;VENTOLIN HFA) 108 (90 Base) MCG/ACT inhaler Inhale 2 puffs into the lungs every 6 (six) hours as needed for wheezing or shortness of breath. 1 Inhaler 12  . aspirin EC 81 MG tablet Take 1 tablet (81 mg total) by mouth daily.    Marland Kitchen  atorvastatin (LIPITOR) 10 MG tablet Take 1 tablet (10 mg total) by mouth daily. 90 tablet 3  . Fluticasone-Salmeterol (ADVAIR DISKUS) 250-50 MCG/DOSE AEPB Inhale 1 puff into the lungs 2 (two) times daily. 60 each prn  . HYDROcodone-acetaminophen (NORCO) 5-325 MG tablet Take 1 tablet by mouth every 6 (six) hours as needed for severe pain. 30 tablet 0  . lidocaine (LIDODERM) 5 % Place 1 patch onto the skin every 12 (twelve) hours. Remove & Discard patch within 12 hours or as directed by MD 10 patch 0  . polyethylene glycol powder (GLYCOLAX/MIRALAX) powder Take 17 g by mouth 2 (two) times daily as needed.    . tiotropium (SPIRIVA) 18 MCG inhalation capsule Place 1 capsule (18 mcg total) into inhaler and inhale daily. (Patient taking differently: Place 18 mcg into inhaler and inhale daily as needed. ) 30 capsule 12  . lidocaine-prilocaine (EMLA) cream Apply to affected area once 30 g 3  . ondansetron (ZOFRAN) 8 MG tablet Take 1 tablet (8 mg total) by mouth 2 (two) times daily as needed for refractory nausea / vomiting. Start on day 3 after carboplatin chemo. 30 tablet 1  . oxyCODONE ER (XTAMPZA ER) 13.5 MG C12A Take 13.5 mg by  mouth every 12 (twelve) hours. 30 each 0  . predniSONE (DELTASONE) 5 MG tablet Take 1 tablet (5 mg total) by mouth daily with breakfast. 30 tablet 0  . prochlorperazine (COMPAZINE) 10 MG tablet Take 1 tablet (10 mg total) by mouth every 6 (six) hours as needed (Nausea or vomiting). 30 tablet 1   No current facility-administered medications for this visit.      PHYSICAL EXAMINATION: ECOG PERFORMANCE STATUS: 1 - Symptomatic but completely ambulatory Vitals:   01/28/18 1320  BP: 140/77  Pulse: 95  Resp: 18  Temp: (!) 96.7 F (35.9 C)   Filed Weights   01/28/18 1320  Weight: 133 lb 14.4 oz (60.7 kg)    Physical Exam  Constitutional: He is oriented to person, place, and time. No distress.  HENT:  Head: Normocephalic and atraumatic.  Mouth/Throat: Oropharynx is clear and moist.  + thrush  Eyes: Pupils are equal, round, and reactive to light. EOM are normal. No scleral icterus.  Neck: Normal range of motion. Neck supple.  Cardiovascular: Normal rate, regular rhythm and normal heart sounds.  Pulmonary/Chest: Effort normal. No respiratory distress. He has no wheezes.  Decreased breath sounds bilaterally  Abdominal: Soft. Bowel sounds are normal. He exhibits no distension and no mass. There is no tenderness.  Musculoskeletal: Normal range of motion. He exhibits no edema or deformity.  Neurological: He is alert and oriented to person, place, and time. No cranial nerve deficit. Coordination normal.  Skin: Skin is warm and dry. No rash noted. No erythema.  Psychiatric: He has a normal mood and affect. His behavior is normal. Thought content normal.     LABORATORY DATA:  I have reviewed the data as listed Lab Results  Component Value Date   WBC 11.0 (H) 01/20/2018   HGB 12.9 (L) 01/20/2018   HCT 37.8 (L) 01/20/2018   MCV 85.7 01/20/2018   PLT 245 01/20/2018   Recent Labs    04/19/17 1151 12/19/17 1649 12/31/17 1531 01/02/18 1606 01/20/18 1252  NA 138 139  --   --  136  K  4.2 4.0  --   --  4.3  CL 103 101  --   --  97*  CO2 29 30  --   --  27  GLUCOSE 94 87  --   --  107*  BUN 11 11  --   --  13  CREATININE 0.85 0.92  --   --  0.79  CALCIUM 9.7 10.0  --   --  10.3  GFRNONAA  --   --   --   --  >60  GFRAA  --   --   --   --  >60  PROT 7.6 7.5 7.1 7.7 7.8  ALBUMIN 4.1 4.5  --  4.7 4.3  AST 13 17  --  21 29  ALT 10 8  --  10 12  ALKPHOS 67 86  --  90 92  BILITOT 0.4 0.9  --  0.8 1.2  BILIDIR  --   --   --  0.1  --   IBILI  --   --   --  0.7  --    Iron/TIBC/Ferritin/ %Sat No results found for: IRON, TIBC, FERRITIN, IRONPCTSAT   05/01/2017 Cologuard negative.   RADIOGRAPHIC STUDIES: I have personally reviewed the radiological images as listed and agreed with the findings in the report. 12/28/2017 CT abdomen pelvis w contrast 1. Abnormal lytic bone lesions in the right lesser trochanter, right sacrum, right ilium and T12 vertebral body most concerning for malignancy secondary to multiple myeloma versus metastatic disease. 2. Pathologic avulsion fracture of the right lesser trochanter. 3. Bilateral renal cysts. 4.  Aortic Atherosclerosis (ICD10-I70.0).  ASSESSMENT & PLAN:  1. Neuroendocrine cancer ([place])   2. Goals of care, counseling/discussion   3. Bone lesion   4. Weight loss   5. Early satiety    #I independently reviewed patient's PET scan, MRI and pathology reports.  Discussed with patient, wife and son. The diagnosis of metastatic poorly differentiated neuroendocrine carcinoma and care plan were discussed with patient in detail.  NCCN guidelines were reviewed and shared with patient.  The goal of treatment which is to palliate disease, disease related symptoms, improve quality of life and hopefully prolong life was highlighted in our discussion.  Chemotherapy education was provided.  We had discussed the composition of chemotherapy regimen carboplatin/ Etoposide/TEcentriq length of chemo cycle, duration of treatment and the time to assess response  to treatment.  Supportive care measures are necessary for patient well-being and will be provided as necessary.  I explained to the patient the risks and benefits of chemotherapy including all but not limited to hair loss, mouth sore, nausea, vomiting, low blood counts, bleeding, and risk of life threatening infection and even death, secondary malignancy etc.   I discussed the mechanism of action and rationale of using immunotherapy.  The goal of therapy is palliative; and length of treatments are likely ongoing/based upon the results of the scans. Discussed the potential side effects of immunotherapy including but not limited to diarrhea; skin rash; respiratory failure, neurotoxicity, elevated LFTs/endocrine abnormalities etc.  Patient and family voice understanding and patient willing to proceed chemotherapy.   # Chemotherapy education; refer to surgeon for port placement. Hopefully the planned start chemotherapy next week. Antiemetics-Zofran and Compazine; EMLA cream sent to pharmacy  # Refer to RadOnc to establish care for palliative RT.  # start patient on 21m prednisone for both bone pain and appetite stimulator.  # Neoplasm induced pain: add Xtampza 13.5 mg BID, continue Norco 5/3271mQ4-6 hours as needed.  We spent sufficient time to discuss many aspect of care, questions were answered to patient's satisfaction.   Orders Placed This Encounter  Procedures  . CBC with  Differential    Standing Status:   Standing    Number of Occurrences:   20    Standing Expiration Date:   01/29/2019  . Comprehensive metabolic panel    Standing Status:   Standing    Number of Occurrences:   20    Standing Expiration Date:   01/29/2019  . TSH    Standing Status:   Standing    Number of Occurrences:   20    Standing Expiration Date:   01/29/2019  . Ambulatory referral to Vascular Surgery    Referral Priority:   Routine    Referral Type:   Surgical    Referral Reason:   Specialty Services Required     Referred to Provider:   Algernon Huxley, MD    Requested Specialty:   Vascular Surgery    Number of Visits Requested:   1  . PHYSICIAN COMMUNICATION ORDER    TSH baseline and every 3rd cycle.  We spent sufficient time to discuss many aspect of care, questions were answered to patient's satisfaction.  The patient knows to call the clinic with any problems questions or concerns.  Return of visit: 02/05/2018  Total face to face encounter time for this patient visit was 45 min. >50% of the time was  spent in counseling and coordination of care.   Earlie Server, MD, PhD Hematology Oncology [facility] Pager- [phone] 01/28/2018

## 2018-01-28 NOTE — Progress Notes (Signed)
START OFF PATHWAY REGIMEN - [Other Dx]   [id number]:Atezolizumab D1 + Carboplatin D1 + Etoposide  IV D1-3 q21 Days x 4 Cycles Followed by Huey Bienenstock D1 q21 Days:   Cycles 1 through 4, every 21 days:     Atezolizumab      Carboplatin      Etoposide    Cycles 5 and beyond, every 21 days:     Atezolizumab   **Always confirm dose/schedule in your pharmacy ordering system**  Patient Characteristics: Intent of Therapy: Non-Curative / Palliative Intent, Discussed with Patient

## 2018-01-28 NOTE — Progress Notes (Signed)
  Oncology Nurse Navigator Documentation  Navigator Location: [facility] (01/28/18 1500) Referral date to RadOnc/MedOnc: 12/28/17 (01/28/18 1500) )Navigator Encounter Type: Follow-up Appt;Diagnostic Results (01/28/18 1500)   Abnormal Finding Date: 12/28/17 (01/28/18 1500) Confirmed Diagnosis Date: 01/28/18 (01/28/18 1500)               Patient Visit Type: MedOnc (01/28/18 1500) Treatment Phase: Pre-Tx/Tx Discussion (01/28/18 1500) Barriers/Navigation Needs: Education;Coordination of Care (01/28/18 1500) Education: Understanding Cancer/ Treatment Options;Newly Diagnosed Cancer Education (01/28/18 1500) Interventions: Coordination of Care;Education (01/28/18 1500)   Coordination of Care: Appts;Chemo (01/28/18 1500) Education Method: Verbal;Written (01/28/18 1500)      Acuity: Level 2 (01/28/18 1500)   Acuity Level 2: Initial guidance, education and coordination as needed;Assistance expediting appointments;Educational needs (01/28/18 1500)  met with patient and his family during follow up visit with Dr. Tasia Catchings to discuss recent biopsy and treatment options. All questions answered during visit. Pt given resources regarding diagnosis and supportive services available. Informed pt that will review appts at CT simulation appt tomorrow. Contact info given and instructed to call with any further questions or needs. Pt and family verbalized understanding.    Time Spent with Patient: 60 (01/28/18 1500)

## 2018-01-28 NOTE — Progress Notes (Signed)
Patient here for follow up. Pt complains of pain "to different places, different days."  Worse pain to left shoulder and waistline.

## 2018-01-29 ENCOUNTER — Telehealth (INDEPENDENT_AMBULATORY_CARE_PROVIDER_SITE_OTHER): Payer: Self-pay

## 2018-01-29 ENCOUNTER — Encounter (INDEPENDENT_AMBULATORY_CARE_PROVIDER_SITE_OTHER): Payer: Self-pay

## 2018-01-29 ENCOUNTER — Ambulatory Visit
Admission: RE | Admit: 2018-01-29 | Discharge: 2018-01-29 | Disposition: A | Discharge status: Home / Self Care | Payer: Medicare Other | Source: Ambulatory Visit | Attending: Radiation Oncology | Admitting: Radiation Oncology

## 2018-01-29 DIAGNOSIS — Z510 Encounter for antineoplastic radiation therapy: Secondary | ICD-10-CM | POA: Diagnosis not present

## 2018-01-29 DIAGNOSIS — R51 Headache: Secondary | ICD-10-CM | POA: Insufficient documentation

## 2018-01-29 DIAGNOSIS — C3490 Malignant neoplasm of unspecified part of unspecified bronchus or lung: Secondary | ICD-10-CM | POA: Insufficient documentation

## 2018-01-29 DIAGNOSIS — C7951 Secondary malignant neoplasm of bone: Secondary | ICD-10-CM | POA: Diagnosis not present

## 2018-01-29 DIAGNOSIS — Z87891 Personal history of nicotine dependence: Secondary | ICD-10-CM | POA: Insufficient documentation

## 2018-01-29 NOTE — Telephone Encounter (Signed)
Called and spoke with the patient's wife, I gave her all of the instructions regarding the patient's upcoming procedure to have a port placed.Joseph Parker

## 2018-01-30 DIAGNOSIS — Z510 Encounter for antineoplastic radiation therapy: Secondary | ICD-10-CM | POA: Diagnosis not present

## 2018-01-30 DIAGNOSIS — R51 Headache: Secondary | ICD-10-CM | POA: Diagnosis not present

## 2018-01-30 DIAGNOSIS — Z87891 Personal history of nicotine dependence: Secondary | ICD-10-CM | POA: Diagnosis not present

## 2018-01-30 DIAGNOSIS — C7951 Secondary malignant neoplasm of bone: Secondary | ICD-10-CM | POA: Diagnosis not present

## 2018-01-30 DIAGNOSIS — C3490 Malignant neoplasm of unspecified part of unspecified bronchus or lung: Secondary | ICD-10-CM | POA: Diagnosis not present

## 2018-01-31 ENCOUNTER — Ambulatory Visit
Admission: RE | Admit: 2018-01-31 | Discharge: 2018-01-31 | Disposition: A | Discharge status: Home / Self Care | Payer: Medicare Other | Attending: Vascular Surgery | Admitting: Vascular Surgery

## 2018-01-31 ENCOUNTER — Encounter: Payer: Self-pay | Admitting: *Deleted

## 2018-01-31 ENCOUNTER — Other Ambulatory Visit: Payer: Self-pay

## 2018-01-31 ENCOUNTER — Encounter
Admission: RE | Disposition: A | Discharge status: Home / Self Care | Payer: Self-pay | Source: Home / Self Care | Attending: Vascular Surgery

## 2018-01-31 ENCOUNTER — Other Ambulatory Visit: Payer: Medicare Other

## 2018-01-31 DIAGNOSIS — C3490 Malignant neoplasm of unspecified part of unspecified bronchus or lung: Secondary | ICD-10-CM

## 2018-01-31 HISTORY — PX: PORTA CATH INSERTION: CATH118285

## 2018-01-31 HISTORY — DX: Hyperlipidemia, unspecified: E78.5

## 2018-01-31 SURGERY — PORTA CATH INSERTION
Anesthesia: Moderate Sedation

## 2018-01-31 MED ORDER — MIDAZOLAM HCL 2 MG/2ML IJ SOLN
INTRAMUSCULAR | Status: DC | PRN
  Administered 2018-01-31: 2 mg via INTRAVENOUS
  Administered 2018-01-31: 1 mg via INTRAVENOUS

## 2018-01-31 MED ORDER — MIDAZOLAM HCL 5 MG/5ML IJ SOLN
INTRAMUSCULAR | Status: AC
  Filled 2018-01-31: qty 5

## 2018-01-31 MED ORDER — SODIUM CHLORIDE 0.9 % IV SOLN
Freq: Once | INTRAVENOUS | Status: DC
  Filled 2018-01-31: qty 2

## 2018-01-31 MED ORDER — LIDOCAINE-EPINEPHRINE (PF) 1 %-1:200000 IJ SOLN
INTRAMUSCULAR | Status: AC
  Filled 2018-01-31: qty 20

## 2018-01-31 MED ORDER — HEPARIN (PORCINE) IN NACL 1000-0.9 UT/500ML-% IV SOLN
INTRAVENOUS | Status: AC
  Filled 2018-01-31: qty 500

## 2018-01-31 MED ORDER — SODIUM CHLORIDE 0.9 % IV SOLN
INTRAVENOUS | Status: DC
  Administered 2018-01-31: 09:00:00 via INTRAVENOUS

## 2018-01-31 MED ORDER — CLINDAMYCIN PHOSPHATE 300 MG/50ML IV SOLN
300.0000 mg | Freq: Once | INTRAVENOUS | Status: AC
  Administered 2018-01-31: 300 mg via INTRAVENOUS

## 2018-01-31 MED ORDER — ONDANSETRON HCL 4 MG/2ML IJ SOLN: 4.0000 mg | Freq: Four times a day (QID) | INTRAMUSCULAR | Status: DC | PRN

## 2018-01-31 MED ORDER — METHYLPREDNISOLONE SODIUM SUCC 125 MG IJ SOLR: 125.0000 mg | INTRAMUSCULAR | Status: DC | PRN

## 2018-01-31 MED ORDER — FENTANYL CITRATE (PF) 100 MCG/2ML IJ SOLN
INTRAMUSCULAR | Status: AC
  Filled 2018-01-31: qty 2

## 2018-01-31 MED ORDER — CLINDAMYCIN PHOSPHATE 300 MG/50ML IV SOLN
INTRAVENOUS | Status: AC
  Filled 2018-01-31: qty 50

## 2018-01-31 MED ORDER — FENTANYL CITRATE (PF) 100 MCG/2ML IJ SOLN
INTRAMUSCULAR | Status: DC | PRN
  Administered 2018-01-31: 25 ug via INTRAVENOUS
  Administered 2018-01-31: 50 ug via INTRAVENOUS

## 2018-01-31 MED ORDER — HYDROMORPHONE HCL 1 MG/ML IJ SOLN: 1.0000 mg | Freq: Once | INTRAMUSCULAR | Status: DC | PRN

## 2018-01-31 MED ORDER — FAMOTIDINE 20 MG PO TABS: 40.0000 mg | ORAL_TABLET | ORAL | Status: DC | PRN

## 2018-01-31 SURGICAL SUPPLY — 9 items
CANNULA 5F STIFF (CANNULA) ×2 IMPLANT
KIT PORT POWER 8FR ISP CVUE (Port) ×2 IMPLANT
PACK ANGIOGRAPHY (CUSTOM PROCEDURE TRAY) ×3 IMPLANT
PAD GROUND ADULT SPLIT (MISCELLANEOUS) ×2 IMPLANT
PENCIL ELECTRO HAND CTR (MISCELLANEOUS) ×3 IMPLANT
SUT MNCRL AB 4-0 PS2 18 (SUTURE) ×3 IMPLANT
SUT PROLENE 0 CT 1 30 (SUTURE) ×3 IMPLANT
SUT VIC AB 3-0 SH 27 (SUTURE) ×3
SUT VIC AB 3-0 SH 27X BRD (SUTURE) IMPLANT

## 2018-01-31 NOTE — Op Note (Signed)
      [facility]       Operative Note  Date: 01/31/2018  Preoperative diagnosis:  1. Lung cancer  Postoperative diagnosis:  Same as above  Procedures: #1. Ultrasound guidance for vascular access to the right internal jugular vein. #2. Fluoroscopic guidance for placement of catheter. #3. Placement of CT compatible Port-A-Cath, right internal jugular vein.  Surgeon: Leotis Pain, MD.   Anesthesia: Local with moderate conscious sedation for approximately 20  minutes using 3 mg of Versed and 75 mcg of Fentanyl  Fluoroscopy time: less than 1 minute  Contrast used: 0  Estimated blood loss: 5 cc  Indication for the procedure:  The patient is a 77 y.o.male with lung cancer.  The patient needs a Port-A-Cath for durable venous access, chemotherapy, lab draws, and CT scans. We are asked to place this. Risks and benefits were discussed and informed consent was obtained.  Description of procedure: The patient was brought to the vascular and interventional radiology suite.  Moderate conscious sedation was administered throughout the procedure during a face to face encounter with the patient with my supervision of the RN administering medicines and monitoring the patient's vital signs, pulse oximetry, telemetry and mental status throughout from the start of the procedure until the patient was taken to the recovery room. The right neck chest and shoulder were sterilely prepped and draped, and a sterile surgical field was created. Ultrasound was used to help visualize a patent right internal jugular vein. This was then accessed under direct ultrasound guidance without difficulty with the Seldinger needle and a permanent image was recorded. A J-wire was placed. After skin nick and dilatation, the peel-away sheath was then placed over the wire. I then anesthetized an area under the clavicle approximately 1-2 fingerbreadths. A transverse incision was created and an inferior pocket was  created with electrocautery and blunt dissection. The port was then brought onto the field, placed into the pocket and secured to the chest wall with 2 Prolene sutures. The catheter was connected to the port and tunneled from the subclavicular incision to the access site. Fluoroscopic guidance was then used to cut the catheter to an appropriate length. The catheter was then placed through the peel-away sheath and the peel-away sheath was removed. The catheter tip was parked in excellent location under fluorocoscopic guidance in the mid SVC. The pocket was then irrigated with antibiotic impregnated saline and the wound was closed with a running 3-0 Vicryl and a 4-0 Monocryl. The access incision was closed with a single 4-0 Monocryl. The Huber needle was used to withdraw blood and flush the port with heparinized saline. Dermabond was then placed as a dressing. The patient tolerated the procedure well and was taken to the recovery room in stable condition.   Leotis Pain 01/31/2018 9:47 AM   This note was created with Dragon Medical transcription system. Any errors in dictation are purely unintentional.

## 2018-01-31 NOTE — Progress Notes (Addendum)
Tumor Board Documentation  Joseph Parker was presented by Dr Tasia Catchings at our Tumor Board on 01/31/2018, which included representatives from medical oncology, radiation oncology, surgical oncology, surgical, navigation, pathology, radiology, internal medicine, research, pulmonology.  Joseph Parker currently presents as a new patient with history of the following treatments: CT biopsy.  Additionally, we reviewed previous medical and familial history, history of present illness, and recent lab results along with all available histopathologic and imaging studies. The tumor board considered available treatment options and made the following recommendations: Systemic chemotherapy with palliative RT  The following procedures/referrals were also placed: No orders of the defined types were placed in this encounter.   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  Tumor board is a meeting of clinicians from various specialty areas who evaluate and discuss patients for whom a multidisciplinary approach is being considered. Final determinations in the plan of care are those of the provider(s). The responsibility for follow up of recommendations given during tumor board is that of the provider.   Today's extended care, comprehensive team conference, Joseph Parker was not present for the discussion and was not examined.   Multidisciplinary Tumor Board is a multidisciplinary case peer review process.  Decisions discussed in the Multidisciplinary Tumor Board reflect the opinions of the specialists present at the conference without having examined the patient.  Ultimately, treatment and diagnostic decisions rest with the primary provider(s) and the patient.

## 2018-01-31 NOTE — Patient Instructions (Signed)
Atezolizumab injection What is this medicine? ATEZOLIZUMAB (a te zoe LIZ ue mab) is a monoclonal antibody. It is used to treat bladder cancer (urothelial cancer) and non-small cell lung cancer. This medicine may be used for other purposes; ask your health care provider or pharmacist if you have questions. COMMON BRAND NAME(S): Tecentriq What should I tell my health care provider before I take this medicine? They need to know if you have any of these conditions: -diabetes -immune system problems -infection -inflammatory bowel disease -liver disease -lung or breathing disease -lupus -nervous system problems like myasthenia gravis or Guillain-Barre syndrome -organ transplant -an unusual or allergic reaction to atezolizumab, other medicines, foods, dyes, or preservatives -pregnant or trying to get pregnant -breast-feeding How should I use this medicine? This medicine is for infusion into a vein. It is given by a health care professional in a hospital or clinic setting. A special MedGuide will be given to you before each treatment. Be sure to read this information carefully each time. Talk to your pediatrician regarding the use of this medicine in children. Special care may be needed. Overdosage: If you think you have taken too much of this medicine contact a poison control center or emergency room at once. NOTE: This medicine is only for you. Do not share this medicine with others. What if I miss a dose? It is important not to miss your dose. Call your doctor or health care professional if you are unable to keep an appointment. What may interact with this medicine? Interactions have not been studied. This list may not describe all possible interactions. Give your health care provider a list of all the medicines, herbs, non-prescription drugs, or dietary supplements you use. Also tell them if you smoke, drink alcohol, or use illegal drugs. Some items may interact with your medicine. What  should I watch for while using this medicine? Your condition will be monitored carefully while you are receiving this medicine. You may need blood work done while you are taking this medicine. Do not become pregnant while taking this medicine or for at least 5 months after stopping it. Women should inform their doctor if they wish to become pregnant or think they might be pregnant. There is a potential for serious side effects to an unborn child. Talk to your health care professional or pharmacist for more information. Do not breast-feed an infant while taking this medicine or for at least 5 months after the last dose. What side effects may I notice from receiving this medicine? Side effects that you should report to your doctor or health care professional as soon as possible: -allergic reactions like skin rash, itching or hives, swelling of the face, lips, or tongue -black, tarry stools -bloody or watery diarrhea -breathing problems -changes in vision -chest pain or chest tightness -chills -facial flushing -fever -headache -signs and symptoms of high blood sugar such as dizziness; dry mouth; dry skin; fruity breath; nausea; stomach pain; increased hunger or thirst; increased urination -signs and symptoms of liver injury like dark yellow or brown urine; general ill feeling or flu-like symptoms; light-colored stools; loss of appetite; nausea; right upper belly pain; unusually weak or tired; yellowing of the eyes or skin -stomach pain -trouble passing urine or change in the amount of urine Side effects that usually do not require medical attention (report to your doctor or health care professional if they continue or are bothersome): -cough -diarrhea -joint pain -muscle pain -muscle weakness -tiredness -weight loss This list may not describe all   possible side effects. Call your doctor for medical advice about side effects. You may report side effects to FDA at 1-800-FDA-1088. Where should  I keep my medicine? This drug is given in a hospital or clinic and will not be stored at home. NOTE: This sheet is a summary. It may not cover all possible information. If you have questions about this medicine, talk to your doctor, pharmacist, or health care provider.  2018 Elsevier/Gold Standard (2015-03-10 17:54:14) Etoposide, VP-16 injection What is this medicine? ETOPOSIDE, VP-16 (e toe POE side) is a chemotherapy drug. It is used to treat testicular cancer, lung cancer, and other cancers. This medicine may be used for other purposes; ask your health care provider or pharmacist if you have questions. COMMON BRAND NAME(S): Etopophos, Toposar, VePesid What should I tell my health care provider before I take this medicine? They need to know if you have any of these conditions: -infection -kidney disease -liver disease -low blood counts, like low white cell, platelet, or red cell counts -an unusual or allergic reaction to etoposide, other medicines, foods, dyes, or preservatives -pregnant or trying to get pregnant -breast-feeding How should I use this medicine? This medicine is for infusion into a vein. It is administered in a hospital or clinic by a specially trained health care professional. Talk to your pediatrician regarding the use of this medicine in children. Special care may be needed. Overdosage: If you think you have taken too much of this medicine contact a poison control center or emergency room at once. NOTE: This medicine is only for you. Do not share this medicine with others. What if I miss a dose? It is important not to miss your dose. Call your doctor or health care professional if you are unable to keep an appointment. What may interact with this medicine? -aspirin -certain medications for seizures like carbamazepine, phenobarbital, phenytoin, valproic acid -cyclosporine -levamisole -warfarin This list may not describe all possible interactions. Give your health  care provider a list of all the medicines, herbs, non-prescription drugs, or dietary supplements you use. Also tell them if you smoke, drink alcohol, or use illegal drugs. Some items may interact with your medicine. What should I watch for while using this medicine? Visit your doctor for checks on your progress. This drug may make you feel generally unwell. This is not uncommon, as chemotherapy can affect healthy cells as well as cancer cells. Report any side effects. Continue your course of treatment even though you feel ill unless your doctor tells you to stop. In some cases, you may be given additional medicines to help with side effects. Follow all directions for their use. Call your doctor or health care professional for advice if you get a fever, chills or sore throat, or other symptoms of a cold or flu. Do not treat yourself. This drug decreases your body's ability to fight infections. Try to avoid being around people who are sick. This medicine may increase your risk to bruise or bleed. Call your doctor or health care professional if you notice any unusual bleeding. Talk to your doctor about your risk of cancer. You may be more at risk for certain types of cancers if you take this medicine. Do not become pregnant while taking this medicine or for at least 6 months after stopping it. Women should inform their doctor if they wish to become pregnant or think they might be pregnant. Women of child-bearing potential will need to have a negative pregnancy test before starting this medicine. There   is a potential for serious side effects to an unborn child. Talk to your health care professional or pharmacist for more information. Do not breast-feed an infant while taking this medicine. Men must use a latex condom during sexual contact with a woman while taking this medicine and for at least 4 months after stopping it. A latex condom is needed even if you have had a vasectomy. Contact your doctor right away  if your partner becomes pregnant. Do not donate sperm while taking this medicine and for at least 4 months after you stop taking this medicine. Men should inform their doctors if they wish to father a child. This medicine may lower sperm counts. What side effects may I notice from receiving this medicine? Side effects that you should report to your doctor or health care professional as soon as possible: -allergic reactions like skin rash, itching or hives, swelling of the face, lips, or tongue -low blood counts - this medicine may decrease the number of white blood cells, red blood cells and platelets. You may be at increased risk for infections and bleeding. -signs of infection - fever or chills, cough, sore throat, pain or difficulty passing urine -signs of decreased platelets or bleeding - bruising, pinpoint red spots on the skin, black, tarry stools, blood in the urine -signs of decreased red blood cells - unusually weak or tired, fainting spells, lightheadedness -breathing problems -changes in vision -mouth or throat sores or ulcers -pain, redness, swelling or irritation at the injection site -pain, tingling, numbness in the hands or feet -redness, blistering, peeling or loosening of the skin, including inside the mouth -seizures -vomiting Side effects that usually do not require medical attention (report to your doctor or health care professional if they continue or are bothersome): -diarrhea -hair loss -loss of appetite -nausea -stomach pain This list may not describe all possible side effects. Call your doctor for medical advice about side effects. You may report side effects to FDA at 1-800-FDA-1088. Where should I keep my medicine? This drug is given in a hospital or clinic and will not be stored at home. NOTE: This sheet is a summary. It may not cover all possible information. If you have questions about this medicine, talk to your doctor, pharmacist, or health care provider.   2018 Elsevier/Gold Standard (2015-01-29 11:53:23) Carboplatin injection What is this medicine? CARBOPLATIN (KAR boe pla tin) is a chemotherapy drug. It targets fast dividing cells, like cancer cells, and causes these cells to die. This medicine is used to treat ovarian cancer and many other cancers. This medicine may be used for other purposes; ask your health care provider or pharmacist if you have questions. COMMON BRAND NAME(S): Paraplatin What should I tell my health care provider before I take this medicine? They need to know if you have any of these conditions: -blood disorders -hearing problems -kidney disease -recent or ongoing radiation therapy -an unusual or allergic reaction to carboplatin, cisplatin, other chemotherapy, other medicines, foods, dyes, or preservatives -pregnant or trying to get pregnant -breast-feeding How should I use this medicine? This drug is usually given as an infusion into a vein. It is administered in a hospital or clinic by a specially trained health care professional. Talk to your pediatrician regarding the use of this medicine in children. Special care may be needed. Overdosage: If you think you have taken too much of this medicine contact a poison control center or emergency room at once. NOTE: This medicine is only for you. Do not share   this medicine with others. What if I miss a dose? It is important not to miss a dose. Call your doctor or health care professional if you are unable to keep an appointment. What may interact with this medicine? -medicines for seizures -medicines to increase blood counts like filgrastim, pegfilgrastim, sargramostim -some antibiotics like amikacin, gentamicin, neomycin, streptomycin, tobramycin -vaccines Talk to your doctor or health care professional before taking any of these medicines: -acetaminophen -aspirin -ibuprofen -ketoprofen -naproxen This list may not describe all possible interactions. Give your health  care provider a list of all the medicines, herbs, non-prescription drugs, or dietary supplements you use. Also tell them if you smoke, drink alcohol, or use illegal drugs. Some items may interact with your medicine. What should I watch for while using this medicine? Your condition will be monitored carefully while you are receiving this medicine. You will need important blood work done while you are taking this medicine. This drug may make you feel generally unwell. This is not uncommon, as chemotherapy can affect healthy cells as well as cancer cells. Report any side effects. Continue your course of treatment even though you feel ill unless your doctor tells you to stop. In some cases, you may be given additional medicines to help with side effects. Follow all directions for their use. Call your doctor or health care professional for advice if you get a fever, chills or sore throat, or other symptoms of a cold or flu. Do not treat yourself. This drug decreases your body's ability to fight infections. Try to avoid being around people who are sick. This medicine may increase your risk to bruise or bleed. Call your doctor or health care professional if you notice any unusual bleeding. Be careful brushing and flossing your teeth or using a toothpick because you may get an infection or bleed more easily. If you have any dental work done, tell your dentist you are receiving this medicine. Avoid taking products that contain aspirin, acetaminophen, ibuprofen, naproxen, or ketoprofen unless instructed by your doctor. These medicines may hide a fever. Do not become pregnant while taking this medicine. Women should inform their doctor if they wish to become pregnant or think they might be pregnant. There is a potential for serious side effects to an unborn child. Talk to your health care professional or pharmacist for more information. Do not breast-feed an infant while taking this medicine. What side effects may I  notice from receiving this medicine? Side effects that you should report to your doctor or health care professional as soon as possible: -allergic reactions like skin rash, itching or hives, swelling of the face, lips, or tongue -signs of infection - fever or chills, cough, sore throat, pain or difficulty passing urine -signs of decreased platelets or bleeding - bruising, pinpoint red spots on the skin, black, tarry stools, nosebleeds -signs of decreased red blood cells - unusually weak or tired, fainting spells, lightheadedness -breathing problems -changes in hearing -changes in vision -chest pain -high blood pressure -low blood counts - This drug may decrease the number of white blood cells, red blood cells and platelets. You may be at increased risk for infections and bleeding. -nausea and vomiting -pain, swelling, redness or irritation at the injection site -pain, tingling, numbness in the hands or feet -problems with balance, talking, walking -trouble passing urine or change in the amount of urine Side effects that usually do not require medical attention (report to your doctor or health care professional if they continue or are bothersome): -  hair loss -loss of appetite -metallic taste in the mouth or changes in taste This list may not describe all possible side effects. Call your doctor for medical advice about side effects. You may report side effects to FDA at 1-800-FDA-1088. Where should I keep my medicine? This drug is given in a hospital or clinic and will not be stored at home. NOTE: This sheet is a summary. It may not cover all possible information. If you have questions about this medicine, talk to your doctor, pharmacist, or health care provider.  2018 Elsevier/Gold Standard (2007-05-14 14:38:05)  

## 2018-01-31 NOTE — H&P (Signed)
[facility] History & Physical Update  The patient was interviewed and re-examined.  The patient's previous History and Physical has been reviewed and is unchanged.  There is no change in the plan of care. We plan to proceed with the scheduled procedure.  Leotis Pain, MD  01/31/2018, 8:48 AM

## 2018-01-31 NOTE — Discharge Instructions (Signed)
Implanted Port Home Guide  An implanted port is a type of central line that is placed under the skin. Central lines are used to provide IV access when treatment or nutrition needs to be given through a person's veins. Implanted ports are used for long-term IV access. An implanted port may be placed because: You need IV medicine that would be irritating to the small veins in your hands or arms. You need long-term IV medicines, such as antibiotics. You need IV nutrition for a long period. You need frequent blood draws for lab tests. You need dialysis.   Implanted ports are usually placed in the chest area, but they can also be placed in the upper arm, the abdomen, or the leg. An implanted port has two main parts: Reservoir. The reservoir is round and will appear as a small, raised area under your skin. The reservoir is the part where a needle is inserted to give medicines or draw blood. Catheter. The catheter is a thin, flexible tube that extends from the reservoir. The catheter is placed into a large vein. Medicine that is inserted into the reservoir goes into the catheter and then into the vein.   How will I care for my incision  You may shower tomorrow  How is my port accessed? Special steps must be taken to access the port: Before the port is accessed, a numbing cream can be placed on the skin. This helps numb the skin over the port site. Your health care provider uses a sterile technique to access the port. Your health care provider must put on a mask and sterile gloves. The skin over your port is cleaned carefully with an antiseptic and allowed to dry. The port is gently pinched between sterile gloves, and a needle is inserted into the port. Only "non-coring" port needles should be used to access the port. Once the port is accessed, a blood return should be checked. This helps ensure that the port is in the vein and is not clogged. If your port needs to remain accessed for a constant  infusion, a clear (transparent) bandage will be placed over the needle site. The bandage and needle will need to be changed every week, or as directed by your health care provider.   What is flushing? Flushing helps keep the port from getting clogged. Follow your health care provider's instructions on how and when to flush the port. Ports are usually flushed with saline solution or a medicine called heparin. The need for flushing will depend on how the port is used. If the port is used for intermittent medicines or blood draws, the port will need to be flushed: After medicines have been given. After blood has been drawn. As part of routine maintenance. If a constant infusion is running, the port may not need to be flushed.   How long will my port stay implanted? The port can stay in for as long as your health care provider thinks it is needed. When it is time for the port to come out, surgery will be done to remove it. The procedure is similar to the one performed when the port was put in. When should I seek immediate medical care? When you have an implanted port, you should seek immediate medical care if: You notice a bad smell coming from the incision site. You have swelling, redness, or drainage at the incision site. You have more swelling or pain at the port site or the surrounding area. You have a fever that   is not controlled with medicine.   This information is not intended to replace advice given to you by your health care provider. Make sure you discuss any questions you have with your health care provider. Document Released: 02/06/2005 Document Revised: 07/15/2015 Document Reviewed: 10/14/2012 Elsevier Interactive Patient Education  2017 Elsevier Inc.    

## 2018-02-01 ENCOUNTER — Inpatient Hospital Stay: Payer: Medicare Other

## 2018-02-04 ENCOUNTER — Telehealth: Payer: Self-pay | Admitting: *Deleted

## 2018-02-04 ENCOUNTER — Ambulatory Visit
Admission: RE | Admit: 2018-02-04 | Discharge: 2018-02-04 | Disposition: A | Discharge status: Home / Self Care | Payer: Medicare Other | Source: Ambulatory Visit | Attending: Radiation Oncology | Admitting: Radiation Oncology

## 2018-02-04 DIAGNOSIS — C3490 Malignant neoplasm of unspecified part of unspecified bronchus or lung: Secondary | ICD-10-CM | POA: Diagnosis not present

## 2018-02-04 DIAGNOSIS — C7951 Secondary malignant neoplasm of bone: Secondary | ICD-10-CM | POA: Diagnosis not present

## 2018-02-04 DIAGNOSIS — R51 Headache: Secondary | ICD-10-CM | POA: Diagnosis not present

## 2018-02-04 DIAGNOSIS — Z87891 Personal history of nicotine dependence: Secondary | ICD-10-CM | POA: Diagnosis not present

## 2018-02-04 DIAGNOSIS — Z510 Encounter for antineoplastic radiation therapy: Secondary | ICD-10-CM | POA: Diagnosis not present

## 2018-02-04 NOTE — Telephone Encounter (Signed)
Wife called reporting that since patient started taking Prednisone, his appetite is gone. Asking what to do. Please advise

## 2018-02-04 NOTE — Telephone Encounter (Signed)
Wife informed per VO Dr Tasia Catchings have patient stop taking it. She repeated this back to me

## 2018-02-05 ENCOUNTER — Inpatient Hospital Stay: Payer: Medicare Other

## 2018-02-05 ENCOUNTER — Encounter: Payer: Self-pay | Admitting: Oncology

## 2018-02-05 ENCOUNTER — Other Ambulatory Visit: Payer: Self-pay

## 2018-02-05 ENCOUNTER — Encounter: Payer: Self-pay | Admitting: *Deleted

## 2018-02-05 ENCOUNTER — Ambulatory Visit
Admission: RE | Admit: 2018-02-05 | Discharge: 2018-02-05 | Disposition: A | Discharge status: Home / Self Care | Payer: Medicare Other | Source: Ambulatory Visit | Attending: Radiation Oncology | Admitting: Radiation Oncology

## 2018-02-05 ENCOUNTER — Inpatient Hospital Stay (HOSPITAL_BASED_OUTPATIENT_CLINIC_OR_DEPARTMENT_OTHER): Payer: Medicare Other | Admitting: Oncology

## 2018-02-05 VITALS — BP 132/75 | HR 92 | Temp 96.9°F | Resp 18 | Wt 126.8 lb

## 2018-02-05 DIAGNOSIS — Z8249 Family history of ischemic heart disease and other diseases of the circulatory system: Secondary | ICD-10-CM | POA: Diagnosis not present

## 2018-02-05 DIAGNOSIS — Z8719 Personal history of other diseases of the digestive system: Secondary | ICD-10-CM | POA: Diagnosis not present

## 2018-02-05 DIAGNOSIS — E785 Hyperlipidemia, unspecified: Secondary | ICD-10-CM

## 2018-02-05 DIAGNOSIS — Z7189 Other specified counseling: Secondary | ICD-10-CM

## 2018-02-05 DIAGNOSIS — Z79891 Long term (current) use of opiate analgesic: Secondary | ICD-10-CM

## 2018-02-05 DIAGNOSIS — Z833 Family history of diabetes mellitus: Secondary | ICD-10-CM

## 2018-02-05 DIAGNOSIS — Z5112 Encounter for antineoplastic immunotherapy: Secondary | ICD-10-CM

## 2018-02-05 DIAGNOSIS — Z79899 Other long term (current) drug therapy: Secondary | ICD-10-CM

## 2018-02-05 DIAGNOSIS — M899 Disorder of bone, unspecified: Secondary | ICD-10-CM | POA: Diagnosis not present

## 2018-02-05 DIAGNOSIS — B370 Candidal stomatitis: Secondary | ICD-10-CM | POA: Diagnosis present

## 2018-02-05 DIAGNOSIS — Z7982 Long term (current) use of aspirin: Secondary | ICD-10-CM | POA: Diagnosis not present

## 2018-02-05 DIAGNOSIS — R6881 Early satiety: Secondary | ICD-10-CM | POA: Insufficient documentation

## 2018-02-05 DIAGNOSIS — M542 Cervicalgia: Secondary | ICD-10-CM | POA: Diagnosis not present

## 2018-02-05 DIAGNOSIS — C7A8 Other malignant neuroendocrine tumors: Secondary | ICD-10-CM

## 2018-02-05 DIAGNOSIS — G893 Neoplasm related pain (acute) (chronic): Secondary | ICD-10-CM | POA: Diagnosis not present

## 2018-02-05 DIAGNOSIS — Z87891 Personal history of nicotine dependence: Secondary | ICD-10-CM | POA: Diagnosis not present

## 2018-02-05 DIAGNOSIS — Z800 Family history of malignant neoplasm of digestive organs: Secondary | ICD-10-CM | POA: Diagnosis not present

## 2018-02-05 DIAGNOSIS — R51 Headache: Secondary | ICD-10-CM

## 2018-02-05 DIAGNOSIS — Z888 Allergy status to other drugs, medicaments and biological substances status: Secondary | ICD-10-CM | POA: Diagnosis not present

## 2018-02-05 DIAGNOSIS — C7A090 Malignant carcinoid tumor of the bronchus and lung: Secondary | ICD-10-CM

## 2018-02-05 DIAGNOSIS — C7951 Secondary malignant neoplasm of bone: Secondary | ICD-10-CM | POA: Diagnosis present

## 2018-02-05 DIAGNOSIS — J449 Chronic obstructive pulmonary disease, unspecified: Secondary | ICD-10-CM | POA: Diagnosis present

## 2018-02-05 DIAGNOSIS — Z880 Allergy status to penicillin: Secondary | ICD-10-CM | POA: Diagnosis not present

## 2018-02-05 DIAGNOSIS — Z820 Family history of epilepsy and other diseases of the nervous system: Secondary | ICD-10-CM | POA: Diagnosis not present

## 2018-02-05 DIAGNOSIS — Z5111 Encounter for antineoplastic chemotherapy: Secondary | ICD-10-CM

## 2018-02-05 DIAGNOSIS — R634 Abnormal weight loss: Secondary | ICD-10-CM

## 2018-02-05 LAB — TSH: TSH: 4.938 u[IU]/mL — ABNORMAL HIGH (ref 0.350–4.500)

## 2018-02-05 LAB — COMPREHENSIVE METABOLIC PANEL
ALT: 14 U/L (ref 0–44)
ANION GAP: 11 (ref 5–15)
AST: 39 U/L (ref 15–41)
Albumin: 4.1 g/dL (ref 3.5–5.0)
Alkaline Phosphatase: 92 U/L (ref 38–126)
BUN: 17 mg/dL (ref 8–23)
CALCIUM: 11.9 mg/dL — AB (ref 8.9–10.3)
CO2: 30 mmol/L (ref 22–32)
Chloride: 99 mmol/L (ref 98–111)
Creatinine, Ser: 1.05 mg/dL (ref 0.61–1.24)
GFR calc Af Amer: >60 mL/min (ref >60)
GFR calc non Af Amer: >60 mL/min (ref >60)
Glucose, Bld: 109 mg/dL — ABNORMAL HIGH (ref 70–99)
Potassium: 3.6 mmol/L (ref 3.5–5.1)
Sodium: 140 mmol/L (ref 135–145)
Total Bilirubin: 0.8 mg/dL (ref 0.3–1.2)
Total Protein: 7.7 g/dL (ref 6.5–8.1)

## 2018-02-05 LAB — CBC WITH DIFFERENTIAL/PLATELET
Abs Immature Granulocytes: 0.07 10*3/uL (ref 0.00–0.07)
Basophils Absolute: 0 10*3/uL (ref 0.0–0.1)
Basophils Relative: 0 %
Eosinophils Absolute: 0.2 10*3/uL (ref 0.0–0.5)
Eosinophils Relative: 2 %
HCT: 35.2 % — ABNORMAL LOW (ref 39.0–52.0)
Hemoglobin: 12 g/dL — ABNORMAL LOW (ref 13.0–17.0)
IMMATURE GRANULOCYTES: 1 %
Lymphocytes Relative: 14 %
Lymphs Abs: 1.4 10*3/uL (ref 0.7–4.0)
MCH: 29.1 pg (ref 26.0–34.0)
MCHC: 34.1 g/dL (ref 30.0–36.0)
MCV: 85.2 fL (ref 80.0–100.0)
Monocytes Absolute: 1.3 10*3/uL — ABNORMAL HIGH (ref 0.1–1.0)
Monocytes Relative: 12 %
Neutro Abs: 7.4 10*3/uL (ref 1.7–7.7)
Neutrophils Relative %: 71 %
Platelets: 244 10*3/uL (ref 150–400)
RBC: 4.13 MIL/uL — ABNORMAL LOW (ref 4.22–5.81)
RDW: 13.1 % (ref 11.5–15.5)
WBC: 10.3 10*3/uL (ref 4.0–10.5)
nRBC: 0 % (ref 0.0–0.2)

## 2018-02-05 MED ORDER — DRONABINOL 5 MG PO CAPS: 5.0000 mg | ORAL_CAPSULE | Freq: Two times a day (BID) | ORAL | 0 refills | Status: DC

## 2018-02-05 MED ORDER — SODIUM CHLORIDE 0.9 % IV SOLN
1200.0000 mg | Freq: Once | INTRAVENOUS | Status: AC
  Administered 2018-02-05: 1200 mg via INTRAVENOUS
  Filled 2018-02-05: qty 20

## 2018-02-05 MED ORDER — ZOLEDRONIC ACID 4 MG/100ML IV SOLN: 4.0000 mg | Freq: Once | INTRAVENOUS | Status: DC

## 2018-02-05 MED ORDER — PALONOSETRON HCL INJECTION 0.25 MG/5ML
0.2500 mg | Freq: Once | INTRAVENOUS | Status: AC
  Administered 2018-02-05: 0.25 mg via INTRAVENOUS
  Filled 2018-02-05: qty 5

## 2018-02-05 MED ORDER — SODIUM CHLORIDE 0.9 % IV SOLN
Freq: Once | INTRAVENOUS | Status: AC
  Administered 2018-02-05: 12:00:00 via INTRAVENOUS
  Filled 2018-02-05: qty 250

## 2018-02-05 MED ORDER — SODIUM CHLORIDE 0.9 % IV SOLN
Freq: Once | INTRAVENOUS | Status: AC
  Administered 2018-02-05: 12:00:00 via INTRAVENOUS
  Filled 2018-02-05: qty 5

## 2018-02-05 MED ORDER — HEPARIN SOD (PORK) LOCK FLUSH 100 UNIT/ML IV SOLN
500.0000 [IU] | Freq: Once | INTRAVENOUS | Status: AC
  Administered 2018-02-05: 500 [IU] via INTRAVENOUS

## 2018-02-05 MED ORDER — SODIUM CHLORIDE 0.9% FLUSH
10.0000 mL | INTRAVENOUS | Status: DC | PRN
  Filled 2018-02-05: qty 10

## 2018-02-05 MED ORDER — SODIUM CHLORIDE 0.9 % IV SOLN
100.0000 mg/m2 | Freq: Once | INTRAVENOUS | Status: AC
  Administered 2018-02-05: 170 mg via INTRAVENOUS
  Filled 2018-02-05: qty 8.5

## 2018-02-05 MED ORDER — SODIUM CHLORIDE 0.9 % IV SOLN
360.0000 mg | Freq: Once | INTRAVENOUS | Status: AC
  Administered 2018-02-05: 360 mg via INTRAVENOUS
  Filled 2018-02-05: qty 36

## 2018-02-05 MED ORDER — HEPARIN SOD (PORK) LOCK FLUSH 100 UNIT/ML IV SOLN: 500.0000 [IU] | Freq: Once | INTRAVENOUS | Status: DC

## 2018-02-05 MED ORDER — ZOLEDRONIC ACID 4 MG/5ML IV CONC
3.3000 mg | Freq: Once | INTRAVENOUS | Status: AC
  Administered 2018-02-05: 3.3 mg via INTRAVENOUS
  Filled 2018-02-05: qty 4.13

## 2018-02-05 NOTE — Research (Signed)
Human Biospecimens for the Discovery and Validation of Biomarkers for the Prediction, Diagnosis and Management of Disease  [id number] - [id number] PIBobbye Riggs, MD Attending: Yu Visit: Introduction of study  I met with patient, wife and granddaughter today at his physician appointment to introduce the above research study.  Patient and family agreed to take consent form home to review (IRB approved 09/06/2017).  The patient was provided with the contact information for the research associate.   Lula Olszewski Oncology Research Assistant 02/05/2018 10:22 AM

## 2018-02-05 NOTE — Progress Notes (Signed)
Patient receiving Zometa today per Dr. Tasia Catchings

## 2018-02-05 NOTE — Progress Notes (Signed)
Patient here for first treatment of carbo, tecentriq, etoposide. Per wife, Pts appetite decreased and is unsure if its prednisone or oxycodone. Pt states that pain has gotten better since he started oxycodone.

## 2018-02-05 NOTE — Progress Notes (Signed)
  Oncology Nurse Navigator Documentation  Navigator Location: [facility] (02/05/18 1100)   )Navigator Encounter Type: Treatment (02/05/18 1100)                   Treatment Initiated Date: 02/05/18 (02/05/18 1100) Patient Visit Type: MedOnc (02/05/18 1100) Treatment Phase: First Chemo Tx (02/05/18 1100) Barriers/Navigation Needs: No barriers at this time (02/05/18 1100)   Interventions: None required (02/05/18 1100)                      Time Spent with Patient: 30 (02/05/18 1100)

## 2018-02-05 NOTE — Progress Notes (Signed)
Hematology/Oncology follow up note [facility] [phone] [phone]   Patient Care Team: Tonia Ghent, MD as PCP - General (Family Medicine) Eustace Moore, MD as Consulting Physician (Neurosurgery) Telford Nab, RN as Registered Nurse  REFERRING PROVIDER: Tonia Ghent, MD REASON FOR VISIT:  Follow-up for poorly differentiated neuroendocrine cancer  HISTORY OF PRESENTING ILLNESS:  Joseph Parker is a  77 y.o.  male with PMH listed below who was referred to me for evaluation of bone lytic lesion.   patient  presented to primary care physician for evaluation of right side tongue discomfort, loss of appetite for 2 weeks.  As well as 1 months of right groin pain feels like a pulled muscle.  Patient was treated with 10 days of prednisone as well as gabapentin 100 mg daily. Patient reports that prednisone has significantly helped patient's appetite however he developed thrush while on prednisone.  He was treated with nystatin solution with some improvement.  Persistent abnormal sensation. Patient was referred to ENT for further evaluation.  MRI was scanned for right tongue deviation  Also had CT abdomen pelvis with contrast on 12/28/2017 which showed abdominal lytic bone lesion in the right lesser trochanter, right sacrum, right ilium and T12 vertebral body most concerning for malignancy secondary to multiple myeloma versus metastasis disease. Pathologic avulsion fracture of the right lesser trochanter.   Patient reports chronic lower back pain, no recent injury.  No aggravating or alleviating factors.  Weight loss about 8 pounds for the past 4 weeks. accompanied by son, and wife  INTERVAL HISTORY Joseph Parker is a 77 y.o. male who has above history reviewed by me today presents for follow up visit for management of metastatic poorly differentiated neuroendocrine carcinoma Patient presents for evaluation prior to cycle 1 palliative  chemotherapy.   #Neoplasm related pain, reports pain has improved, he takes  Xtampza 13.5 mg BID and reports pain level decreased to 4-5. He does not use Norco any more.  Has established care with RadOnc and will start palliative RT this week.   # Also has started on prednisone for 3 days for bone pain and felt appetite has decreased.   Review of Systems  Constitutional: Positive for malaise/fatigue and weight loss. Negative for chills and fever.  HENT: Negative for nosebleeds and sore throat.   Eyes: Negative for double vision, photophobia and redness.  Respiratory: Negative for cough, shortness of breath and wheezing.   Cardiovascular: Negative for chest pain, palpitations, orthopnea and leg swelling.  Gastrointestinal: Negative for abdominal pain, blood in stool, nausea and vomiting.  Genitourinary: Negative for dysuria.  Musculoskeletal: Positive for back pain and joint pain. Negative for myalgias and neck pain.  Skin: Negative for itching and rash.  Neurological: Negative for dizziness, tingling and tremors.       Decreased food taste.    Endo/Heme/Allergies: Negative for environmental allergies. Does not bruise/bleed easily.  Psychiatric/Behavioral: Negative for depression and hallucinations.    MEDICAL HISTORY:  Past Medical History:  Diagnosis Date  . Cancer ([place])   . Carotid arterial disease ([place])   . Carotid artery disease ([place])   . Chickenpox   . Colon polyps   . COPD (chronic obstructive pulmonary disease) ([place])   . Hyperlipidemia   . Neuroendocrine cancer ([place]) 01/28/2018  . Normal cardiac stress test    06/2013  . Shortness of breath dyspnea   . Urinary frequency   . Urinary hesitancy     SURGICAL HISTORY: Past Surgical  History:  Procedure Laterality Date  . CATARACT EXTRACTION, BILATERAL    . COLONOSCOPY    . ESOPHAGOGASTRODUODENOSCOPY    . HERNIA REPAIR     right inquinal - as child  . LUMBAR LAMINECTOMY/DECOMPRESSION MICRODISCECTOMY Left 10/14/2014    Procedure: Left Lumbar three-four extraforaminla microdiskectomy;  Surgeon: Eustace Moore, MD;  Location: [facility];  Service: Neurosurgery;  Laterality: Left;  Left L34 extraforaminal microdiskectomy  . POLYPECTOMY    . PORTA CATH INSERTION N/A 01/31/2018   Procedure: PORTA CATH INSERTION;  Surgeon: Algernon Huxley, MD;  Location: [place] [facility];  Service: Cardiovascular;  Laterality: N/A;  . TONSILLECTOMY     remote - childhood  . UPPER GASTROINTESTINAL ENDOSCOPY      SOCIAL HISTORY: Social History   Socioeconomic History  . Marital status: Married    Spouse name: Not on file  . Number of children: 5  . Years of education: 41  . Highest education level: Not on file  Occupational History  . Occupation: mfg Librarian, academic    Comment: retired  Scientific laboratory technician  . Financial resource strain: Not on file  . Food insecurity:    Worry: Not on file    Inability: Not on file  . Transportation needs:    Medical: Not on file    Non-medical: Not on file  Tobacco Use  . Smoking status: Former Smoker    Packs/day: 0.50    Years: 40.00    Pack years: 20.00    Last attempt to quit: 07/20/1999    Years since quitting: 18.5  . Smokeless tobacco: Never Used  Substance and Sexual Activity  . Alcohol use: No    Alcohol/week: 0.0 standard drinks  . Drug use: No  . Sexual activity: Yes    Birth control/protection: Post-menopausal  Lifestyle  . Physical activity:    Days per week: Not on file    Minutes per session: Not on file  . Stress: Not on file  Relationships  . Social connections:    Talks on phone: Not on file    Gets together: Not on file    Attends religious service: Not on file    Active member of club or organization: Not on file    Attends meetings of clubs or organizations: Not on file    Relationship status: Not on file  . Intimate partner violence:    Fear of current or ex partner: Not on file    Emotionally abused: Not on file    Physically abused: Not on file     Forced sexual activity: Not on file  Other Topics Concern  . Not on file  Social History Narrative   HSG. Married '63.    Previous marriage - 3 years/divorce. 5 sons. 7 grandchildren.   Work - Financial planner - retired.    Active in community and in his church   [employer] fan    FAMILY HISTORY: Family History  Problem Relation Age of Onset  . Heart disease Mother        CABG; Valve replacement - died post-op  . Alzheimer's disease Father   . Diabetes Father   . Diabetes Son   . Colon cancer Sister   . Prostate cancer Neg Hx   . Colon polyps Neg Hx     ALLERGIES:  is allergic to penicillins and tessalon [benzonatate].  MEDICATIONS:  Current Outpatient Medications  Medication Sig Dispense Refill  . albuterol (PROVENTIL HFA;VENTOLIN HFA) 108 (90 Base) MCG/ACT inhaler Inhale  2 puffs into the lungs every 6 (six) hours as needed for wheezing or shortness of breath. 1 Inhaler 12  . aspirin EC 81 MG tablet Take 1 tablet (81 mg total) by mouth daily.    Marland Kitchen atorvastatin (LIPITOR) 10 MG tablet Take 1 tablet (10 mg total) by mouth daily. 90 tablet 3  . Fluticasone-Salmeterol (ADVAIR DISKUS) 250-50 MCG/DOSE AEPB Inhale 1 puff into the lungs 2 (two) times daily. 60 each prn  . lidocaine-prilocaine (EMLA) cream Apply to affected area once 30 g 3  . ondansetron (ZOFRAN) 8 MG tablet Take 1 tablet (8 mg total) by mouth 2 (two) times daily as needed for refractory nausea / vomiting. Start on day 3 after carboplatin chemo. 30 tablet 1  . oxyCODONE ER (XTAMPZA ER) 13.5 MG C12A Take 13.5 mg by mouth every 12 (twelve) hours. 30 each 0  . polyethylene glycol powder (GLYCOLAX/MIRALAX) powder Take 17 g by mouth 2 (two) times daily as needed.    . prochlorperazine (COMPAZINE) 10 MG tablet Take 1 tablet (10 mg total) by mouth every 6 (six) hours as needed (Nausea or vomiting). 30 tablet 1  . tiotropium (SPIRIVA) 18 MCG inhalation capsule Place 1 capsule (18 mcg total) into inhaler and inhale daily. (Patient  taking differently: Place 18 mcg into inhaler and inhale daily as needed. ) 30 capsule 12  . HYDROcodone-acetaminophen (NORCO) 5-325 MG tablet Take 1 tablet by mouth every 6 (six) hours as needed for severe pain. (Patient not taking: Reported on 02/05/2018) 30 tablet 0  . lidocaine (LIDODERM) 5 % Place 1 patch onto the skin every 12 (twelve) hours. Remove & Discard patch within 12 hours or as directed by MD (Patient not taking: Reported on 02/05/2018) 10 patch 0  . predniSONE (DELTASONE) 5 MG tablet Take 1 tablet (5 mg total) by mouth daily with breakfast. (Patient not taking: Reported on 02/05/2018) 30 tablet 0   No current facility-administered medications for this visit.      PHYSICAL EXAMINATION: ECOG PERFORMANCE STATUS: 1 - Symptomatic but completely ambulatory Vitals:   02/05/18 0956  BP: 132/75  Pulse: 92  Resp: 18  Temp: (!) 96.9 F (36.1 C)   Filed Weights   02/05/18 0956  Weight: 126 lb 12.8 oz (57.5 kg)    Physical Exam Constitutional:      General: He is not in acute distress. HENT:     Head: Normocephalic and atraumatic.     Mouth/Throat:     Comments: Thrush  Eyes:     General: No scleral icterus.    Pupils: Pupils are equal, round, and reactive to light.  Neck:     Musculoskeletal: Normal range of motion and neck supple.  Cardiovascular:     Rate and Rhythm: Normal rate and regular rhythm.     Heart sounds: Normal heart sounds.  Pulmonary:     Effort: Pulmonary effort is normal. No respiratory distress.     Breath sounds: No wheezing.  Abdominal:     General: Bowel sounds are normal. There is no distension.     Palpations: Abdomen is soft. There is no mass.     Tenderness: There is no abdominal tenderness.  Musculoskeletal: Normal range of motion.        General: No deformity.  Skin:    General: Skin is warm and dry.     Findings: No erythema or rash.  Neurological:     Mental Status: He is alert and oriented to person, place, and time.  Cranial  Nerves: No cranial nerve deficit.     Coordination: Coordination normal.  Psychiatric:        Behavior: Behavior normal.        Thought Content: Thought content normal.      LABORATORY DATA:  I have reviewed the data as listed Lab Results  Component Value Date   WBC 10.3 02/05/2018   HGB 12.0 (L) 02/05/2018   HCT 35.2 (L) 02/05/2018   MCV 85.2 02/05/2018   PLT 244 02/05/2018   Recent Labs    12/19/17 1649  01/02/18 1606 01/20/18 1252 02/05/18 0858  NA 139  --   --  136 140  K 4.0  --   --  4.3 3.6  CL 101  --   --  97* 99  CO2 30  --   --  27 30  GLUCOSE 87  --   --  107* 109*  BUN 11  --   --  13 17  CREATININE 0.92  --   --  0.79 1.05  CALCIUM 10.0  --   --  10.3 11.9*  GFRNONAA  --   --   --  >60 >60  GFRAA  --   --   --  >60 >60  PROT 7.5   < > 7.7 7.8 7.7  ALBUMIN 4.5  --  4.7 4.3 4.1  AST 17  --  21 29 39  ALT 8  --  _0 ALKPHOS 86  --  90 92 92  BILITOT 0.9  --  0.8 1.2 0.8  BILIDIR  --   --  0.1  --   --   IBILI  --   --  0.7  --   --    < > = values in this interval not displayed.   Iron/TIBC/Ferritin/ %Sat No results found for: IRON, TIBC, FERRITIN, IRONPCTSAT   05/01/2017 Cologuard negative.   RADIOGRAPHIC STUDIES: I have personally reviewed the radiological images as listed and agreed with the findings in the report. 12/28/2017 CT abdomen pelvis w contrast 1. Abnormal lytic bone lesions in the right lesser trochanter, right sacrum, right ilium and T12 vertebral body most concerning for malignancy secondary to multiple myeloma versus metastatic disease. 2. Pathologic avulsion fracture of the right lesser trochanter. 3. Bilateral renal cysts. 4.  Aortic Atherosclerosis (ICD10-I70.0).  ASSESSMENT & PLAN:  1. Neuroendocrine cancer ([place])   2. Hypercalcemia   3. Goals of care, counseling/discussion   4. Bone lesion   5. Weight loss   6. Early satiety   7. Encounter for antineoplastic chemotherapy   8. Encounter for antineoplastic  immunotherapy   9. Neoplasm related pain    # Large cell neuroendocrine carcinoma.  Case was discussed on tumor board on 01/31/2018, consensus reached to proceed with palliative chemotherapy with palliative RT. Discussed with patient and his family memebers.  Goal of care was discussed with patient. Patient and family members understand that patient's disease is not curable, goal of treatment is with palliative intent.  Labs are reviewed. Counts are acceptable to proceed with cycle 1 Carboplatin and Etoposide and Tecentriq.  # Growth factor-Udenyca would be given as prophylaxis for chemotherapy-induced neutropenia to prevent febrile neutropenias. Discussed potential side effect- myalgias/arthralgias- recommend Claritin for 4 days.    # Thrush, advise patient to start Nystatin oral rinse swish and spit three times a day.   # Weight loss and loss of appetite. Start Marinol 28m BID.  # neoplasm induced pain, continue   #  Neoplasm induced pain: add Xtampza 13.5 mg BID, continue Norco 5/335m Q4-6 hours as needed.  # Hypercalcemia, due to malignancy, will give a dose of Zometa today. Encourage patient to stay well hydrated.  Repeat cbc, cmp on 02/11/2018.   We spent sufficient time to discuss many aspect of care, questions were answered to patient's satisfaction.  The patient knows to call the clinic with any problems questions or concerns. Total face to face encounter time for this patient visit was 40 min. >50% of the time was  spent in counseling and coordination of care.  Return of visit: 02/11/2018 repeat labs, and follow up with NP. Possible hydration.   ZEarlie Server MD, PhD Hematology Oncology [facility]Al[facility] Centerat AFilutowski Eye Institute Pa Dba Sunrise Surgical CenterPager- [phone]/17/2019

## 2018-02-06 ENCOUNTER — Inpatient Hospital Stay: Payer: Medicare Other

## 2018-02-06 ENCOUNTER — Ambulatory Visit
Admission: RE | Admit: 2018-02-06 | Discharge: 2018-02-06 | Disposition: A | Discharge status: Home / Self Care | Payer: Medicare Other | Source: Ambulatory Visit | Attending: Radiation Oncology | Admitting: Radiation Oncology

## 2018-02-06 DIAGNOSIS — C7A8 Other malignant neuroendocrine tumors: Secondary | ICD-10-CM

## 2018-02-06 MED ORDER — DEXAMETHASONE SODIUM PHOSPHATE 10 MG/ML IJ SOLN
10.0000 mg | Freq: Once | INTRAMUSCULAR | Status: AC
  Administered 2018-02-06: 10 mg via INTRAVENOUS
  Filled 2018-02-06: qty 1

## 2018-02-06 MED ORDER — SODIUM CHLORIDE 0.9 % IV SOLN
Freq: Once | INTRAVENOUS | Status: AC
  Administered 2018-02-06: 14:00:00 via INTRAVENOUS
  Filled 2018-02-06: qty 250

## 2018-02-06 MED ORDER — HEPARIN SOD (PORK) LOCK FLUSH 100 UNIT/ML IV SOLN
500.0000 [IU] | Freq: Once | INTRAVENOUS | Status: AC | PRN
  Administered 2018-02-06: 500 [IU]
  Filled 2018-02-06: qty 5

## 2018-02-06 MED ORDER — SODIUM CHLORIDE 0.9 % IV SOLN
100.0000 mg/m2 | Freq: Once | INTRAVENOUS | Status: AC
  Administered 2018-02-06: 170 mg via INTRAVENOUS
  Filled 2018-02-06: qty 8.5

## 2018-02-07 ENCOUNTER — Inpatient Hospital Stay: Payer: Medicare Other

## 2018-02-07 ENCOUNTER — Ambulatory Visit
Admission: RE | Admit: 2018-02-07 | Discharge: 2018-02-07 | Disposition: A | Discharge status: Home / Self Care | Payer: Medicare Other | Source: Ambulatory Visit | Attending: Radiation Oncology | Admitting: Radiation Oncology

## 2018-02-07 VITALS — BP 119/66 | HR 69 | Temp 96.8°F | Resp 18

## 2018-02-07 DIAGNOSIS — C7A8 Other malignant neuroendocrine tumors: Secondary | ICD-10-CM

## 2018-02-07 MED ORDER — DEXAMETHASONE SODIUM PHOSPHATE 10 MG/ML IJ SOLN
10.0000 mg | Freq: Once | INTRAMUSCULAR | Status: AC
  Administered 2018-02-07: 10 mg via INTRAVENOUS
  Filled 2018-02-07: qty 1

## 2018-02-07 MED ORDER — HEPARIN SOD (PORK) LOCK FLUSH 100 UNIT/ML IV SOLN
500.0000 [IU] | Freq: Once | INTRAVENOUS | Status: AC | PRN
  Administered 2018-02-07: 500 [IU]
  Filled 2018-02-07: qty 5

## 2018-02-07 MED ORDER — SODIUM CHLORIDE 0.9 % IV SOLN
100.0000 mg/m2 | Freq: Once | INTRAVENOUS | Status: AC
  Administered 2018-02-07: 170 mg via INTRAVENOUS
  Filled 2018-02-07: qty 8.5

## 2018-02-07 MED ORDER — SODIUM CHLORIDE 0.9% FLUSH
10.0000 mL | INTRAVENOUS | Status: DC | PRN
  Administered 2018-02-07: 10 mL
  Filled 2018-02-07: qty 10

## 2018-02-07 MED ORDER — SODIUM CHLORIDE 0.9 % IV SOLN
Freq: Once | INTRAVENOUS | Status: AC
  Administered 2018-02-07: 13:00:00 via INTRAVENOUS
  Filled 2018-02-07: qty 250

## 2018-02-08 ENCOUNTER — Inpatient Hospital Stay (HOSPITAL_BASED_OUTPATIENT_CLINIC_OR_DEPARTMENT_OTHER): Payer: Medicare Other | Admitting: Oncology

## 2018-02-08 ENCOUNTER — Ambulatory Visit: Payer: Medicare Other

## 2018-02-08 ENCOUNTER — Encounter: Payer: Self-pay | Admitting: Oncology

## 2018-02-08 ENCOUNTER — Inpatient Hospital Stay: Payer: Medicare Other

## 2018-02-08 ENCOUNTER — Ambulatory Visit
Admission: RE | Admit: 2018-02-08 | Discharge: 2018-02-08 | Disposition: A | Discharge status: Home / Self Care | Payer: Medicare Other | Source: Ambulatory Visit | Attending: Oncology | Admitting: Oncology

## 2018-02-08 ENCOUNTER — Other Ambulatory Visit: Payer: Self-pay | Admitting: Oncology

## 2018-02-08 ENCOUNTER — Other Ambulatory Visit: Payer: Self-pay

## 2018-02-08 ENCOUNTER — Inpatient Hospital Stay
Admission: AD | Admit: 2018-02-08 | Discharge: 2018-02-10 | DRG: 948 | Disposition: A | Discharge status: Home / Self Care | Payer: Medicare Other | Source: Ambulatory Visit | Attending: Family Medicine | Admitting: Family Medicine

## 2018-02-08 ENCOUNTER — Telehealth: Payer: Self-pay | Admitting: *Deleted

## 2018-02-08 VITALS — BP 156/72 | HR 92 | Temp 96.3°F | Resp 20 | Wt 130.8 lb

## 2018-02-08 DIAGNOSIS — Z87891 Personal history of nicotine dependence: Secondary | ICD-10-CM

## 2018-02-08 DIAGNOSIS — Z7982 Long term (current) use of aspirin: Secondary | ICD-10-CM

## 2018-02-08 DIAGNOSIS — C7A8 Other malignant neuroendocrine tumors: Secondary | ICD-10-CM | POA: Diagnosis present

## 2018-02-08 DIAGNOSIS — M899 Disorder of bone, unspecified: Secondary | ICD-10-CM | POA: Diagnosis not present

## 2018-02-08 DIAGNOSIS — M542 Cervicalgia: Secondary | ICD-10-CM

## 2018-02-08 DIAGNOSIS — Z800 Family history of malignant neoplasm of digestive organs: Secondary | ICD-10-CM | POA: Diagnosis not present

## 2018-02-08 DIAGNOSIS — R634 Abnormal weight loss: Secondary | ICD-10-CM

## 2018-02-08 DIAGNOSIS — Z8249 Family history of ischemic heart disease and other diseases of the circulatory system: Secondary | ICD-10-CM | POA: Diagnosis not present

## 2018-02-08 DIAGNOSIS — Z95828 Presence of other vascular implants and grafts: Secondary | ICD-10-CM

## 2018-02-08 DIAGNOSIS — G893 Neoplasm related pain (acute) (chronic): Secondary | ICD-10-CM

## 2018-02-08 DIAGNOSIS — R51 Headache: Secondary | ICD-10-CM

## 2018-02-08 DIAGNOSIS — J449 Chronic obstructive pulmonary disease, unspecified: Secondary | ICD-10-CM | POA: Diagnosis present

## 2018-02-08 DIAGNOSIS — Z833 Family history of diabetes mellitus: Secondary | ICD-10-CM | POA: Diagnosis not present

## 2018-02-08 DIAGNOSIS — Z79899 Other long term (current) drug therapy: Secondary | ICD-10-CM

## 2018-02-08 DIAGNOSIS — C7A090 Malignant carcinoid tumor of the bronchus and lung: Secondary | ICD-10-CM | POA: Diagnosis not present

## 2018-02-08 DIAGNOSIS — R519 Headache, unspecified: Secondary | ICD-10-CM

## 2018-02-08 DIAGNOSIS — Z880 Allergy status to penicillin: Secondary | ICD-10-CM | POA: Diagnosis not present

## 2018-02-08 DIAGNOSIS — Z8719 Personal history of other diseases of the digestive system: Secondary | ICD-10-CM | POA: Diagnosis not present

## 2018-02-08 DIAGNOSIS — C7951 Secondary malignant neoplasm of bone: Secondary | ICD-10-CM | POA: Diagnosis present

## 2018-02-08 DIAGNOSIS — Z820 Family history of epilepsy and other diseases of the nervous system: Secondary | ICD-10-CM | POA: Diagnosis not present

## 2018-02-08 DIAGNOSIS — Z79891 Long term (current) use of opiate analgesic: Secondary | ICD-10-CM | POA: Diagnosis not present

## 2018-02-08 DIAGNOSIS — Z888 Allergy status to other drugs, medicaments and biological substances status: Secondary | ICD-10-CM | POA: Diagnosis not present

## 2018-02-08 DIAGNOSIS — B370 Candidal stomatitis: Secondary | ICD-10-CM | POA: Diagnosis present

## 2018-02-08 DIAGNOSIS — R6881 Early satiety: Secondary | ICD-10-CM

## 2018-02-08 DIAGNOSIS — E785 Hyperlipidemia, unspecified: Secondary | ICD-10-CM | POA: Diagnosis present

## 2018-02-08 LAB — CBC WITH DIFFERENTIAL/PLATELET
Abs Immature Granulocytes: 0.07 10*3/uL (ref 0.00–0.07)
Basophils Absolute: 0 10*3/uL (ref 0.0–0.1)
Basophils Relative: 0 %
Eosinophils Absolute: 0 10*3/uL (ref 0.0–0.5)
Eosinophils Relative: 0 %
HCT: 34.6 % — ABNORMAL LOW (ref 39.0–52.0)
Hemoglobin: 11.6 g/dL — ABNORMAL LOW (ref 13.0–17.0)
Immature Granulocytes: 1 %
LYMPHS ABS: 0.8 10*3/uL (ref 0.7–4.0)
Lymphocytes Relative: 6 %
MCH: 28.5 pg (ref 26.0–34.0)
MCHC: 33.5 g/dL (ref 30.0–36.0)
MCV: 85 fL (ref 80.0–100.0)
MONOS PCT: 2 %
Monocytes Absolute: 0.2 10*3/uL (ref 0.1–1.0)
Neutro Abs: 12.9 10*3/uL — ABNORMAL HIGH (ref 1.7–7.7)
Neutrophils Relative %: 91 %
Platelets: 269 10*3/uL (ref 150–400)
RBC: 4.07 MIL/uL — ABNORMAL LOW (ref 4.22–5.81)
RDW: 13.2 % (ref 11.5–15.5)
WBC: 14.1 10*3/uL — ABNORMAL HIGH (ref 4.0–10.5)
nRBC: 0 % (ref 0.0–0.2)

## 2018-02-08 LAB — COMPREHENSIVE METABOLIC PANEL
ALT: 14 U/L (ref 0–44)
AST: 39 U/L (ref 15–41)
Albumin: 3.8 g/dL (ref 3.5–5.0)
Alkaline Phosphatase: 90 U/L (ref 38–126)
Anion gap: 10 (ref 5–15)
BUN: 21 mg/dL (ref 8–23)
CO2: 24 mmol/L (ref 22–32)
Calcium: 8.8 mg/dL — ABNORMAL LOW (ref 8.9–10.3)
Chloride: 106 mmol/L (ref 98–111)
Creatinine, Ser: 0.73 mg/dL (ref 0.61–1.24)
GFR calc Af Amer: >60 mL/min (ref >60)
GFR calc non Af Amer: >60 mL/min (ref >60)
Glucose, Bld: 93 mg/dL (ref 70–99)
Potassium: 3.3 mmol/L — ABNORMAL LOW (ref 3.5–5.1)
Sodium: 140 mmol/L (ref 135–145)
Total Bilirubin: 0.8 mg/dL (ref 0.3–1.2)
Total Protein: 6.9 g/dL (ref 6.5–8.1)

## 2018-02-08 MED ORDER — PEGFILGRASTIM-CBQV 6 MG/0.6ML ~~LOC~~ SOSY
6.0000 mg | PREFILLED_SYRINGE | Freq: Once | SUBCUTANEOUS | Status: AC
  Administered 2018-02-08: 6 mg via SUBCUTANEOUS
  Filled 2018-02-08: qty 0.6

## 2018-02-08 MED ORDER — POLYETHYLENE GLYCOL 3350 17 GM/SCOOP PO POWD
17.0000 g | Freq: Two times a day (BID) | ORAL | Status: DC | PRN
  Filled 2018-02-08: qty 255

## 2018-02-08 MED ORDER — MOMETASONE FURO-FORMOTEROL FUM 200-5 MCG/ACT IN AERO
2.0000 | INHALATION_SPRAY | Freq: Two times a day (BID) | RESPIRATORY_TRACT | Status: DC
  Administered 2018-02-08 – 2018-02-10 (×4): 2 via RESPIRATORY_TRACT
  Filled 2018-02-08: qty 8.8

## 2018-02-08 MED ORDER — ALBUTEROL SULFATE (2.5 MG/3ML) 0.083% IN NEBU: 3.0000 mL | INHALATION_SOLUTION | Freq: Four times a day (QID) | RESPIRATORY_TRACT | Status: DC | PRN

## 2018-02-08 MED ORDER — HYDROMORPHONE HCL 1 MG/ML IJ SOLN
1.0000 mg | Freq: Once | INTRAMUSCULAR | Status: AC
  Administered 2018-02-08: 1 mg via INTRAVENOUS
  Filled 2018-02-08: qty 1

## 2018-02-08 MED ORDER — SODIUM CHLORIDE 0.9% FLUSH
10.0000 mL | INTRAVENOUS | Status: DC | PRN
  Administered 2018-02-08: 10 mL via INTRAVENOUS
  Filled 2018-02-08: qty 10

## 2018-02-08 MED ORDER — OXYCODONE ER 13.5 MG PO C12A: 18.0000 mg | EXTENDED_RELEASE_CAPSULE | Freq: Two times a day (BID) | ORAL | Status: DC

## 2018-02-08 MED ORDER — SODIUM CHLORIDE 0.9 % IV SOLN
INTRAVENOUS | Status: DC
  Administered 2018-02-08 – 2018-02-09 (×2): via INTRAVENOUS

## 2018-02-08 MED ORDER — ONDANSETRON HCL 4 MG PO TABS: 4.0000 mg | ORAL_TABLET | Freq: Four times a day (QID) | ORAL | Status: DC | PRN

## 2018-02-08 MED ORDER — DEXAMETHASONE SODIUM PHOSPHATE 10 MG/ML IJ SOLN
20.0000 mg | Freq: Once | INTRAMUSCULAR | Status: AC
  Administered 2018-02-08: 20 mg via INTRAVENOUS
  Filled 2018-02-08: qty 2

## 2018-02-08 MED ORDER — HEPARIN SOD (PORK) LOCK FLUSH 100 UNIT/ML IV SOLN
500.0000 [IU] | Freq: Once | INTRAVENOUS | Status: DC
  Filled 2018-02-08: qty 5

## 2018-02-08 MED ORDER — ENOXAPARIN SODIUM 40 MG/0.4ML ~~LOC~~ SOLN
40.0000 mg | SUBCUTANEOUS | Status: DC
  Administered 2018-02-08 – 2018-02-09 (×2): 40 mg via SUBCUTANEOUS
  Filled 2018-02-08 (×2): qty 0.4

## 2018-02-08 MED ORDER — OXYCODONE HCL ER 20 MG PO T12A
20.0000 mg | EXTENDED_RELEASE_TABLET | Freq: Two times a day (BID) | ORAL | Status: DC
  Administered 2018-02-08 – 2018-02-10 (×4): 20 mg via ORAL
  Filled 2018-02-08 (×4): qty 1

## 2018-02-08 MED ORDER — DRONABINOL 2.5 MG PO CAPS
5.0000 mg | ORAL_CAPSULE | Freq: Two times a day (BID) | ORAL | Status: DC
  Administered 2018-02-08 – 2018-02-10 (×4): 5 mg via ORAL
  Filled 2018-02-08 (×4): qty 2

## 2018-02-08 MED ORDER — ASPIRIN EC 81 MG PO TBEC
81.0000 mg | DELAYED_RELEASE_TABLET | Freq: Every day | ORAL | Status: DC
  Administered 2018-02-08 – 2018-02-10 (×3): 81 mg via ORAL
  Filled 2018-02-08 (×3): qty 1

## 2018-02-08 MED ORDER — ATORVASTATIN CALCIUM 20 MG PO TABS
10.0000 mg | ORAL_TABLET | Freq: Every day | ORAL | Status: DC
  Administered 2018-02-08 – 2018-02-09 (×2): 10 mg via ORAL
  Filled 2018-02-08 (×2): qty 1

## 2018-02-08 MED ORDER — OXYCODONE ER 13.5 MG PO C12A: 13.5000 mg | EXTENDED_RELEASE_CAPSULE | Freq: Two times a day (BID) | ORAL | Status: DC

## 2018-02-08 MED ORDER — TIOTROPIUM BROMIDE MONOHYDRATE 18 MCG IN CAPS
18.0000 ug | ORAL_CAPSULE | Freq: Every day | RESPIRATORY_TRACT | Status: DC
  Administered 2018-02-10: 09:00:00 18 ug via RESPIRATORY_TRACT
  Filled 2018-02-08: qty 5

## 2018-02-08 MED ORDER — ONDANSETRON HCL 4 MG/2ML IJ SOLN: 4.0000 mg | Freq: Four times a day (QID) | INTRAMUSCULAR | Status: DC | PRN

## 2018-02-08 MED ORDER — MORPHINE SULFATE (PF) 2 MG/ML IV SOLN: 2.0000 mg | INTRAVENOUS | Status: DC | PRN

## 2018-02-08 MED ORDER — DEXAMETHASONE 4 MG PO TABS
4.0000 mg | ORAL_TABLET | Freq: Every day | ORAL | Status: DC
  Administered 2018-02-09: 4 mg via ORAL
  Filled 2018-02-08: qty 1

## 2018-02-08 MED ORDER — HYDROCODONE-ACETAMINOPHEN 5-325 MG PO TABS
1.0000 | ORAL_TABLET | Freq: Four times a day (QID) | ORAL | Status: DC | PRN
  Administered 2018-02-08 – 2018-02-09 (×4): 1 via ORAL
  Filled 2018-02-08 (×4): qty 1

## 2018-02-08 MED ORDER — PROCHLORPERAZINE MALEATE 10 MG PO TABS
10.0000 mg | ORAL_TABLET | Freq: Four times a day (QID) | ORAL | Status: DC | PRN
  Filled 2018-02-08: qty 1

## 2018-02-08 MED ORDER — ONDANSETRON HCL 4 MG PO TABS: 8.0000 mg | ORAL_TABLET | Freq: Two times a day (BID) | ORAL | Status: DC | PRN

## 2018-02-08 MED ORDER — SODIUM CHLORIDE 0.9 % IV SOLN
Freq: Once | INTRAVENOUS | Status: AC
  Administered 2018-02-08: 14:00:00 via INTRAVENOUS
  Filled 2018-02-08: qty 250

## 2018-02-08 NOTE — Progress Notes (Signed)
Hematology/Oncology follow up note [facility] Telephone:([phone] Fax:([phone]   Patient Care Team: Tonia Ghent, MD as PCP - General (Family Medicine) Eustace Moore, MD as Consulting Physician (Neurosurgery) Telford Nab, RN as Registered Nurse  REFERRING PROVIDER: Tonia Ghent, MD REASON FOR VISIT:  Follow-up for poorly differentiated neuroendocrine cancer  HISTORY OF PRESENTING ILLNESS:  Joseph Parker is a  77 y.o.  male with PMH listed below who was referred to me for evaluation of bone lytic lesion.   patient  presented to primary care physician for evaluation of right side tongue discomfort, loss of appetite for 2 weeks.  As well as 1 months of right groin pain feels like a pulled muscle.  Patient was treated with 10 days of prednisone as well as gabapentin 100 mg daily. Patient reports that prednisone has significantly helped patient's appetite however he developed thrush while on prednisone.  He was treated with nystatin solution with some improvement.  Persistent abnormal sensation. Patient was referred to ENT for further evaluation.  MRI was scanned for right tongue deviation  Also had CT abdomen pelvis with contrast on 12/28/2017 which showed abdominal lytic bone lesion in the right lesser trochanter, right sacrum, right ilium and T12 vertebral body most concerning for malignancy secondary to multiple myeloma versus metastasis disease. Pathologic avulsion fracture of the right lesser trochanter.   # Case was discussed on tumor board on 01/31/2018, consensus reached to proceed with palliative chemotherapy with palliative RT. # Goal of care was discussed with patient. Patient and family members understand that patient's disease is not curable, goal of treatment is with palliative intent.   INTERVAL HISTORY Joseph Parker is a 77 y.o. male who has above history reviewed by me presents for evaluation of acute onset of right side  neck pain and headache. 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  Today is day 4 of cycle 1. He is also receiving palliative radiation to SI joint/lumbar spine region.  Patient reports right-sided neck pain started suddenly after he got up from radiation table yesterday.  Right neck pain also radiate to right ear and whole head area, and right shoulder.  Patient tried home narcotics with Xtampza 13.5 mgtwice daily plus Norco without relief.  Family called today and patient presented to clinic for evaluation.  Denies any nausea vomiting, dizziness, chest pain, shortness of breath, blurred vision, focal weakness.  Review of Systems  Constitutional: Positive for malaise/fatigue and weight loss. Negative for chills and fever.  HENT: Negative for nosebleeds and sore throat.   Eyes: Negative for double vision, photophobia and redness.  Respiratory: Negative for cough, shortness of breath and wheezing.   Cardiovascular: Negative for chest pain, palpitations, orthopnea and leg swelling.  Gastrointestinal: Negative for abdominal pain, blood in stool, nausea and vomiting.  Genitourinary: Negative for dysuria.  Musculoskeletal: Positive for back pain, joint pain and neck pain. Negative for myalgias.  Skin: Negative for itching and rash.  Neurological: Positive for headaches. Negative for dizziness, tingling and tremors.       Decreased food taste.    Endo/Heme/Allergies: Negative for environmental allergies. Does not bruise/bleed easily.  Psychiatric/Behavioral: Negative for depression and hallucinations.    MEDICAL HISTORY:  Past Medical History:  Diagnosis Date  . Cancer ([place])   . Carotid arterial disease ([place])   . Carotid artery disease ([place])   . Chickenpox   . Colon polyps   .  COPD (chronic obstructive pulmonary disease) ([place])   . Hyperlipidemia   . Neuroendocrine cancer ([place]) 01/28/2018  . Normal  cardiac stress test    06/2013  . Shortness of breath dyspnea   . Urinary frequency   . Urinary hesitancy     SURGICAL HISTORY: Past Surgical History:  Procedure Laterality Date  . CATARACT EXTRACTION, BILATERAL    . COLONOSCOPY    . ESOPHAGOGASTRODUODENOSCOPY    . HERNIA REPAIR     right inquinal - as child  . LUMBAR LAMINECTOMY/DECOMPRESSION MICRODISCECTOMY Left 10/14/2014   Procedure: Left Lumbar three-four extraforaminla microdiskectomy;  Surgeon: Eustace Moore, MD;  Location: [facility];  Service: Neurosurgery;  Laterality: Left;  Left L34 extraforaminal microdiskectomy  . POLYPECTOMY    . PORTA CATH INSERTION N/A 01/31/2018   Procedure: PORTA CATH INSERTION;  Surgeon: Algernon Huxley, MD;  Location: [facility];  Service: Cardiovascular;  Laterality: N/A;  . TONSILLECTOMY     remote - childhood  . UPPER GASTROINTESTINAL ENDOSCOPY      SOCIAL HISTORY: Social History   Socioeconomic History  . Marital status: Married    Spouse name: Not on file  . Number of children: 5  . Years of education: 71  . Highest education level: Not on file  Occupational History  . Occupation: mfg Librarian, academic    Comment: retired  Scientific laboratory technician  . Financial resource strain: Patient refused  . Food insecurity:    Worry: Patient refused    Inability: Patient refused  . Transportation needs:    Medical: Patient refused    Non-medical: Patient refused  Tobacco Use  . Smoking status: Former Smoker    Packs/day: 0.50    Years: 40.00    Pack years: 20.00    Last attempt to quit: 07/20/1999    Years since quitting: 18.5  . Smokeless tobacco: Never Used  Substance and Sexual Activity  . Alcohol use: No    Alcohol/week: 0.0 standard drinks  . Drug use: No  . Sexual activity: Yes    Birth control/protection: Post-menopausal  Lifestyle  . Physical activity:    Days per week: Patient refused    Minutes per session: Patient refused  . Stress: Patient refused  Relationships  . Social  connections:    Talks on phone: Patient refused    Gets together: Patient refused    Attends religious service: Patient refused    Active member of club or organization: Patient refused    Attends meetings of clubs or organizations: Patient refused    Relationship status: Patient refused  . Intimate partner violence:    Fear of current or ex partner: Patient refused    Emotionally abused: Patient refused    Physically abused: Patient refused    Forced sexual activity: Patient refused  Other Topics Concern  . Not on file  Social History Narrative   HSG. Married '63.    Previous marriage - 3 years/divorce. 5 sons. 7 grandchildren.   Work - Financial planner - retired.    Active in community and in his church   [employer] fan    FAMILY HISTORY: Family History  Problem Relation Age of Onset  . Heart disease Mother        CABG; Valve replacement - died post-op  . Alzheimer's disease Father   . Diabetes Father   . Diabetes Son   . Colon cancer Sister   . Prostate cancer Neg Hx   . Colon polyps Neg Hx  ALLERGIES:  is allergic to penicillins and tessalon [benzonatate].  MEDICATIONS:  No current facility-administered medications for this visit.    No current outpatient medications on file.   Facility-Administered Medications Ordered in Other Visits  Medication Dose Route Frequency Provider Last Rate Last Dose  . 0.9 %  sodium chloride infusion   Intravenous Continuous Dustin Flock, MD 100 mL/hr at 02/08/18 1737    . albuterol (PROVENTIL) (2.5 MG/3ML) 0.083% nebulizer solution 3 mL  3 mL Inhalation Q6H PRN Dustin Flock, MD      . aspirin EC tablet 81 mg  81 mg Oral Daily Dustin Flock, MD      . atorvastatin (LIPITOR) tablet 10 mg  10 mg Oral Daily Dustin Flock, MD      . dronabinol (MARINOL) capsule 5 mg  5 mg Oral BID AC Dustin Flock, MD   5 mg at 02/08/18 1742  . enoxaparin (LOVENOX) injection 40 mg  40 mg Subcutaneous Q24H Dustin Flock, MD      .  HYDROcodone-acetaminophen (NORCO/VICODIN) 5-325 MG per tablet 1 tablet  1 tablet Oral Q6H PRN Dustin Flock, MD   1 tablet at 02/08/18 1742  . mometasone-formoterol (DULERA) 200-5 MCG/ACT inhaler 2 puff  2 puff Inhalation BID Dustin Flock, MD      . morphine 2 MG/ML injection 2 mg  2 mg Intravenous Q4H PRN Dustin Flock, MD      . ondansetron (ZOFRAN) tablet 4 mg  4 mg Oral Q6H PRN Dustin Flock, MD       Or  . ondansetron (ZOFRAN) injection 4 mg  4 mg Intravenous Q6H PRN Dustin Flock, MD      . oxyCODONE ER C12A 13.5 mg  13.5 mg Oral Q12H Dustin Flock, MD      . polyethylene glycol powder (GLYCOLAX/MIRALAX) container 17 g  17 g Oral BID PRN Dustin Flock, MD      . prochlorperazine (COMPAZINE) tablet 10 mg  10 mg Oral Q6H PRN Dustin Flock, MD      . tiotropium (SPIRIVA) inhalation capsule ([facility] use ONLY) 18 mcg  18 mcg Inhalation Daily Dustin Flock, MD         PHYSICAL EXAMINATION: ECOG PERFORMANCE STATUS: 1 - Symptomatic but completely ambulatory Vitals:   02/08/18 1040  BP: (!) 156/72  Pulse: 92  Resp: 20  Temp: (!) 96.3 F (35.7 C)  SpO2: 97%   Filed Weights   02/08/18 1040  Weight: 130 lb 12.8 oz (59.3 kg)    Physical Exam Constitutional:      General: He is not in acute distress. HENT:     Head: Normocephalic and atraumatic.     Mouth/Throat:     Comments: Thrush  Eyes:     General: No scleral icterus.    Pupils: Pupils are equal, round, and reactive to light.  Neck:     Musculoskeletal: Normal range of motion and neck supple.     Comments: Palpable tenderness of right neck Cardiovascular:     Rate and Rhythm: Normal rate and regular rhythm.     Heart sounds: Normal heart sounds.  Pulmonary:     Effort: Pulmonary effort is normal. No respiratory distress.     Breath sounds: No wheezing.  Abdominal:     General: Bowel sounds are normal. There is no distension.     Palpations: Abdomen is soft. There is no mass.     Tenderness: There is no  abdominal tenderness.  Musculoskeletal: Normal range of motion.  General: No deformity.  Skin:    General: Skin is warm and dry.     Findings: No erythema or rash.  Neurological:     Mental Status: He is alert and oriented to person, place, and time.     Cranial Nerves: No cranial nerve deficit.     Coordination: Coordination normal.  Psychiatric:        Behavior: Behavior normal.        Thought Content: Thought content normal.      LABORATORY DATA:  I have reviewed the data as listed Lab Results  Component Value Date   WBC 14.1 (H) 02/08/2018   HGB 11.6 (L) 02/08/2018   HCT 34.6 (L) 02/08/2018   MCV 85.0 02/08/2018   PLT 269 02/08/2018   Recent Labs    01/02/18 1606 01/20/18 1252 02/05/18 0858 02/08/18 1130  NA  --  136 140 140  K  --  4.3 3.6 3.3*  CL  --  97* 99 106  CO2  --  '27 30 24  ' GLUCOSE  --  107* 109* 93  BUN  --  '13 17 21  ' CREATININE  --  0.79 1.05 0.73  CALCIUM  --  10.3 11.9* 8.8*  GFRNONAA  --  >60 >60 >60  GFRAA  --  >60 >60 >60  PROT 7.7 7.8 7.7 6.9  ALBUMIN 4.7 4.3 4.1 3.8  AST 21 29 39 39  ALT '10 12 14 14  ' ALKPHOS 90 92 92 90  BILITOT 0.8 1.2 0.8 0.8  BILIDIR 0.1  --   --   --   IBILI 0.7  --   --   --    Iron/TIBC/Ferritin/ %Sat No results found for: IRON, TIBC, FERRITIN, IRONPCTSAT   05/01/2017 Cologuard negative.   RADIOGRAPHIC STUDIES: I have personally reviewed the radiological images as listed and agreed with the findings in the report. 12/28/2017 CT abdomen pelvis w contrast 1. Abnormal lytic bone lesions in the right lesser trochanter, right sacrum, right ilium and T12 vertebral body most concerning for malignancy secondary to multiple myeloma versus metastatic disease. 2. Pathologic avulsion fracture of the right lesser trochanter. 3. Bilateral renal cysts. 4.  Aortic Atherosclerosis (ICD10-I70.0).  ASSESSMENT & PLAN:  1. Neck pain, acute   2. Acute intractable headache, unspecified headache type    #Sudden onset right  neck pain and headache, Unknown etiology, differential includes muscle strain, acute intracranial process, pathological fracture or neoplasm related pain. Patient received IV Dilaudid 1 mg, repeated 3 times in the clinic, also received dexamethasone 20 mg IV x1.Marland Kitchen  Also obtain stat CT cervical spine and head. Image was independent reviewed and discussed with patient. No acute intracranial abnormality.  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  No acute osseous abnormality identified in the cervical spine.  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  Metastatic involving T2 posterior element with the left lateral epidural tumor. Given that he has intractable pain, will directly admit patient to the hospital for pain management.  Discussed with Dr. Posey Pronto.  Plan IV narcotics in conjunction with increase of patient's long-acting oral medication regimen to titrate with the pain.  Need palliative radiation to the skull mass.    # Large cell neuroendocrine carcinoma.  Status post cycle 1 carboplatin/etoposide/Tecentriq. He received Udenyca for G-CSF support today.  # Thrush, improved.  Continue nystatin oral rinse swish and spit three times a day.   # Weight loss and loss  of appetite.  Started on Marinol 44m BID.    # Hypercalcemia, due to malignancy, status post Zometa 4 mg x 1, calcium level improved. We spent sufficient time to discuss many aspect of care, questions were answered to patient's satisfaction. Total face to face encounter time for this patient visit was 40 min. >50% of the time was  spent in counseling and coordination of care.  Disposition: Admission.  Return of visit: If he is out of hospital on Monday, he has appointment on 02/11/2018 repeat labs, and follow up with NP. Possible hydration.   ZEarlie Server MD, PhD Hematology Oncology CSurgcenter Of Bel Airat ACornerstone Hospital Of Bossier CityPager- [phone]/20/2019

## 2018-02-08 NOTE — Progress Notes (Signed)
Pt complains of riight side neck pain that started yesterday after he got up from radiation table. Patient states "my whole head is hurting, running down to right neck and left shoulder blade. I cant lift my head up or down. "  Per wife, no pain before radiation started. Pain medications not helping with pain

## 2018-02-08 NOTE — H&P (Signed)
[facility] [place] NAME: Joseph Parker    MR#:  [id number]  DATE OF BIRTH:  Aug 16, 1940  DATE OF ADMISSION:  02/08/2018  PRIMARY CARE PHYSICIAN: Tonia Ghent, MD   REQUESTING/REFERRING PHYSICIAN:Yu, Talbert Cage, MD   CHIEF COMPLAINT:  No chief complaint on file.   HISTORY OF PRESENT ILLNESS: Joseph Parker  is a 77 y.o. male with a known history of neuroendocrine cancer with metastatic disease got radiation yesterday to his hip and back at that time patient started having pain in his neck and head.  Tums continue to get worse.  He was seen in the oncology clinic and referred for pain control.  We were asked to admit the patient for pain control  PAST MEDICAL HISTORY:   Past Medical History:  Diagnosis Date  . Cancer ([place])   . Carotid arterial disease ([place])   . Carotid artery disease ([place])   . Chickenpox   . Colon polyps   . COPD (chronic obstructive pulmonary disease) ([place])   . Hyperlipidemia   . Neuroendocrine cancer ([place]) 01/28/2018  . Normal cardiac stress test    06/2013  . Shortness of breath dyspnea   . Urinary frequency   . Urinary hesitancy     PAST SURGICAL HISTORY:  Past Surgical History:  Procedure Laterality Date  . CATARACT EXTRACTION, BILATERAL    . COLONOSCOPY    . ESOPHAGOGASTRODUODENOSCOPY    . HERNIA REPAIR     right inquinal - as child  . LUMBAR LAMINECTOMY/DECOMPRESSION MICRODISCECTOMY Left 10/14/2014   Procedure: Left Lumbar three-four extraforaminla microdiskectomy;  Surgeon: Eustace Moore, MD;  Location: [facility];  Service: Neurosurgery;  Laterality: Left;  Left L34 extraforaminal microdiskectomy  . POLYPECTOMY    . PORTA CATH INSERTION N/A 01/31/2018   Procedure: PORTA CATH INSERTION;  Surgeon: Algernon Huxley, MD;  Location: [facility];  Service: Cardiovascular;  Laterality: N/A;  . TONSILLECTOMY     remote - childhood  . UPPER GASTROINTESTINAL ENDOSCOPY      SOCIAL HISTORY:  Social  History   Tobacco Use  . Smoking status: Former Smoker    Packs/day: 0.50    Years: 40.00    Pack years: 20.00    Last attempt to quit: 07/20/1999    Years since quitting: 18.5  . Smokeless tobacco: Never Used  Substance Use Topics  . Alcohol use: No    Alcohol/week: 0.0 standard drinks    FAMILY HISTORY:  Family History  Problem Relation Age of Onset  . Heart disease Mother        CABG; Valve replacement - died post-op  . Alzheimer's disease Father   . Diabetes Father   . Diabetes Son   . Colon cancer Sister   . Prostate cancer Neg Hx   . Colon polyps Neg Hx     DRUG ALLERGIES:  Allergies  Allergen Reactions  . Penicillins Itching, Swelling and Rash  . Tessalon [Benzonatate] Swelling    Had wide-spread numbness, throat swelling    REVIEW OF SYSTEMS:   CONSTITUTIONAL: No fever, fatigue or weakness.  EYES: No blurred or double vision.  EARS, NOSE, AND THROAT: No tinnitus or ear pain.  RESPIRATORY: No cough, shortness of breath, wheezing or hemoptysis.  CARDIOVASCULAR: No chest pain, orthopnea, edema.  GASTROINTESTINAL: No nausea, vomiting, diarrhea or abdominal pain.  GENITOURINARY: No dysuria, hematuria.  ENDOCRINE: No polyuria, nocturia,  HEMATOLOGY: No anemia, easy bruising or bleeding SKIN: No rash or lesion.  MUSCULOSKELETAL: Positive neck pain and headache NEUROLOGIC: No tingling, numbness, weakness.  PSYCHIATRY: No anxiety or depression.   MEDICATIONS AT HOME:  Prior to Admission medications   Medication Sig Start Date End Date Taking? Authorizing Provider  albuterol (PROVENTIL HFA;VENTOLIN HFA) 108 (90 Base) MCG/ACT inhaler Inhale 2 puffs into the lungs every 6 (six) hours as needed for wheezing or shortness of breath. 04/19/17   Tonia Ghent, MD  aspirin EC 81 MG tablet Take 1 tablet (81 mg total) by mouth daily. 06/11/13   Belva Crome, MD  atorvastatin (LIPITOR) 10 MG tablet Take 1 tablet (10 mg total) by mouth daily. 04/25/17   Tonia Ghent, MD   dronabinol (MARINOL) 5 MG capsule Take 1 capsule (5 mg total) by mouth 2 (two) times daily before a meal. 02/05/18   Earlie Server, MD  Fluticasone-Salmeterol (ADVAIR DISKUS) 250-50 MCG/DOSE AEPB Inhale 1 puff into the lungs 2 (two) times daily. 04/19/17   Tonia Ghent, MD  HYDROcodone-acetaminophen (NORCO) 5-325 MG tablet Take 1 tablet by mouth every 6 (six) hours as needed for severe pain. 01/23/18   Earlie Server, MD  lidocaine-prilocaine (EMLA) cream Apply to affected area once 01/28/18   Earlie Server, MD  ondansetron (ZOFRAN) 8 MG tablet Take 1 tablet (8 mg total) by mouth 2 (two) times daily as needed for refractory nausea / vomiting. Start on day 3 after carboplatin chemo. 01/28/18   Earlie Server, MD  oxyCODONE ER [facility] ER) 13.5 MG C12A Take 13.5 mg by mouth every 12 (twelve) hours. 01/28/18   Earlie Server, MD  polyethylene glycol powder (GLYCOLAX/MIRALAX) powder Take 17 g by mouth 2 (two) times daily as needed. 01/04/18   Tonia Ghent, MD  prochlorperazine (COMPAZINE) 10 MG tablet Take 1 tablet (10 mg total) by mouth every 6 (six) hours as needed (Nausea or vomiting). 01/28/18   Earlie Server, MD  tiotropium (SPIRIVA) 18 MCG inhalation capsule Place 1 capsule (18 mcg total) into inhaler and inhale daily. Patient taking differently: Place 18 mcg into inhaler and inhale daily as needed.  03/18/17   Tonia Ghent, MD      PHYSICAL EXAMINATION:   VITAL SIGNS: Blood pressure 138/73, pulse 81, temperature 97.6 F (36.4 C), temperature source Oral, resp. rate 18, height 5' 7.5" (1.715 m), weight 57.3 kg, SpO2 93 %.  GENERAL:  77 y.o.-year-old patient lying in the bed with no acute distress.  EYES: Pupils equal, round, reactive to light and accommodation. No scleral icterus. Extraocular muscles intact.  HEENT: Head atraumatic, normocephalic. Oropharynx and nasopharynx clear.  NECK:  Supple, no jugular venous distention. No thyroid enlargement, no tenderness.  LUNGS: Normal breath sounds bilaterally, no wheezing,  rales,rhonchi or crepitation. No use of accessory muscles of respiration.  CARDIOVASCULAR: S1, S2 normal. No murmurs, rubs, or gallops.  ABDOMEN: Soft, nontender, nondistended. Bowel sounds present. No organomegaly or mass.  EXTREMITIES: No pedal edema, cyanosis, or clubbing.  NEUROLOGIC: Cranial nerves II through XII are intact. Muscle strength 5/5 in all extremities. Sensation intact. Gait not checked.  PSYCHIATRIC: The patient is alert and oriented x 3.  SKIN: No obvious rash, lesion, or ulcer.   LABORATORY PANEL:   CBC Recent Labs  Lab 02/05/18 0858 02/08/18 1130  WBC 10.3 14.1*  HGB 12.0* 11.6*  HCT 35.2* 34.6*  PLT 244 269  MCV 85.2 85.0  MCH 29.1 28.5  MCHC 34.1 33.5  RDW 13.1 13.2  LYMPHSABS 1.4 0.8  MONOABS 1.3* 0.2  EOSABS 0.2 0.0  BASOSABS 0.0 0.0   ------------------------------------------------------------------------------------------------------------------  Chemistries  Recent Labs  Lab 02/05/18 0858 02/08/18 1130  NA 140 140  K 3.6 3.3*  CL 99 106  CO2 30 24  GLUCOSE 109* 93  BUN 17 21  CREATININE 1.05 0.73  CALCIUM 11.9* 8.8*  AST 39 39  ALT 14 14  ALKPHOS 92 90  BILITOT 0.8 0.8   ------------------------------------------------------------------------------------------------------------------ estimated creatinine clearance is 61.7 mL/min (by C-G formula based on SCr of 0.73 mg/dL). ------------------------------------------------------------------------------------------------------------------ No results for input(s): TSH, T4TOTAL, T3FREE, THYROIDAB in the last 72 hours.  Invalid input(s): FREET3   Coagulation profile No results for input(s): INR, PROTIME in the last 168 hours. ------------------------------------------------------------------------------------------------------------------- No results for input(s): DDIMER in the last 72  hours. -------------------------------------------------------------------------------------------------------------------  Cardiac Enzymes No results for input(s): CKMB, TROPONINI, MYOGLOBIN in the last 168 hours.  Invalid input(s): CK ------------------------------------------------------------------------------------------------------------------ Invalid input(s): POCBNP  ---------------------------------------------------------------------------------------------------------------  Urinalysis    Component Value Date/Time   BILIRUBINUR negative 10/28/2014 1413   PROTEINUR negative 10/28/2014 1413   UROBILINOGEN 0.2 10/28/2014 1413   NITRITE negative 10/28/2014 1413   LEUKOCYTESUR small (1+) (A) 10/28/2014 1413     RADIOLOGY: Ct Head Wo Contrast  Result Date: 02/08/2018 CLINICAL DATA:  Acute right neck pain and headache beginning after radiation therapy yesterday. History of poorly differentiated neuroendocrine cancer. EXAM: CT HEAD WITHOUT CONTRAST CT CERVICAL SPINE WITHOUT CONTRAST TECHNIQUE: Multidetector CT imaging of the head and cervical spine was performed following the standard protocol without intravenous contrast. Multiplanar CT image reconstructions of the cervical spine were also generated. COMPARISON:  Brain MRI 01/09/2018.  PET-CT 01/11/2018. FINDINGS: CT HEAD FINDINGS Brain: There is no evidence of acute infarct, intracranial hemorrhage, mass, midline shift, or extra-axial fluid collection. The ventricles and sulci are within normal limits for age. Scattered cerebral white matter hypodensities are nonspecific but compatible with mild chronic small vessel ischemic disease. Vascular: Calcified atherosclerosis at the skull base. No hyperdense vessel. Skull: 3.5 cm destructive skull base lesion involving the right occipital condyle, petrous ridge, and clivus, increased in size from the prior MRI. Scattered subcentimeter lesions elsewhere in the skull. Sinuses/Orbits:  Visualized paranasal sinuses and mastoid air cells are clear. Bilateral cataract extraction is noted. Other: None. CT CERVICAL SPINE FINDINGS Alignment: Slight right convex curvature of the cervical spine. No significant listhesis. Skull base and vertebrae: Right-sided skull base lesion as above. 4 cm destructive mass involving the left-sided posterior elements at T2. 1 cm lytic lesion in the C7 vertebral body. No acute fracture Soft tissues and spinal canal: Left-sided epidural tumor at T2 without evidence of high-grade spinal stenosis. No prevertebral swelling. Disc levels: Moderate disc space narrowing and degenerative endplate spurring from E4-2 to C7-T1 with moderate to severe multilevel neural foraminal stenosis. Upper chest: Centrilobular emphysema in the lung apices. Other: Carotid artery atherosclerosis. IMPRESSION: 1. No evidence of acute intracranial abnormality. 2. Osseous metastatic disease with significant interval enlargement of a destructive right skull base mass. 3. No acute osseous abnormality identified in the cervical spine. 4. 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 5. Metastasis involving the T2 posterior elements with left lateral epidural tumor. Electronically Signed   By: Logan Bores M.D.   On: 02/08/2018 13:09   Ct Cervical Spine Wo Contrast  Result Date: 02/08/2018 CLINICAL DATA:  Acute right neck pain and headache beginning after radiation therapy yesterday. History of poorly differentiated neuroendocrine cancer. EXAM: CT HEAD WITHOUT CONTRAST CT CERVICAL SPINE WITHOUT CONTRAST TECHNIQUE: Multidetector CT imaging of the  head and cervical spine was performed following the standard protocol without intravenous contrast. Multiplanar CT image reconstructions of the cervical spine were also generated. COMPARISON:  Brain MRI 01/09/2018.  PET-CT 01/11/2018. FINDINGS: CT HEAD FINDINGS Brain: There is no evidence of acute infarct, intracranial  hemorrhage, mass, midline shift, or extra-axial fluid collection. The ventricles and sulci are within normal limits for age. Scattered cerebral white matter hypodensities are nonspecific but compatible with mild chronic small vessel ischemic disease. Vascular: Calcified atherosclerosis at the skull base. No hyperdense vessel. Skull: 3.5 cm destructive skull base lesion involving the right occipital condyle, petrous ridge, and clivus, increased in size from the prior MRI. Scattered subcentimeter lesions elsewhere in the skull. Sinuses/Orbits: Visualized paranasal sinuses and mastoid air cells are clear. Bilateral cataract extraction is noted. Other: None. CT CERVICAL SPINE FINDINGS Alignment: Slight right convex curvature of the cervical spine. No significant listhesis. Skull base and vertebrae: Right-sided skull base lesion as above. 4 cm destructive mass involving the left-sided posterior elements at T2. 1 cm lytic lesion in the C7 vertebral body. No acute fracture Soft tissues and spinal canal: Left-sided epidural tumor at T2 without evidence of high-grade spinal stenosis. No prevertebral swelling. Disc levels: Moderate disc space narrowing and degenerative endplate spurring from N9-8 to C7-T1 with moderate to severe multilevel neural foraminal stenosis. Upper chest: Centrilobular emphysema in the lung apices. Other: Carotid artery atherosclerosis. IMPRESSION: 1. No evidence of acute intracranial abnormality. 2. Osseous metastatic disease with significant interval enlargement of a destructive right skull base mass. 3. No acute osseous abnormality identified in the cervical spine. 4. 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 5. Metastasis involving the T2 posterior elements with left lateral epidural tumor. Electronically Signed   By: Logan Bores M.D.   On: 02/08/2018 13:09    EKG: Orders placed or performed during the hospital encounter of 01/20/18  . EKG  12-Lead  . EKG 12-Lead  . EKG    IMPRESSION AND PLAN: Patient is a 77 year old with neuroendocrine tumor with mets  1.  Neck pain and headache related to mass in the neck Dr. Tasia Catchings will talk to radiation oncology patient will need radiation treatment to that area.  2.  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  COPD continue inhalers will add nebs as needed  5.  Hyperlipidemia continue Lipitor therapy  All the records are reviewed and case discussed with ED provider. Management plans discussed with the patient, family and they are in agreement.  CODE STATUS: Code Status History    Date Active Date Inactive Code Status Order ID Comments User Context   01/09/2016 2241 01/11/2016 1917 Full Code [id number]  Lance Coon, MD Inpatient   10/14/2014 1524 10/15/2014 1651 Full Code [id number]  Eustace Moore, MD Inpatient       TOTAL TIME TAKING CARE OF THIS PATIENT: 55 minutes.    Dustin Flock M.D on 02/08/2018 at 4:15 PM  Between 7am to 6pm - Pager - [phone]  After 6pm go to [email or web address] - password [email or web address]  [facility]  [phone]  CC: Primary care physician; Tonia Ghent, MD

## 2018-02-08 NOTE — Telephone Encounter (Signed)
Wife called reporting that after his radiation therapy yesterday when he got off the table, the right side of his head into his ear and down his neck was severely painful, it eased off when he got into the recliner in chemotherapy, then when he got up to go home, it began hurting again. She is giving him his pain medications, but they are not effective with this pain. "He is in too much pain to have his treatment today" She mentioned that she may need to call ambulance to get him anywhere, then she said that he is getting up to go to the bathroom and that she can get him into the car, but would need a wheelchair when they get him here. Appointment accepted for [facility] at 1030 this morning. I discussed patient pain the the fact that patient is having pain in area where MRI shows aggressive bone mets. She states he is having his pelvis radiated and that she will discuss this with Dr Baruch Gouty on Monday when he returns   IMPRESSION: 1. Aggressive bone lesion centered in the right occipital condyle with hypoglossal canal involvement and right tongue denervation. Based on abdominal CT 12/28/2017 this is most likely a metastatic focus. 2. No evidence of brain metastasis.   Electronically Signed   By: Monte Fantasia M.D.   On: 01/09/2018 14:46

## 2018-02-09 DIAGNOSIS — Z87891 Personal history of nicotine dependence: Secondary | ICD-10-CM

## 2018-02-09 DIAGNOSIS — C7A8 Other malignant neuroendocrine tumors: Secondary | ICD-10-CM

## 2018-02-09 DIAGNOSIS — G893 Neoplasm related pain (acute) (chronic): Principal | ICD-10-CM

## 2018-02-09 DIAGNOSIS — C7951 Secondary malignant neoplasm of bone: Secondary | ICD-10-CM

## 2018-02-09 MED ORDER — KETOROLAC TROMETHAMINE 15 MG/ML IJ SOLN
7.5000 mg | Freq: Four times a day (QID) | INTRAMUSCULAR | Status: DC
  Administered 2018-02-09 (×2): 7.5 mg via INTRAVENOUS
  Filled 2018-02-09 (×5): qty 1

## 2018-02-09 MED ORDER — AMITRIPTYLINE HCL 25 MG PO TABS
50.0000 mg | ORAL_TABLET | Freq: Every day | ORAL | Status: DC
  Administered 2018-02-09: 50 mg via ORAL
  Filled 2018-02-09: qty 2

## 2018-02-09 MED ORDER — GABAPENTIN 100 MG PO CAPS
100.0000 mg | ORAL_CAPSULE | Freq: Every day | ORAL | Status: DC
  Administered 2018-02-09: 21:00:00 100 mg via ORAL
  Filled 2018-02-09: qty 1

## 2018-02-09 MED ORDER — DEXAMETHASONE SODIUM PHOSPHATE 10 MG/ML IJ SOLN: 5.0000 mg | Freq: Four times a day (QID) | INTRAMUSCULAR | Status: DC

## 2018-02-09 MED ORDER — POTASSIUM CHLORIDE 20 MEQ PO PACK
40.0000 meq | PACK | Freq: Once | ORAL | Status: AC
  Administered 2018-02-09: 40 meq via ORAL
  Filled 2018-02-09: qty 2

## 2018-02-09 MED ORDER — DEXAMETHASONE SODIUM PHOSPHATE 10 MG/ML IJ SOLN
5.0000 mg | Freq: Three times a day (TID) | INTRAMUSCULAR | Status: DC
  Administered 2018-02-09 – 2018-02-10 (×3): 5 mg via INTRAVENOUS
  Filled 2018-02-09 (×3): qty 1

## 2018-02-09 NOTE — Plan of Care (Signed)
  Problem: Pain Managment: Goal: General experience of comfort will improve Outcome: Progressing   Problem: Safety: Goal: Ability to remain free from injury will improve Outcome: Progressing   

## 2018-02-09 NOTE — Progress Notes (Signed)
[facility] NAME: Joseph Parker    MR#:  [id number]  DATE OF BIRTH:  10/23/1940  SUBJECTIVE:  Patient's pain is improved from admission, wife and son are at the bedside, per oncology and family-expecting radiation to skull base/CT C-spine lesions-most likely not able to be done until Monday, add atypical agents for pain, discontinue statin therapy, change Decadron IV  REVIEW OF SYSTEMS:  CONSTITUTIONAL: No fever, fatigue or weakness.  EYES: No blurred or double vision.  EARS, NOSE, AND THROAT: No tinnitus or ear pain.  RESPIRATORY: No cough, shortness of breath, wheezing or hemoptysis.  CARDIOVASCULAR: No chest pain, orthopnea, edema.  GASTROINTESTINAL: No nausea, vomiting, diarrhea or abdominal pain.  GENITOURINARY: No dysuria, hematuria.  ENDOCRINE: No polyuria, nocturia,  HEMATOLOGY: No anemia, easy bruising or bleeding SKIN: No rash or lesion. MUSCULOSKELETAL: No joint pain or arthritis.   NEUROLOGIC: No tingling, numbness, weakness.  PSYCHIATRY: No anxiety or depression.   ROS  DRUG ALLERGIES:   Allergies  Allergen Reactions  . Penicillins Itching, Swelling and Rash  . Tessalon [Benzonatate] Swelling    Had wide-spread numbness, throat swelling    VITALS:  Blood pressure (!) 160/75, pulse 85, temperature (!) 97.5 F (36.4 C), temperature source Oral, resp. rate 14, height 5' 7.5" (1.715 m), weight 57.3 kg, SpO2 90 %.  PHYSICAL EXAMINATION:  GENERAL:  77 y.o.-year-old patient lying in the bed with no acute distress.  EYES: Pupils equal, round, reactive to light and accommodation. No scleral icterus. Extraocular muscles intact.  HEENT: Head atraumatic, normocephalic. Oropharynx and nasopharynx clear.  NECK:  Supple, no jugular venous distention. No thyroid enlargement, no tenderness.  LUNGS: Normal breath sounds bilaterally, no wheezing, rales,rhonchi or crepitation. No use of accessory muscles of respiration.   CARDIOVASCULAR: S1, S2 normal. No murmurs, rubs, or gallops.  ABDOMEN: Soft, nontender, nondistended. Bowel sounds present. No organomegaly or mass.  EXTREMITIES: No pedal edema, cyanosis, or clubbing.  NEUROLOGIC: Cranial nerves II through XII are intact. Muscle strength 5/5 in all extremities. Sensation intact. Gait not checked.  PSYCHIATRIC: The patient is alert and oriented x 3.  SKIN: No obvious rash, lesion, or ulcer.   Physical Exam LABORATORY PANEL:   CBC Recent Labs  Lab 02/08/18 1130  WBC 14.1*  HGB 11.6*  HCT 34.6*  PLT 269   ------------------------------------------------------------------------------------------------------------------  Chemistries  Recent Labs  Lab 02/08/18 1130  NA 140  K 3.3*  CL 106  CO2 24  GLUCOSE 93  BUN 21  CREATININE 0.73  CALCIUM 8.8*  AST 39  ALT 14  ALKPHOS 90  BILITOT 0.8   ------------------------------------------------------------------------------------------------------------------  Cardiac Enzymes No results for input(s): TROPONINI in the last 168 hours. ------------------------------------------------------------------------------------------------------------------  RADIOLOGY:  Ct Head Wo Contrast  Result Date: 02/08/2018 CLINICAL DATA:  Acute right neck pain and headache beginning after radiation therapy yesterday. History of poorly differentiated neuroendocrine cancer. EXAM: CT HEAD WITHOUT CONTRAST CT CERVICAL SPINE WITHOUT CONTRAST TECHNIQUE: Multidetector CT imaging of the head and cervical spine was performed following the standard protocol without intravenous contrast. Multiplanar CT image reconstructions of the cervical spine were also generated. COMPARISON:  Brain MRI 01/09/2018.  PET-CT 01/11/2018. FINDINGS: CT HEAD FINDINGS Brain: There is no evidence of acute infarct, intracranial hemorrhage, mass, midline shift, or extra-axial fluid collection. The ventricles and sulci are within normal limits for age.  Scattered cerebral white matter hypodensities are nonspecific but compatible with mild chronic small vessel ischemic disease. Vascular: Calcified atherosclerosis at the skull  base. No hyperdense vessel. Skull: 3.5 cm destructive skull base lesion involving the right occipital condyle, petrous ridge, and clivus, increased in size from the prior MRI. Scattered subcentimeter lesions elsewhere in the skull. Sinuses/Orbits: Visualized paranasal sinuses and mastoid air cells are clear. Bilateral cataract extraction is noted. Other: None. CT CERVICAL SPINE FINDINGS Alignment: Slight right convex curvature of the cervical spine. No significant listhesis. Skull base and vertebrae: Right-sided skull base lesion as above. 4 cm destructive mass involving the left-sided posterior elements at T2. 1 cm lytic lesion in the C7 vertebral body. No acute fracture Soft tissues and spinal canal: Left-sided epidural tumor at T2 without evidence of high-grade spinal stenosis. No prevertebral swelling. Disc levels: Moderate disc space narrowing and degenerative endplate spurring from M0-8 to C7-T1 with moderate to severe multilevel neural foraminal stenosis. Upper chest: Centrilobular emphysema in the lung apices. Other: Carotid artery atherosclerosis. IMPRESSION: 1. No evidence of acute intracranial abnormality. 2. Osseous metastatic disease with significant interval enlargement of a destructive right skull base mass. 3. No acute osseous abnormality identified in the cervical spine. 4. 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 5. Metastasis involving the T2 posterior elements with left lateral epidural tumor. Electronically Signed   By: Logan Bores M.D.   On: 02/08/2018 13:09   Ct Cervical Spine Wo Contrast  Result Date: 02/08/2018 CLINICAL DATA:  Acute right neck pain and headache beginning after radiation therapy yesterday. History of poorly differentiated neuroendocrine cancer. EXAM:  CT HEAD WITHOUT CONTRAST CT CERVICAL SPINE WITHOUT CONTRAST TECHNIQUE: Multidetector CT imaging of the head and cervical spine was performed following the standard protocol without intravenous contrast. Multiplanar CT image reconstructions of the cervical spine were also generated. COMPARISON:  Brain MRI 01/09/2018.  PET-CT 01/11/2018. FINDINGS: CT HEAD FINDINGS Brain: There is no evidence of acute infarct, intracranial hemorrhage, mass, midline shift, or extra-axial fluid collection. The ventricles and sulci are within normal limits for age. Scattered cerebral white matter hypodensities are nonspecific but compatible with mild chronic small vessel ischemic disease. Vascular: Calcified atherosclerosis at the skull base. No hyperdense vessel. Skull: 3.5 cm destructive skull base lesion involving the right occipital condyle, petrous ridge, and clivus, increased in size from the prior MRI. Scattered subcentimeter lesions elsewhere in the skull. Sinuses/Orbits: Visualized paranasal sinuses and mastoid air cells are clear. Bilateral cataract extraction is noted. Other: None. CT CERVICAL SPINE FINDINGS Alignment: Slight right convex curvature of the cervical spine. No significant listhesis. Skull base and vertebrae: Right-sided skull base lesion as above. 4 cm destructive mass involving the left-sided posterior elements at T2. 1 cm lytic lesion in the C7 vertebral body. No acute fracture Soft tissues and spinal canal: Left-sided epidural tumor at T2 without evidence of high-grade spinal stenosis. No prevertebral swelling. Disc levels: Moderate disc space narrowing and degenerative endplate spurring from Q7-6 to C7-T1 with moderate to severe multilevel neural foraminal stenosis. Upper chest: Centrilobular emphysema in the lung apices. Other: Carotid artery atherosclerosis. IMPRESSION: 1. No evidence of acute intracranial abnormality. 2. Osseous metastatic disease with significant interval enlargement of a destructive right  skull base mass. 3. No acute osseous abnormality identified in the cervical spine. 4. 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 5. Metastasis involving the T2 posterior elements with left lateral epidural tumor. Electronically Signed   By: Logan Bores M.D.   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 Management plans discussed with the patient, family and they are in agreement.  CODE STATUS: full  TOTAL TIME TAKING CARE OF THIS PATIENT: 35 minutes.     POSSIBLE D/C IN 2-3 DAYS, DEPENDING ON CLINICAL CONDITION.   Avel Peace Ayaat Jansma M.D on 02/09/2018   Between 7am to 6pm - Pager - [phone]  After 6pm go to [email or web address] - password [email or web address] [facility]  Office  [phone]  CC: Primary care physician; Tonia Ghent, MD  Note: This dictation was prepared with Dragon dictation along with smaller phrase technology. Any transcriptional errors that result from this process are unintentional.

## 2018-02-09 NOTE — Consult Note (Signed)
[facility]  Date of admission:  02/08/2018  Inpatient day:  02/09/2018  Consulting physician:  Dr. Dustin Flock  Reason for Consultation:  Neck mass  Chief Complaint: Joseph Parker is a 77 y.o. male with stage IV large cell neuroendocrine carcinoma who was admitted with intractable right sided head and neck pain.  HPI:  The patient was diagnosed with stage IV neuroendocrine carcinoma.  PET scan on 01/11/2018 revealed a 5.4 cm LUL mass with mediastinal invasion, numerous osseous metastatic lesions, and a left upper thoracic paraspinal metastatic lesion.  Disease involved right occipital condyle; cervical, thoracic,and lumbar spine; bilateral scapula; right sternal body; multiple ribs; bilateral iliac bones; right sacrum; the left ischium; and both proximal femurs.  Head MRI on 01/09/2018 revealed an aggressive bone lesion centered in the right occipital condyle with hypoglossal canal involvement and right tongue denervation.  There was no brain metastasis.  The patient was seen by Dr Baruch Gouty on 01/28/2018. Plan was for 3000 cGy in 10 fractions to the pelvis. He has received 3 fractions to date.  He began cycle #1 carboplatin, etoposide, and Tecentriq on 02/05/2018 followed by Neulasta on 02/08/2018.  His wife noted increasing pain right base of skull with radiation into neck on Thursday, 02/07/2018, after chemotherapy.  He was seen by Dr. Tasia Catchings on 02/08/2018.  Pain was poorly controlled with Xtampza 13.5 mg BID (extended release oxycodone) + Norco prn.  Head and neck CT without contrast on 02/08/2018 revealed no evidence of acute intracranial abnormality.  There was an osseous metastatic disease with significant interval enlargement (3.5 cm) of a destructive right skull base mass.  There was no acute osseous abnormality identified in the cervical spine.  There was cervical disc degeneration with moderate to severe multilevel neural foraminal stenosis.  There was  metastasis involving the T2 posterior elements with left lateral epidural tumor.  He received Dilaudid 1 mg x 3 and Decadron 20 mg x 1.  Since admission, he has received Decadron 5 mg IV q 8 hours, Norco 5/325 1 q 6 hours prn, Toradol 7.5 mg IV q 6 hours, morphine 2 mg IV q 4 hours prn, and Oxycontin 20 mg po q 12 hours.  He states that he is trying only to take oral pain medications.  His pain level has improved.  He states that it fluctuated between level 5 and 8.  He is able to sleep/rest.   Past Medical History:  Diagnosis Date  . Cancer ([place])   . Carotid arterial disease ([place])   . Carotid artery disease ([place])   . Chickenpox   . Colon polyps   . COPD (chronic obstructive pulmonary disease) ([place])   . Hyperlipidemia   . Neuroendocrine cancer ([place]) 01/28/2018  . Normal cardiac stress test    06/2013  . Shortness of breath dyspnea   . Urinary frequency   . Urinary hesitancy     Past Surgical History:  Procedure Laterality Date  . CATARACT EXTRACTION, BILATERAL    . COLONOSCOPY    . ESOPHAGOGASTRODUODENOSCOPY    . HERNIA REPAIR     right inquinal - as child  . LUMBAR LAMINECTOMY/DECOMPRESSION MICRODISCECTOMY Left 10/14/2014   Procedure: Left Lumbar three-four extraforaminla microdiskectomy;  Surgeon: Eustace Moore, MD;  Location: [facility];  Service: Neurosurgery;  Laterality: Left;  Left L34 extraforaminal microdiskectomy  . POLYPECTOMY    . PORTA CATH INSERTION N/A 01/31/2018   Procedure: PORTA CATH INSERTION;  Surgeon: Algernon Huxley, MD;  Location: [place]  [facility];  Service: Cardiovascular;  Laterality: N/A;  . TONSILLECTOMY     remote - childhood  . UPPER GASTROINTESTINAL ENDOSCOPY      Family History  Problem Relation Age of Onset  . Heart disease Mother        CABG; Valve replacement - died post-op  . Alzheimer's disease Father   . Diabetes Father   . Diabetes Son   . Colon cancer Sister   . Prostate cancer Neg Hx   . Colon polyps Neg Hx     Social  History:  reports that he quit smoking about 18 years ago. He has a 20.00 pack-year smoking history. He has never used smokeless tobacco. He reports that he does not drink alcohol or use drugs.  He is accompanied by his wife.  Allergies:  Allergies  Allergen Reactions  . Penicillins Itching, Swelling and Rash  . Tessalon [Benzonatate] Swelling    Had wide-spread numbness, throat swelling    Medications Prior to Admission  Medication Sig Dispense Refill  . albuterol (PROVENTIL HFA;VENTOLIN HFA) 108 (90 Base) MCG/ACT inhaler Inhale 2 puffs into the lungs every 6 (six) hours as needed for wheezing or shortness of breath. 1 Inhaler 12  . aspirin EC 81 MG tablet Take 1 tablet (81 mg total) by mouth daily.    Marland Kitchen atorvastatin (LIPITOR) 10 MG tablet Take 1 tablet (10 mg total) by mouth daily. 90 tablet 3  . dronabinol (MARINOL) 5 MG capsule Take 1 capsule (5 mg total) by mouth 2 (two) times daily before a meal. 60 capsule 0  . Fluticasone-Salmeterol (ADVAIR DISKUS) 250-50 MCG/DOSE AEPB Inhale 1 puff into the lungs 2 (two) times daily. 60 each prn  . HYDROcodone-acetaminophen (NORCO) 5-325 MG tablet Take 1 tablet by mouth every 6 (six) hours as needed for severe pain. 30 tablet 0  . lidocaine-prilocaine (EMLA) cream Apply to affected area once 30 g 3  . ondansetron (ZOFRAN) 8 MG tablet Take 1 tablet (8 mg total) by mouth 2 (two) times daily as needed for refractory nausea / vomiting. Start on day 3 after carboplatin chemo. 30 tablet 1  . oxyCODONE ER (XTAMPZA ER) 13.5 MG C12A Take 13.5 mg by mouth every 12 (twelve) hours. 30 each 0  . polyethylene glycol powder (GLYCOLAX/MIRALAX) powder Take 17 g by mouth 2 (two) times daily as needed.    . prochlorperazine (COMPAZINE) 10 MG tablet Take 1 tablet (10 mg total) by mouth every 6 (six) hours as needed (Nausea or vomiting). 30 tablet 1  . tiotropium (SPIRIVA) 18 MCG inhalation capsule Place 1 capsule (18 mcg total) into inhaler and inhale daily. (Patient  taking differently: Place 18 mcg into inhaler and inhale daily as needed. ) 30 capsule 12    Review of Systems: GENERAL:  Fatigue.  No fevers, sweats.  Weight loss. PERFORMANCE STATUS (ECOG):  1-2 HEENT:  Thrush, resolving.  No visual changes, runny nose, sore throat, mouth sores or tenderness. Lungs: No shortness of breath or cough.  No hemoptysis. Cardiac:  No chest pain, palpitations, orthopnea, or PND. GI:  Appetite poor.  No nausea, vomiting, diarrhea, constipation, melena or hematochezia. GU:  No urgency, frequency, dysuria, or hematuria. Musculoskeletal:  Base of right skull pain with radiation into neck.  Hip pain improved s/p XRT.  No muscle tenderness. Extremities:  No pain or swelling. Skin:  No rashes or skin changes. Neuro:  Right sided headache.  Right sided tongue deviation.  No numbness or weakness, balance or coordination issues. Endocrine:  No diabetes, thyroid issues, hot flashes or night sweats. Psych:  No mood changes, depression or anxiety. Pain:  Base of right skull pain (fluctuates level 5 to 8). Review of systems:  All other systems reviewed and found to be negative.  Physical Exam:  Blood pressure (!) 160/75, pulse 85, temperature (!) 97.5 F (36.4 C), temperature source Oral, resp. rate 14, height 5' 7.5" (1.715 m), weight 126 lb 6.4 oz (57.3 kg), SpO2 90 %.  GENERAL:  Thin gentleman lying comfortably on the medical unit in no acute distress. MENTAL STATUS:  Alert and oriented to person, place and time. HEAD:  Pearline Cables hair.  Normocephalic, atraumatic, face symmetric, no Cushingoid features. EYES:  Blue eyes.  Pupils equal round and reactive to light and accomodation.  No conjunctivitis or scleral icterus. ENT:  Oropharynx clear without lesion.  No thrush.  Tongue deviated to right.  Mucous membranes dry.  RESPIRATORY:  Clear to auscultation anteriorly without rales, wheezes or rhonchi. CARDIOVASCULAR:  Regular rate and rhythm without murmur, rub or  gallop. ABDOMEN:  Soft, non-tender, with active bowel sounds, and no hepatosplenomegaly.  No masses. SKIN:  No rashes, ulcers or lesions. EXTREMITIES: No edema, no skin discoloration or tenderness.  No palpable cords. LYMPH NODES: No palpable cervical, supraclavicular, axillary or inguinal adenopathy  NEUROLOGICAL: Tongue deviated (right hypoglossal nerve involvement).  Moves all 4 extremities. PSYCH:  Appropriate.   Results for orders placed or performed in visit on 02/08/18 (from the past 48 hour(s))  Comprehensive metabolic panel     Status: Abnormal   Collection Time: 02/08/18 11:30 AM  Result Value Ref Range   Sodium 140 135 - 145 mmol/L   Potassium 3.3 (L) 3.5 - 5.1 mmol/L   Chloride 106 98 - 111 mmol/L   CO2 24 22 - 32 mmol/L   Glucose, Bld 93 70 - 99 mg/dL   BUN 21 8 - 23 mg/dL   Creatinine, Ser 0.73 0.61 - 1.24 mg/dL   Calcium 8.8 (L) 8.9 - 10.3 mg/dL   Total Protein 6.9 6.5 - 8.1 g/dL   Albumin 3.8 3.5 - 5.0 g/dL   AST 39 15 - 41 U/L   ALT 14 0 - 44 U/L   Alkaline Phosphatase 90 38 - 126 U/L   Total Bilirubin 0.8 0.3 - 1.2 mg/dL   GFR calc non Af Amer >60 >60 mL/min   GFR calc Af Amer >60 >60 mL/min   Anion gap 10 5 - 15    Comment: Performed at [facility], [address] [place], [place] [address]  CBC with Differential     Status: Abnormal   Collection Time: 02/08/18 11:30 AM  Result Value Ref Range   WBC 14.1 (H) 4.0 - 10.5 K/uL   RBC 4.07 (L) 4.22 - 5.81 MIL/uL   Hemoglobin 11.6 (L) 13.0 - 17.0 g/dL   HCT 34.6 (L) 39.0 - 52.0 %   MCV 85.0 80.0 - 100.0 fL   MCH 28.5 26.0 - 34.0 pg   MCHC 33.5 30.0 - 36.0 g/dL   RDW 13.2 11.5 - 15.5 %   Platelets 269 150 - 400 K/uL   nRBC 0.0 0.0 - 0.2 %   Neutrophils Relative % 91 %   Neutro Abs 12.9 (H) 1.7 - 7.7 K/uL   Lymphocytes Relative 6 %   Lymphs Abs 0.8 0.7 - 4.0 K/uL   Monocytes Relative 2 %   Monocytes Absolute 0.2 0.1 - 1.0 K/uL   Eosinophils Relative 0 %  Eosinophils Absolute 0.0 0.0 - 0.5 K/uL    Basophils Relative 0 %   Basophils Absolute 0.0 0.0 - 0.1 K/uL   Immature Granulocytes 1 %   Abs Immature Granulocytes 0.07 0.00 - 0.07 K/uL    Comment: Performed at [facility], [place], [place] [address]   Ct Head Wo Contrast  Result Date: 02/08/2018 CLINICAL DATA:  Acute right neck pain and headache beginning after radiation therapy yesterday. History of poorly differentiated neuroendocrine cancer. EXAM: CT HEAD WITHOUT CONTRAST CT CERVICAL SPINE WITHOUT CONTRAST TECHNIQUE: Multidetector CT imaging of the head and cervical spine was performed following the standard protocol without intravenous contrast. Multiplanar CT image reconstructions of the cervical spine were also generated. COMPARISON:  Brain MRI 01/09/2018.  PET-CT 01/11/2018. FINDINGS: CT HEAD FINDINGS Brain: There is no evidence of acute infarct, intracranial hemorrhage, mass, midline shift, or extra-axial fluid collection. The ventricles and sulci are within normal limits for age. Scattered cerebral white matter hypodensities are nonspecific but compatible with mild chronic small vessel ischemic disease. Vascular: Calcified atherosclerosis at the skull base. No hyperdense vessel. Skull: 3.5 cm destructive skull base lesion involving the right occipital condyle, petrous ridge, and clivus, increased in size from the prior MRI. Scattered subcentimeter lesions elsewhere in the skull. Sinuses/Orbits: Visualized paranasal sinuses and mastoid air cells are clear. Bilateral cataract extraction is noted. Other: None. CT CERVICAL SPINE FINDINGS Alignment: Slight right convex curvature of the cervical spine. No significant listhesis. Skull base and vertebrae: Right-sided skull base lesion as above. 4 cm destructive mass involving the left-sided posterior elements at T2. 1 cm lytic lesion in the C7 vertebral body. No acute fracture Soft tissues and spinal canal: Left-sided epidural tumor at T2 without evidence of high-grade  spinal stenosis. No prevertebral swelling. Disc levels: Moderate disc space narrowing and degenerative endplate spurring from V4-9 to C7-T1 with moderate to severe multilevel neural foraminal stenosis. Upper chest: Centrilobular emphysema in the lung apices. Other: Carotid artery atherosclerosis. IMPRESSION: 1. No evidence of acute intracranial abnormality. 2. Osseous metastatic disease with significant interval enlargement of a destructive right skull base mass. 3. No acute osseous abnormality identified in the cervical spine. 4. 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 5. Metastasis involving the T2 posterior elements with left lateral epidural tumor. Electronically Signed   By: Logan Bores M.D.   On: 02/08/2018 13:09   Ct Cervical Spine Wo Contrast  Result Date: 02/08/2018 CLINICAL DATA:  Acute right neck pain and headache beginning after radiation therapy yesterday. History of poorly differentiated neuroendocrine cancer. EXAM: CT HEAD WITHOUT CONTRAST CT CERVICAL SPINE WITHOUT CONTRAST TECHNIQUE: Multidetector CT imaging of the head and cervical spine was performed following the standard protocol without intravenous contrast. Multiplanar CT image reconstructions of the cervical spine were also generated. COMPARISON:  Brain MRI 01/09/2018.  PET-CT 01/11/2018. FINDINGS: CT HEAD FINDINGS Brain: There is no evidence of acute infarct, intracranial hemorrhage, mass, midline shift, or extra-axial fluid collection. The ventricles and sulci are within normal limits for age. Scattered cerebral white matter hypodensities are nonspecific but compatible with mild chronic small vessel ischemic disease. Vascular: Calcified atherosclerosis at the skull base. No hyperdense vessel. Skull: 3.5 cm destructive skull base lesion involving the right occipital condyle, petrous ridge, and clivus, increased in size from the prior MRI. Scattered subcentimeter lesions elsewhere in the  skull. Sinuses/Orbits: Visualized paranasal sinuses and mastoid air cells are clear. Bilateral cataract extraction is noted. Other: None. CT CERVICAL SPINE FINDINGS Alignment:  Slight right convex curvature of the cervical spine. No significant listhesis. Skull base and vertebrae: Right-sided skull base lesion as above. 4 cm destructive mass involving the left-sided posterior elements at T2. 1 cm lytic lesion in the C7 vertebral body. No acute fracture Soft tissues and spinal canal: Left-sided epidural tumor at T2 without evidence of high-grade spinal stenosis. No prevertebral swelling. Disc levels: Moderate disc space narrowing and degenerative endplate spurring from D7-8 to C7-T1 with moderate to severe multilevel neural foraminal stenosis. Upper chest: Centrilobular emphysema in the lung apices. Other: Carotid artery atherosclerosis. IMPRESSION: 1. No evidence of acute intracranial abnormality. 2. Osseous metastatic disease with significant interval enlargement of a destructive right skull base mass. 3. No acute osseous abnormality identified in the cervical spine. 4. 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 5. Metastasis involving the T2 posterior elements with left lateral epidural tumor. Electronically Signed   By: Logan Bores M.D.   On: 02/08/2018 13:09    Assessment:  The patient is a 77 y.o.  gentleman with stage IV large cell neuroendocrine carcinoma currently day 5 s/p cycle #1 carboplatin, etoposide and Tecentriq.  He was admitted with intractable pain involving the right base of skull.  Head and neck CT on 02/08/2018 revealed an osseous metastatic disease with significant interval enlargement (3.5 cm) of a destructive right skull base mass.  There was no acute osseous abnormality identified in the cervical spine.  There was cervical disc degeneration with moderate to severe multilevel neural foraminal stenosis.  There was metastasis involving the T2  posterior elements with left lateral epidural tumor.  Symptomatically, he is feeling better.  Exam is stable.  Plan:   1.  Oncology:  Day 5 s/p cycle #1 carboplatin, etoposide and Tecentriq with Neulasta support.  CT scan reveals an enlarging right base of skull mass.  Patient trying to use only oral pain medications.  Outpatient pain medications adjusted.  Discuss plan for palliative radiation to right skull base.    Discuss with Dr. Baruch Gouty on Monday. 2.  Pain and toxicology:  Pain medications adjustment appears to be working.  Patient currently taking:   Decadron 5 mg IV q 8hrs, Norco 5/325 1 q 6hrs prn, Toradol 7.5 mg IV q 6hrs, morphine 2 mg IV q 4hrs prn, and Oxycontin 20 mg po q 12 hrs.   Consider taper Decadron to 4 mg a day.   Consider ibuprofen instead of Toradol.  If pain controlled on oral medications, will be able to be discharged.    Thank you for allowing me to participate in Joseph Parker 's care.  I will follow him closely with you while hospitalized until Dr. Collie Siad return.   Lequita Asal, MD  02/09/2018, 1:39 PM

## 2018-02-09 NOTE — Progress Notes (Signed)
Family Meeting Note  Advance Directive:yes  Today a meeting took place with the Patient.  Patient is able to participate  The following clinical team members were present during this meeting:MD  The following were discussed:Patient's diagnosis: Metastatic cancer, Patient's progosis: Unable to determine and Goals for treatment: Full Code  Additional follow-up to be provided: prn  Time spent during discussion:20 minutes  Gorden Harms, MD

## 2018-02-10 LAB — BASIC METABOLIC PANEL
Anion gap: 6 (ref 5–15)
BUN: 21 mg/dL (ref 8–23)
CO2: 24 mmol/L (ref 22–32)
Calcium: 7.8 mg/dL — ABNORMAL LOW (ref 8.9–10.3)
Chloride: 110 mmol/L (ref 98–111)
Creatinine, Ser: 0.64 mg/dL (ref 0.61–1.24)
GFR calc Af Amer: >60 mL/min (ref >60)
GFR calc non Af Amer: >60 mL/min (ref >60)
Glucose, Bld: 99 mg/dL (ref 70–99)
Potassium: 4 mmol/L (ref 3.5–5.1)
Sodium: 140 mmol/L (ref 135–145)

## 2018-02-10 LAB — MAGNESIUM: Magnesium: 2.4 mg/dL (ref 1.7–2.4)

## 2018-02-10 MED ORDER — MELOXICAM 15 MG PO TABS: 15.0000 mg | ORAL_TABLET | Freq: Every day | ORAL | 0 refills | Status: AC

## 2018-02-10 MED ORDER — GABAPENTIN 100 MG PO CAPS: 100.0000 mg | ORAL_CAPSULE | Freq: Every day | ORAL | 0 refills | Status: DC

## 2018-02-10 MED ORDER — HEPARIN SOD (PORK) LOCK FLUSH 100 UNIT/ML IV SOLN
INTRAVENOUS | Status: AC
  Administered 2018-02-10: 12:00:00 500 [IU]
  Filled 2018-02-10: qty 5

## 2018-02-10 MED ORDER — KETOROLAC TROMETHAMINE 30 MG/ML IJ SOLN
7.5000 mg | Freq: Four times a day (QID) | INTRAMUSCULAR | Status: DC
  Administered 2018-02-10 (×2): 7.5 mg via INTRAVENOUS
  Filled 2018-02-10 (×2): qty 1

## 2018-02-10 MED ORDER — AMITRIPTYLINE HCL 50 MG PO TABS: 50.0000 mg | ORAL_TABLET | Freq: Every day | ORAL | 0 refills | Status: AC

## 2018-02-10 MED ORDER — DEXAMETHASONE 4 MG PO TABS: 4.0000 mg | ORAL_TABLET | Freq: Every day | ORAL | 0 refills | Status: AC

## 2018-02-10 NOTE — Discharge Summary (Signed)
[facility] NAME: Joseph Parker    MR#:  [id number]  DATE OF BIRTH:  07-20-1940  DATE OF ADMISSION:  02/08/2018 ADMITTING PHYSICIAN: Dustin Flock, MD  DATE OF DISCHARGE: No discharge date for patient encounter.  PRIMARY CARE PHYSICIAN: Tonia Ghent, MD    ADMISSION DIAGNOSIS:  intractable pain nuroendcrian ca  DISCHARGE DIAGNOSIS:  Active Problems:   Neck pain   SECONDARY DIAGNOSIS:   Past Medical History:  Diagnosis Date  . Cancer ([place])   . Carotid arterial disease ([place])   . Carotid artery disease ([place])   . Chickenpox   . Colon polyps   . COPD (chronic obstructive pulmonary disease) ([place])   . Hyperlipidemia   . Neuroendocrine cancer ([place]) 01/28/2018  . Normal cardiac stress test    06/2013  . Shortness of breath dyspnea   . Urinary frequency   . 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 Penicillins Itching, Swelling and Rash  . Tessalon [Benzonatate] Swelling    Had wide-spread numbness, throat swelling    DISCHARGE MEDICATIONS:   Allergies as of 02/10/2018      Reactions   Penicillins Itching, Swelling, Rash   Tessalon [benzonatate] Swelling    Had wide-spread numbness, throat swelling      Medication List    STOP taking these medications   atorvastatin 10 MG tablet Commonly known as:  LIPITOR     TAKE these medications   albuterol 108 (90 Base) MCG/ACT inhaler Commonly known as:  PROVENTIL HFA;VENTOLIN HFA Inhale 2 puffs into the lungs every 6 (six) hours as needed for wheezing or shortness of breath.   amitriptyline 50 MG tablet Commonly known as:  ELAVIL Take 1 tablet (50 mg total) by mouth at bedtime.   aspirin EC 81 MG tablet Take 1 tablet (81 mg total) by mouth daily.   dexamethasone 4 MG tablet Commonly known as:  DECADRON Take 1 tablet (4 mg total) by mouth daily.   dronabinol 5 MG capsule Commonly known as:  MARINOL Take 1 capsule (5 mg total) by mouth 2 (two) times daily before a meal.   Fluticasone-Salmeterol 250-50 MCG/DOSE Aepb Commonly known as:  ADVAIR DISKUS Inhale 1 puff into the lungs 2 (two) times daily.   gabapentin 100 MG capsule Commonly known as:  NEURONTIN Take 1 capsule (100 mg total) by mouth at bedtime.   HYDROcodone-acetaminophen 5-325 MG tablet Commonly known as:  NORCO Take 1 tablet by mouth every 6 (six) hours as needed for severe pain.   lidocaine-prilocaine cream Commonly known as:  EMLA Apply to affected area once   meloxicam 15 MG tablet Commonly known as:  MOBIC Take 1 tablet (15 mg total) by mouth daily.   ondansetron 8 MG tablet Commonly known as:  ZOFRAN Take  1 tablet (8 mg total) by mouth 2 (two) times daily as needed for refractory nausea / vomiting. Start on day 3 after carboplatin chemo.   oxyCODONE ER 13.5 MG C12a Commonly known as:  XTAMPZA ER Take 13.5 mg by mouth every 12 (twelve) hours.   polyethylene glycol powder powder Commonly known as:  GLYCOLAX/MIRALAX Take 17 g by mouth 2 (two) times daily as needed.   prochlorperazine 10 MG tablet Commonly known as:  COMPAZINE Take 1 tablet (10 mg total) by mouth every 6 (six) hours as needed (Nausea  or vomiting).   tiotropium 18 MCG inhalation capsule Commonly known as:  SPIRIVA Place 1 capsule (18 mcg total) into inhaler and inhale daily. What changed:    when to take this  reasons to take this        DISCHARGE INSTRUCTIONS:    If you experience worsening of your admission symptoms, develop shortness of breath, life threatening emergency, suicidal or homicidal thoughts you must seek medical attention immediately by calling 911 or calling your MD immediately  if symptoms less severe.  You Must read complete instructions/literature along with all the possible adverse reactions/side effects for all the Medicines you take and that have been prescribed to you. Take any new Medicines after you have completely understood and accept all the possible adverse reactions/side effects.   Please note  You were cared for by a hospitalist during your hospital stay. If you have any questions about your discharge medications or the care you received while you were in the hospital after you are discharged, you can call the unit and asked to speak with the hospitalist on call if the hospitalist that took care of you is not available. Once you are discharged, your primary care physician will handle any further medical issues. Please note that NO REFILLS for any discharge medications will be authorized once you are discharged, as it is imperative that you return to your primary care physician (or establish a relationship with a primary care physician if you do not have one) for your aftercare needs so that they can reassess your need for medications and monitor your lab values.    Today   CHIEF COMPLAINT:  No chief complaint on file.   HISTORY OF PRESENT ILLNESS:  77 y.o. male with a known history of neuroendocrine cancer with metastatic disease got radiation yesterday to his hip and back at that time patient started having pain in his neck and head.  Tums continue to get worse.  He was seen in  the oncology clinic and referred for pain control.  We were asked to admit the patient for pain control   VITAL SIGNS:  Blood pressure 127/75, pulse 78, temperature 98.2 F (36.8 C), resp. rate 17, height 5' 7.5" (1.715 m), weight 57.3 kg, SpO2 95 %.  I/O:    Intake/Output Summary (Last 24 hours) at 02/10/2018 1116 Last data filed at 02/10/2018 0900 Gross per 24 hour  Intake 2142.77 ml  Output 250 ml  Net 1892.77 ml    PHYSICAL EXAMINATION:  GENERAL:  77 y.o.-year-old patient lying in the bed with no acute distress.  EYES: Pupils equal, round, reactive to light and accommodation. No scleral icterus. Extraocular muscles intact.  HEENT: Head atraumatic, normocephalic. Oropharynx and nasopharynx clear.  NECK:  Supple, no jugular venous distention. No thyroid enlargement, no tenderness.  LUNGS: Normal breath sounds bilaterally, no wheezing, rales,rhonchi or crepitation. No use of accessory muscles of respiration.  CARDIOVASCULAR: S1, S2 normal. No murmurs,  rubs, or gallops.  ABDOMEN: Soft, non-tender, non-distended. Bowel sounds present. No organomegaly or mass.  EXTREMITIES: No pedal edema, cyanosis, or clubbing.  NEUROLOGIC: Cranial nerves II through XII are intact. Muscle strength 5/5 in all extremities. Sensation intact. Gait not checked.  PSYCHIATRIC: The patient is alert and oriented x 3.  SKIN: No obvious rash, lesion, or ulcer.   DATA REVIEW:   CBC Recent Labs  Lab 02/08/18 1130  WBC 14.1*  HGB 11.6*  HCT 34.6*  PLT 269    Chemistries  Recent Labs  Lab 02/08/18 1130 02/10/18 0846  NA 140 140  K 3.3* 4.0  CL 106 110  CO2 24 24  GLUCOSE 93 99  BUN 21 21  CREATININE 0.73 0.64  CALCIUM 8.8* 7.8*  MG  --  2.4  AST 39  --   ALT 14  --   ALKPHOS 90  --   BILITOT 0.8  --     Cardiac Enzymes No results for input(s): TROPONINI in the last 168 hours.  Microbiology Results  Results for orders placed or performed in visit on 10/28/14  Urine culture      Status: None   Collection Time: 10/28/14  2:45 PM  Result Value Ref Range Status   Colony Count NO GROWTH  Final   Organism ID, Bacteria NO GROWTH  Final    RADIOLOGY:  Ct Head Wo Contrast  Result Date: 02/08/2018 CLINICAL DATA:  Acute right neck pain and headache beginning after radiation therapy yesterday. History of poorly differentiated neuroendocrine cancer. EXAM: CT HEAD WITHOUT CONTRAST CT CERVICAL SPINE WITHOUT CONTRAST TECHNIQUE: Multidetector CT imaging of the head and cervical spine was performed following the standard protocol without intravenous contrast. Multiplanar CT image reconstructions of the cervical spine were also generated. COMPARISON:  Brain MRI 01/09/2018.  PET-CT 01/11/2018. FINDINGS: CT HEAD FINDINGS Brain: There is no evidence of acute infarct, intracranial hemorrhage, mass, midline shift, or extra-axial fluid collection. The ventricles and sulci are within normal limits for age. Scattered cerebral white matter hypodensities are nonspecific but compatible with mild chronic small vessel ischemic disease. Vascular: Calcified atherosclerosis at the skull base. No hyperdense vessel. Skull: 3.5 cm destructive skull base lesion involving the right occipital condyle, petrous ridge, and clivus, increased in size from the prior MRI. Scattered subcentimeter lesions elsewhere in the skull. Sinuses/Orbits: Visualized paranasal sinuses and mastoid air cells are clear. Bilateral cataract extraction is noted. Other: None. CT CERVICAL SPINE FINDINGS Alignment: Slight right convex curvature of the cervical spine. No significant listhesis. Skull base and vertebrae: Right-sided skull base lesion as above. 4 cm destructive mass involving the left-sided posterior elements at T2. 1 cm lytic lesion in the C7 vertebral body. No acute fracture Soft tissues and spinal canal: Left-sided epidural tumor at T2 without evidence of high-grade spinal stenosis. No prevertebral swelling. Disc levels: Moderate  disc space narrowing and degenerative endplate spurring from O1-3 to C7-T1 with moderate to severe multilevel neural foraminal stenosis. Upper chest: Centrilobular emphysema in the lung apices. Other: Carotid artery atherosclerosis. IMPRESSION: 1. No evidence of acute intracranial abnormality. 2. Osseous metastatic disease with significant interval enlargement of a destructive right skull base mass. 3. No acute osseous abnormality identified in the cervical spine. 4. 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 5. Metastasis involving the T2 posterior elements with left lateral epidural tumor. Electronically Signed   By: Logan Bores M.D.   On: 02/08/2018 13:09   Ct Cervical Spine Wo Contrast  Result Date:  02/08/2018 CLINICAL DATA:  Acute right neck pain and headache beginning after radiation therapy yesterday. History of poorly differentiated neuroendocrine cancer. EXAM: CT HEAD WITHOUT CONTRAST CT CERVICAL SPINE WITHOUT CONTRAST TECHNIQUE: Multidetector CT imaging of the head and cervical spine was performed following the standard protocol without intravenous contrast. Multiplanar CT image reconstructions of the cervical spine were also generated. COMPARISON:  Brain MRI 01/09/2018.  PET-CT 01/11/2018. FINDINGS: CT HEAD FINDINGS Brain: There is no evidence of acute infarct, intracranial hemorrhage, mass, midline shift, or extra-axial fluid collection. The ventricles and sulci are within normal limits for age. Scattered cerebral white matter hypodensities are nonspecific but compatible with mild chronic small vessel ischemic disease. Vascular: Calcified atherosclerosis at the skull base. No hyperdense vessel. Skull: 3.5 cm destructive skull base lesion involving the right occipital condyle, petrous ridge, and clivus, increased in size from the prior MRI. Scattered subcentimeter lesions elsewhere in the skull. Sinuses/Orbits: Visualized paranasal sinuses and mastoid  air cells are clear. Bilateral cataract extraction is noted. Other: None. CT CERVICAL SPINE FINDINGS Alignment: Slight right convex curvature of the cervical spine. No significant listhesis. Skull base and vertebrae: Right-sided skull base lesion as above. 4 cm destructive mass involving the left-sided posterior elements at T2. 1 cm lytic lesion in the C7 vertebral body. No acute fracture Soft tissues and spinal canal: Left-sided epidural tumor at T2 without evidence of high-grade spinal stenosis. No prevertebral swelling. Disc levels: Moderate disc space narrowing and degenerative endplate spurring from Z6-1 to C7-T1 with moderate to severe multilevel neural foraminal stenosis. Upper chest: Centrilobular emphysema in the lung apices. Other: Carotid artery atherosclerosis. IMPRESSION: 1. No evidence of acute intracranial abnormality. 2. Osseous metastatic disease with significant interval enlargement of a destructive right skull base mass. 3. No acute osseous abnormality identified in the cervical spine. 4. 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 5. Metastasis involving the T2 posterior elements with left lateral epidural tumor. Electronically Signed   By: Logan Bores M.D.   On: 02/08/2018 13:09    EKG:   Orders placed or performed during the hospital encounter of 01/20/18  . EKG 12-Lead  . EKG 12-Lead  . EKG      Management plans discussed with the patient, family and they are in agreement.  CODE STATUS:     Code Status Orders  (From admission, onward)         Start     Ordered   02/08/18 1720  Full code  Continuous     02/08/18 1719        Code Status History    Date Active Date Inactive Code Status Order ID Comments User Context   01/09/2016 2241 01/11/2016 1917 Full Code [id number]  Lance Coon, MD Inpatient   10/14/2014 1524 10/15/2014 1651 Full Code [id number]  Eustace Moore, MD Inpatient      TOTAL TIME TAKING CARE OF THIS  PATIENT: 40 minutes.    Avel Peace Jordany Russett M.D on 02/10/2018 at 11:16 AM  Between 7am to 6pm - Pager - [phone]  After 6pm go to [email or web address] - password [email or web address] [facility]  Office  [phone]  CC: Primary care physician; Tonia Ghent, MD   Note: This dictation was prepared with Dragon dictation along with smaller phrase technology. Any transcriptional errors that result from this process are unintentional.

## 2018-02-10 NOTE — Progress Notes (Signed)
[facility] Hematology/Oncology Progress Note  Date of admission: 02/08/2018  Hospital day:  02/10/2018  Chief Complaint: Joseph Parker is a 77 y.o. male with stage IV large cell neuroendocrine carcinoma who was admitted with intractable right sided head and neck pain.  Subjective:  Feeling better. Left base of skull pain 4 out of 10.  Social History: The patient is alone today.  Allergies:  Allergies  Allergen Reactions  . Penicillins Itching, Swelling and Rash  . Tessalon [Benzonatate] Swelling    Had wide-spread numbness, throat swelling    Scheduled Medications: . amitriptyline  50 mg Oral QHS  . aspirin EC  81 mg Oral Daily  . dexamethasone  5 mg Intravenous Q8H  . dronabinol  5 mg Oral BID AC  . enoxaparin (LOVENOX) injection  40 mg Subcutaneous Q24H  . gabapentin  100 mg Oral QHS  . ketorolac  7.5 mg Intravenous Q6H  . mometasone-formoterol  2 puff Inhalation BID  . oxyCODONE  20 mg Oral Q12H  . tiotropium  18 mcg Inhalation Daily    Review of Systems: GENERAL:  Feels better.  Fatigue.  No fevers, sweats.  Weight loss. PERFORMANCE STATUS (ECOG):  1-2 HEENT:  No visual changes, runny nose, sore throat, mouth sores or tenderness. Lungs: No shortness of breath or cough.  No hemoptysis. Cardiac:  No chest pain, palpitations, orthopnea, or PND. GI:  Appetite remains poor.  No nausea, vomiting, diarrhea, constipation, melena or hematochezia. GU:  No urgency, frequency, dysuria, or hematuria. Musculoskeletal:  Base of right skull pain.  No muscle tenderness. Extremities:  No pain or swelling. Skin:  No rashes or skin changes. Neuro:  Right headache (radiates from skull).  No numbness or weakness, balance or coordination issues. Endocrine:  No diabetes, thyroid issues, hot flashes or night sweats. Psych:  No mood changes, depression or anxiety. Pain:  Base of right skull pain (4 out of 10). Review of systems:  All other systems reviewed and  found to be negative.  Physical Exam: Blood pressure 127/75, pulse 78, temperature 98.2 F (36.8 C), resp. rate 17, height 5' 7.5" (1.715 m), weight 126 lb 6.4 oz (57.3 kg), SpO2 95 %.  GENERAL:  Thin gentleman lying comfortably on the medical unit in no acute distress. MENTAL STATUS:  Alert and oriented to person, place and time. HEAD:  Pearline Cables hair.  Normocephalic, atraumatic, face symmetric, no Cushingoid features. EYES:  Blue eyes.  Pupils equal round and reactive to light and accomodation.  No conjunctivitis or scleral icterus. ENT:  Oropharynx clear without lesion.  Tongue slightly brown coated.  No thrush.  Mucous membranes dry.  RESPIRATORY:  Clear to auscultation without rales, wheezes or rhonchi. CARDIOVASCULAR:  Regular rate and rhythm without murmur, rub or gallop. ABDOMEN:  Soft, non-tender, with active bowel sounds, and no hepatosplenomegaly.  No masses. SKIN:  No rashes, ulcers or lesions. EXTREMITIES: No edema, no skin discoloration or tenderness.  No palpable cords. NEUROLOGICAL: Tongue deviated (right hypoglossal nerve involvement). PSYCH:  Appropriate.   Results for orders placed or performed during the hospital encounter of 02/08/18 (from the past 48 hour(s))  Basic metabolic panel     Status: Abnormal   Collection Time: 02/10/18  8:46 AM  Result Value Ref Range   Sodium 140 135 - 145 mmol/L   Potassium 4.0 3.5 - 5.1 mmol/L   Chloride 110 98 - 111 mmol/L   CO2 24 22 - 32 mmol/L   Glucose, Bld 99 70 - 99 mg/dL  BUN 21 8 - 23 mg/dL   Creatinine, Ser 0.64 0.61 - 1.24 mg/dL   Calcium 7.8 (L) 8.9 - 10.3 mg/dL   GFR calc non Af Amer >60 >60 mL/min   GFR calc Af Amer >60 >60 mL/min   Anion gap 6 5 - 15    Comment: Performed at [facility], [address], [place], [place] [address]  Magnesium     Status: None   Collection Time: 02/10/18  8:46 AM  Result Value Ref Range   Magnesium 2.4 1.7 - 2.4 mg/dL    Comment: Performed at [facility], [address] [place], [place] [address]   No results found.  Assessment:  Joseph Parker is a 77 y.o. male with stage IV large cell neuroendocrine carcinoma currently day 6 s/p cycle #1 carboplatin, etoposide and Tecentriq.  He was admitted with intractable pain involving the right base of skull.  Head and neck CT on 02/08/2018 revealed an osseous metastatic disease with significant interval enlargement (3.5 cm) of a destructive right skull base mass.  There was no acute osseous abnormality identified in the cervical spine.  There was cervical disc degeneration with moderate to severe multilevel neural foraminal stenosis.  There was metastasis involving the T2 posterior elements with left lateral epidural tumor.  Symptomatically, he feels better.  Pain appears controlled on oral pain medications.  Plan:   1.  Oncology:             Day 6 s/p cycle #1 carboplatin, etoposide and Tecentriq with Neulasta support.             CT scan revealed an enlarging right base of skull mass.             Pain improved on oral pain medications.             Discuss plan for palliative radiation to right skull base.               Discuss with Dr. Baruch Gouty on Monday. 2.  Pain and toxicology:             Pain medications adjustment appears to be working.             Patient currently taking:                         Oxycontin 20 mg po q 12 hrs and Norco 5/325 1 q 6hrs prn.                         Discuss plan to taper Decadron to 4 mg a day then off as an outpatient.                         Discuss use of ibuprofen instead of Toradol. 3.  Disposition:  Plan for discharge today per Dr. Jerelyn Charles.   Lequita Asal, MD  02/10/2018, 1:01 PM

## 2018-02-10 NOTE — Progress Notes (Signed)
Pt D/C to home with wife. Port deaccessed. Education completed. All questions answered. Belongings sent with pt. Prescriptions sent with pt.

## 2018-02-11 ENCOUNTER — Inpatient Hospital Stay: Payer: Medicare Other | Admitting: Oncology

## 2018-02-11 ENCOUNTER — Inpatient Hospital Stay
Admission: RE | Admit: 2018-02-11 | Discharge: 2018-02-16 | DRG: 843 | Disposition: A | Discharge status: Home / Self Care | Payer: Medicare Other | Source: Ambulatory Visit | Attending: Internal Medicine | Admitting: Internal Medicine

## 2018-02-11 ENCOUNTER — Ambulatory Visit
Admission: RE | Admit: 2018-02-11 | Discharge: 2018-02-11 | Disposition: A | Discharge status: Home / Self Care | Payer: Medicare Other | Source: Ambulatory Visit | Attending: Radiation Oncology | Admitting: Radiation Oncology

## 2018-02-11 ENCOUNTER — Ambulatory Visit: Payer: Medicare Other

## 2018-02-11 ENCOUNTER — Inpatient Hospital Stay: Payer: Medicare Other

## 2018-02-11 ENCOUNTER — Other Ambulatory Visit: Payer: Self-pay

## 2018-02-11 ENCOUNTER — Inpatient Hospital Stay (HOSPITAL_BASED_OUTPATIENT_CLINIC_OR_DEPARTMENT_OTHER): Payer: Medicare Other | Admitting: Hospice and Palliative Medicine

## 2018-02-11 ENCOUNTER — Inpatient Hospital Stay: Payer: Medicare Other | Attending: Oncology

## 2018-02-11 VITALS — BP 139/69 | HR 83 | Temp 98.3°F

## 2018-02-11 DIAGNOSIS — R32 Unspecified urinary incontinence: Secondary | ICD-10-CM

## 2018-02-11 DIAGNOSIS — R630 Anorexia: Secondary | ICD-10-CM | POA: Diagnosis present

## 2018-02-11 DIAGNOSIS — C7B8 Other secondary neuroendocrine tumors: Principal | ICD-10-CM | POA: Diagnosis present

## 2018-02-11 DIAGNOSIS — Z79899 Other long term (current) drug therapy: Secondary | ICD-10-CM

## 2018-02-11 DIAGNOSIS — Z880 Allergy status to penicillin: Secondary | ICD-10-CM | POA: Diagnosis not present

## 2018-02-11 DIAGNOSIS — F419 Anxiety disorder, unspecified: Secondary | ICD-10-CM | POA: Diagnosis present

## 2018-02-11 DIAGNOSIS — M899 Disorder of bone, unspecified: Secondary | ICD-10-CM | POA: Diagnosis not present

## 2018-02-11 DIAGNOSIS — N179 Acute kidney failure, unspecified: Secondary | ICD-10-CM | POA: Diagnosis not present

## 2018-02-11 DIAGNOSIS — D696 Thrombocytopenia, unspecified: Secondary | ICD-10-CM

## 2018-02-11 DIAGNOSIS — C7A8 Other malignant neuroendocrine tumors: Secondary | ICD-10-CM

## 2018-02-11 DIAGNOSIS — R52 Pain, unspecified: Secondary | ICD-10-CM | POA: Diagnosis not present

## 2018-02-11 DIAGNOSIS — Z515 Encounter for palliative care: Secondary | ICD-10-CM | POA: Diagnosis present

## 2018-02-11 DIAGNOSIS — E860 Dehydration: Secondary | ICD-10-CM | POA: Diagnosis present

## 2018-02-11 DIAGNOSIS — D61810 Antineoplastic chemotherapy induced pancytopenia: Secondary | ICD-10-CM | POA: Diagnosis present

## 2018-02-11 DIAGNOSIS — C7A00 Malignant carcinoid tumor of unspecified site: Secondary | ICD-10-CM | POA: Diagnosis not present

## 2018-02-11 DIAGNOSIS — T451X5A Adverse effect of antineoplastic and immunosuppressive drugs, initial encounter: Secondary | ICD-10-CM | POA: Diagnosis present

## 2018-02-11 DIAGNOSIS — I2510 Atherosclerotic heart disease of native coronary artery without angina pectoris: Secondary | ICD-10-CM | POA: Diagnosis present

## 2018-02-11 DIAGNOSIS — C3490 Malignant neoplasm of unspecified part of unspecified bronchus or lung: Secondary | ICD-10-CM | POA: Diagnosis not present

## 2018-02-11 DIAGNOSIS — N1330 Unspecified hydronephrosis: Secondary | ICD-10-CM | POA: Diagnosis present

## 2018-02-11 DIAGNOSIS — B370 Candidal stomatitis: Secondary | ICD-10-CM

## 2018-02-11 DIAGNOSIS — J449 Chronic obstructive pulmonary disease, unspecified: Secondary | ICD-10-CM | POA: Diagnosis present

## 2018-02-11 DIAGNOSIS — Z87891 Personal history of nicotine dependence: Secondary | ICD-10-CM

## 2018-02-11 DIAGNOSIS — G893 Neoplasm related pain (acute) (chronic): Secondary | ICD-10-CM | POA: Diagnosis present

## 2018-02-11 DIAGNOSIS — R51 Headache: Secondary | ICD-10-CM

## 2018-02-11 DIAGNOSIS — N401 Enlarged prostate with lower urinary tract symptoms: Secondary | ICD-10-CM | POA: Diagnosis present

## 2018-02-11 DIAGNOSIS — D6959 Other secondary thrombocytopenia: Secondary | ICD-10-CM | POA: Diagnosis not present

## 2018-02-11 DIAGNOSIS — E785 Hyperlipidemia, unspecified: Secondary | ICD-10-CM | POA: Diagnosis present

## 2018-02-11 DIAGNOSIS — R6881 Early satiety: Secondary | ICD-10-CM

## 2018-02-11 DIAGNOSIS — Z7982 Long term (current) use of aspirin: Secondary | ICD-10-CM

## 2018-02-11 DIAGNOSIS — D72819 Decreased white blood cell count, unspecified: Secondary | ICD-10-CM | POA: Diagnosis not present

## 2018-02-11 DIAGNOSIS — Z888 Allergy status to other drugs, medicaments and biological substances status: Secondary | ICD-10-CM

## 2018-02-11 DIAGNOSIS — C7951 Secondary malignant neoplasm of bone: Secondary | ICD-10-CM | POA: Diagnosis not present

## 2018-02-11 DIAGNOSIS — N138 Other obstructive and reflux uropathy: Secondary | ICD-10-CM | POA: Diagnosis not present

## 2018-02-11 DIAGNOSIS — C799 Secondary malignant neoplasm of unspecified site: Secondary | ICD-10-CM | POA: Diagnosis not present

## 2018-02-11 DIAGNOSIS — C7A090 Malignant carcinoid tumor of the bronchus and lung: Secondary | ICD-10-CM

## 2018-02-11 DIAGNOSIS — R634 Abnormal weight loss: Secondary | ICD-10-CM

## 2018-02-11 DIAGNOSIS — N1339 Other hydronephrosis: Secondary | ICD-10-CM | POA: Diagnosis not present

## 2018-02-11 DIAGNOSIS — D61818 Other pancytopenia: Secondary | ICD-10-CM | POA: Diagnosis not present

## 2018-02-11 DIAGNOSIS — R338 Other retention of urine: Secondary | ICD-10-CM | POA: Diagnosis present

## 2018-02-11 DIAGNOSIS — F411 Generalized anxiety disorder: Secondary | ICD-10-CM

## 2018-02-11 LAB — CREATININE, SERUM
Creatinine, Ser: 0.58 mg/dL — ABNORMAL LOW (ref 0.61–1.24)
GFR calc non Af Amer: >60 mL/min (ref >60)

## 2018-02-11 LAB — CBC
HCT: 31.2 % — ABNORMAL LOW (ref 39.0–52.0)
Hemoglobin: 10.6 g/dL — ABNORMAL LOW (ref 13.0–17.0)
MCH: 29.3 pg (ref 26.0–34.0)
MCHC: 34 g/dL (ref 30.0–36.0)
MCV: 86.2 fL (ref 80.0–100.0)
NRBC: 0 % (ref 0.0–0.2)
Platelets: 150 10*3/uL (ref 150–400)
RBC: 3.62 MIL/uL — ABNORMAL LOW (ref 4.22–5.81)
RDW: 13.2 % (ref 11.5–15.5)
WBC: 11.8 10*3/uL — ABNORMAL HIGH (ref 4.0–10.5)

## 2018-02-11 MED ORDER — POLYETHYLENE GLYCOL 3350 17 G PO PACK: 17.0000 g | PACK | Freq: Two times a day (BID) | ORAL | Status: DC | PRN

## 2018-02-11 MED ORDER — ACETAMINOPHEN 325 MG PO TABS: 650.0000 mg | ORAL_TABLET | Freq: Four times a day (QID) | ORAL | Status: DC | PRN

## 2018-02-11 MED ORDER — ASPIRIN EC 81 MG PO TBEC
81.0000 mg | DELAYED_RELEASE_TABLET | Freq: Every day | ORAL | Status: DC
  Administered 2018-02-12 – 2018-02-16 (×5): 81 mg via ORAL
  Filled 2018-02-11 (×5): qty 1

## 2018-02-11 MED ORDER — HYDROMORPHONE HCL 1 MG/ML IJ SOLN
1.0000 mg | Freq: Once | INTRAMUSCULAR | Status: AC
  Administered 2018-02-11: 1 mg via INTRAVENOUS
  Filled 2018-02-11: qty 1

## 2018-02-11 MED ORDER — OXYCODONE HCL ER 15 MG PO T12A
15.0000 mg | EXTENDED_RELEASE_TABLET | Freq: Two times a day (BID) | ORAL | Status: DC
  Administered 2018-02-11: 20:00:00 15 mg via ORAL
  Filled 2018-02-11: qty 1

## 2018-02-11 MED ORDER — MOMETASONE FURO-FORMOTEROL FUM 200-5 MCG/ACT IN AERO
2.0000 | INHALATION_SPRAY | Freq: Two times a day (BID) | RESPIRATORY_TRACT | Status: DC
  Administered 2018-02-11 – 2018-02-16 (×10): 2 via RESPIRATORY_TRACT
  Filled 2018-02-11: qty 8.8

## 2018-02-11 MED ORDER — ALBUTEROL SULFATE (2.5 MG/3ML) 0.083% IN NEBU: 2.5000 mL | INHALATION_SOLUTION | Freq: Four times a day (QID) | RESPIRATORY_TRACT | Status: DC | PRN

## 2018-02-11 MED ORDER — OXYCODONE HCL ER 15 MG PO T12A: 15.0000 mg | EXTENDED_RELEASE_TABLET | Freq: Two times a day (BID) | ORAL | Status: DC

## 2018-02-11 MED ORDER — ACETAMINOPHEN 650 MG RE SUPP: 650.0000 mg | Freq: Four times a day (QID) | RECTAL | Status: DC | PRN

## 2018-02-11 MED ORDER — LORAZEPAM 2 MG/ML IJ SOLN: 0.5000 mg | INTRAMUSCULAR | Status: DC | PRN

## 2018-02-11 MED ORDER — TIOTROPIUM BROMIDE MONOHYDRATE 18 MCG IN CAPS
18.0000 ug | ORAL_CAPSULE | Freq: Every day | RESPIRATORY_TRACT | Status: DC
  Administered 2018-02-12 – 2018-02-16 (×5): 18 ug via RESPIRATORY_TRACT
  Filled 2018-02-11: qty 5

## 2018-02-11 MED ORDER — HYDROMORPHONE HCL 1 MG/ML IJ SOLN: 1.0000 mg | Freq: Once | INTRAMUSCULAR | Status: AC

## 2018-02-11 MED ORDER — ONDANSETRON HCL 4 MG PO TABS: 4.0000 mg | ORAL_TABLET | Freq: Four times a day (QID) | ORAL | Status: DC | PRN

## 2018-02-11 MED ORDER — DRONABINOL 2.5 MG PO CAPS
5.0000 mg | ORAL_CAPSULE | Freq: Two times a day (BID) | ORAL | Status: DC
  Administered 2018-02-12 – 2018-02-16 (×9): 5 mg via ORAL
  Filled 2018-02-11 (×9): qty 2

## 2018-02-11 MED ORDER — DEXAMETHASONE 4 MG PO TABS
4.0000 mg | ORAL_TABLET | Freq: Every day | ORAL | Status: DC
  Administered 2018-02-12 – 2018-02-16 (×5): 4 mg via ORAL
  Filled 2018-02-11 (×5): qty 1

## 2018-02-11 MED ORDER — AMITRIPTYLINE HCL 25 MG PO TABS
50.0000 mg | ORAL_TABLET | Freq: Every day | ORAL | Status: DC
  Administered 2018-02-11 – 2018-02-15 (×5): 50 mg via ORAL
  Filled 2018-02-11 (×5): qty 2

## 2018-02-11 MED ORDER — HYDROMORPHONE HCL 1 MG/ML IJ SOLN
1.0000 mg | INTRAMUSCULAR | Status: DC | PRN
  Administered 2018-02-11 – 2018-02-13 (×6): 1 mg via INTRAVENOUS
  Filled 2018-02-11 (×6): qty 1

## 2018-02-11 MED ORDER — ONDANSETRON HCL 4 MG/2ML IJ SOLN: 4.0000 mg | Freq: Four times a day (QID) | INTRAMUSCULAR | Status: DC | PRN

## 2018-02-11 MED ORDER — ORAL CARE MOUTH RINSE
15.0000 mL | Freq: Two times a day (BID) | OROMUCOSAL | Status: DC
  Administered 2018-02-12 – 2018-02-16 (×7): 15 mL via OROMUCOSAL

## 2018-02-11 MED ORDER — LORAZEPAM 2 MG/ML IJ SOLN
0.5000 mg | Freq: Once | INTRAMUSCULAR | Status: AC
  Administered 2018-02-11: 0.5 mg via INTRAVENOUS
  Filled 2018-02-11: qty 1

## 2018-02-11 MED ORDER — HYDROMORPHONE HCL 1 MG/ML IJ SOLN
0.5000 mg | Freq: Once | INTRAMUSCULAR | Status: AC
  Administered 2018-02-11: 0.5 mg via INTRAVENOUS
  Filled 2018-02-11: qty 0.5

## 2018-02-11 MED ORDER — MELOXICAM 7.5 MG PO TABS
15.0000 mg | ORAL_TABLET | Freq: Every day | ORAL | Status: DC
  Administered 2018-02-12 – 2018-02-14 (×3): 15 mg via ORAL
  Filled 2018-02-11 (×4): qty 2

## 2018-02-11 MED ORDER — GABAPENTIN 100 MG PO CAPS
100.0000 mg | ORAL_CAPSULE | Freq: Every day | ORAL | Status: DC
  Administered 2018-02-11: 100 mg via ORAL
  Filled 2018-02-11: qty 1

## 2018-02-11 MED ORDER — ENOXAPARIN SODIUM 40 MG/0.4ML ~~LOC~~ SOLN
40.0000 mg | SUBCUTANEOUS | Status: DC
  Administered 2018-02-12 – 2018-02-13 (×3): 40 mg via SUBCUTANEOUS
  Filled 2018-02-11 (×3): qty 0.4

## 2018-02-11 MED ORDER — PROCHLORPERAZINE MALEATE 10 MG PO TABS
10.0000 mg | ORAL_TABLET | Freq: Four times a day (QID) | ORAL | Status: DC | PRN
  Filled 2018-02-11: qty 1

## 2018-02-11 MED ORDER — POLYETHYLENE GLYCOL 3350 17 GM/SCOOP PO POWD
17.0000 g | Freq: Two times a day (BID) | ORAL | Status: DC | PRN
  Filled 2018-02-11: qty 255

## 2018-02-11 NOTE — Progress Notes (Signed)
Follow up visit made. Patient is now in room. Family are at bedside. Patient is somewhat sedated following administration of lorazepam. Family verbalized frustration with patient being discharged from he hospital too early. Patient's wife and son both say their primary goal is for him to be comfortable. They both again say they would like to forgo future chemotherapy. They asked me about hospice. However, they want patient to be able to feel decent enough that he can participate in those conversations.

## 2018-02-11 NOTE — Progress Notes (Deleted)
   Symptom Management Consult note [facility]  Telephone:([phone] [phone]  Patient Care Team: Tonia Ghent, MD as PCP - General (Family Medicine) Eustace Moore, MD as Consulting Physician (Neurosurgery) Telford Nab, RN as Registered Nurse   Name of the patient: Joseph Parker  [id number]  07-20-1940   Date of visit: 02/11/2018  Diagnosis: Stage IV large cell neuroendocrine carcinoma  Chief Complaint: Intractable right sided head and neck pain  Current Treatment: s/p cycle 1 carbo, etoposide and Tecentriq.  Oncology History: Last seen by primary oncologist on 02/08/2018 for acute right-sided neck pain and headache.  Recently had first cycle of carbo/etoposide and Tecentriq.  Receiving palliative radiation to SI joint/lumbar spine region.  Attempted narcotic pain medication x24 hours without relief.  Found to have reproducible palpable tenderness.  Stat imaging conducted revealing enlargement of destructive right skull base mass.  He was directly admitted to the hospital for pain management.  Patient was admitted to the hospital for intractable pain involving the right base of skull (02/08/18-02/09/18).  Head and neck CT on 02/08/2018 revealed an osseous metastasis disease with significant interval enlargement of a destructive right skull mass base.  No acute abnormalities of cervical spine.  Pain improved on oral pain medications consisting of OxyContin 20 mg p.o. every 12 and Norco 5/325 mg every 6 hours PRN.  Plan is to taper off Decadron 4 mg a day and to use ibuprofen instead of Toradol.  Palliative radiation to right skull base and near future.    Neuroendocrine cancer ([place])   01/28/2018 Initial Diagnosis    Neuroendocrine cancer ([place])    02/05/2018 -  Chemotherapy    The patient had palonosetron (ALOXI) injection 0.25 mg, 0.25 mg, Intravenous,  Once, 1 of 4 cycles Administration: 0.25 mg (02/05/2018) pegfilgrastim-cbqv (UDENYCA)  injection 6 mg, 6 mg, Subcutaneous, Once, 1 of 4 cycles Administration: 6 mg (02/08/2018) CARBOplatin (PARAPLATIN) 360 mg in sodium chloride 0.9 % 250 mL chemo infusion, 370 mg (100 % of original dose 374 mg), Intravenous,  Once, 1 of 4 cycles Dose modification:   (original dose 374 mg, Cycle 1) Administration: 360 mg (02/05/2018) etoposide (VEPESID) 170 mg in sodium chloride 0.9 % 500 mL chemo infusion, 100 mg/m2 = 170 mg, Intravenous,  Once, 1 of 4 cycles Administration: 170 mg (02/05/2018), 170 mg (02/06/2018), 170 mg (02/07/2018) fosaprepitant (EMEND) 150 mg, dexamethasone (DECADRON) 12 mg in sodium chloride 0.9 % 145 mL IVPB, , Intravenous,  Once, 1 of 4 cycles Administration:  (02/05/2018) atezolizumab (TECENTRIQ) 1,200 mg in sodium chloride 0.9 % 250 mL chemo infusion, 1,200 mg, Intravenous, Once, 1 of 8 cycles Administration: 1,200 mg (02/05/2018)  for chemotherapy treatment.      Subjective Data:  ECOG: {CHL ONC XL:[id number]}

## 2018-02-11 NOTE — Progress Notes (Signed)
[place]  [phone] [phone]   Name: Joseph Parker Date: 02/11/2018 MRN: [id number]  DOB: 29-Apr-1940  Patient Care Team: Tonia Ghent, MD as PCP - General (Family Medicine) Eustace Moore, MD as Consulting Physician (Neurosurgery) Telford Nab, RN as Registered Nurse    REASON FOR CONSULTATION: [place] consult requested for this 77 y.o. 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  He also has a history of pathologic fracture to right trochanter.  PMH also notable for COPD, CAD, carotid artery disease, and recent oral candidiasis.  Patient has been receiving treatment with carboplatin and etoposide and Tecentriq. He has also received XRT to SI joint/lumbar region. He was seen on 02/08/2018 by Dr. Tasia Catchings with intractable pain, which was unrelieved with multiple doses of hydromorphone and dexamethasone.  CT of the cervical spine and head revealed metastatic disease to T2 and enlargement of a destructive right skull mass. Patient was admitted for intractable pain and remained hospitalized 02/08/2018 to 02/10/2018 for same.  Pain ultimately improved with combination of OxyContin, PRN Norco, dexamethasone, gabapentin, and IV Toradol.  Patient was discharged home and presents today to the clinic again with intractable pain.   SOCIAL HISTORY:    Patient is married lives at home with his wife.  He has multiple children who are involved in his care.  ADVANCE DIRECTIVES:  Not on file  CODE STATUS: DNR  PAST MEDICAL HISTORY: Past Medical History:  Diagnosis Date  . Cancer ([place])   . Carotid arterial disease ([place])   . Carotid artery disease ([place])   . Chickenpox   . Colon polyps   . COPD (chronic obstructive pulmonary disease) ([place])   . Hyperlipidemia   . Neuroendocrine cancer ([place]) 01/28/2018  .  Normal cardiac stress test    06/2013  . Shortness of breath dyspnea   . Urinary frequency   . Urinary hesitancy     PAST SURGICAL HISTORY:  Past Surgical History:  Procedure Laterality Date  . CATARACT EXTRACTION, BILATERAL    . COLONOSCOPY    . ESOPHAGOGASTRODUODENOSCOPY    . HERNIA REPAIR     right inquinal - as child  . LUMBAR LAMINECTOMY/DECOMPRESSION MICRODISCECTOMY Left 10/14/2014   Procedure: Left Lumbar three-four extraforaminla microdiskectomy;  Surgeon: Eustace Moore, MD;  Location: [facility];  Service: Neurosurgery;  Laterality: Left;  Left L34 extraforaminal microdiskectomy  . POLYPECTOMY    . PORTA CATH INSERTION N/A 01/31/2018   Procedure: PORTA CATH INSERTION;  Surgeon: Algernon Huxley, MD;  Location: [facility];  Service: Cardiovascular;  Laterality: N/A;  . TONSILLECTOMY     remote - childhood  . UPPER GASTROINTESTINAL ENDOSCOPY      HEMATOLOGY/ONCOLOGY HISTORY:    Neuroendocrine cancer ([place])   01/28/2018 Initial Diagnosis    Neuroendocrine cancer ([place])    02/05/2018 -  Chemotherapy    The patient had palonosetron (ALOXI) injection 0.25 mg, 0.25 mg, Intravenous,  Once, 1 of 4 cycles Administration: 0.25 mg (02/05/2018) pegfilgrastim-cbqv (UDENYCA) injection 6 mg, 6 mg, Subcutaneous, Once, 1 of 4 cycles Administration: 6 mg (02/08/2018) CARBOplatin (PARAPLATIN) 360 mg in sodium chloride 0.9 % 250 mL chemo infusion, 370 mg (100 % of original dose 374 mg), Intravenous,  Once, 1 of 4 cycles Dose modification:   (original dose 374 mg, Cycle 1) Administration: 360 mg (02/05/2018) etoposide (VEPESID) 170 mg in sodium  chloride 0.9 % 500 mL chemo infusion, 100 mg/m2 = 170 mg, Intravenous,  Once, 1 of 4 cycles Administration: 170 mg (02/05/2018), 170 mg (02/06/2018), 170 mg (02/07/2018) fosaprepitant (EMEND) 150 mg, dexamethasone (DECADRON) 12 mg in sodium chloride 0.9 % 145 mL IVPB, , Intravenous,  Once, 1 of 4 cycles Administration:   (02/05/2018) atezolizumab (TECENTRIQ) 1,200 mg in sodium chloride 0.9 % 250 mL chemo infusion, 1,200 mg, Intravenous, Once, 1 of 8 cycles Administration: 1,200 mg (02/05/2018)  for chemotherapy treatment.      ALLERGIES:  is allergic to penicillins and tessalon [benzonatate].  MEDICATIONS:  Current Outpatient Medications  Medication Sig Dispense Refill  . albuterol (PROVENTIL HFA;VENTOLIN HFA) 108 (90 Base) MCG/ACT inhaler Inhale 2 puffs into the lungs every 6 (six) hours as needed for wheezing or shortness of breath. 1 Inhaler 12  . amitriptyline (ELAVIL) 50 MG tablet Take 1 tablet (50 mg total) by mouth at bedtime. 30 tablet 0  . aspirin EC 81 MG tablet Take 1 tablet (81 mg total) by mouth daily.    Marland Kitchen dexamethasone (DECADRON) 4 MG tablet Take 1 tablet (4 mg total) by mouth daily. 10 tablet 0  . dronabinol (MARINOL) 5 MG capsule Take 1 capsule (5 mg total) by mouth 2 (two) times daily before a meal. 60 capsule 0  . Fluticasone-Salmeterol (ADVAIR DISKUS) 250-50 MCG/DOSE AEPB Inhale 1 puff into the lungs 2 (two) times daily. 60 each prn  . gabapentin (NEURONTIN) 100 MG capsule Take 1 capsule (100 mg total) by mouth at bedtime. 30 capsule 0  . HYDROcodone-acetaminophen (NORCO) 5-325 MG tablet Take 1 tablet by mouth every 6 (six) hours as needed for severe pain. 30 tablet 0  . lidocaine-prilocaine (EMLA) cream Apply to affected area once 30 g 3  . meloxicam (MOBIC) 15 MG tablet Take 1 tablet (15 mg total) by mouth daily. 30 tablet 0  . ondansetron (ZOFRAN) 8 MG tablet Take 1 tablet (8 mg total) by mouth 2 (two) times daily as needed for refractory nausea / vomiting. Start on day 3 after carboplatin chemo. 30 tablet 1  . oxyCODONE ER (XTAMPZA ER) 13.5 MG C12A Take 13.5 mg by mouth every 12 (twelve) hours. 30 each 0  . polyethylene glycol powder (GLYCOLAX/MIRALAX) powder Take 17 g by mouth 2 (two) times daily as needed.    . prochlorperazine (COMPAZINE) 10 MG tablet Take 1 tablet (10 mg total) by  mouth every 6 (six) hours as needed (Nausea or vomiting). 30 tablet 1  . tiotropium (SPIRIVA) 18 MCG inhalation capsule Place 1 capsule (18 mcg total) into inhaler and inhale daily. (Patient taking differently: Place 18 mcg into inhaler and inhale daily as needed. ) 30 capsule 12   No current facility-administered medications for this visit.    Facility-Administered Medications Ordered in Other Visits  Medication Dose Route Frequency Provider Last Rate Last Dose  . HYDROmorphone (DILAUDID) injection 1 mg  1 mg Intravenous Once Travontae Freiberger, Kirt Boys, NP        VITAL SIGNS: There were no vitals taken for this visit. There were no vitals filed for this visit.  Estimated body mass index is 19.5 kg/m as calculated from the following:   Height as of 02/08/18: 5' 7.5" (1.715 m).   Weight as of 02/08/18: 126 lb 6.4 oz (57.3 kg).  LABS: CBC:    Component Value Date/Time   WBC 14.1 (H) 02/08/2018 1130   HGB 11.6 (L) 02/08/2018 1130   HCT 34.6 (L) 02/08/2018 1130   PLT  269 02/08/2018 1130   MCV 85.0 02/08/2018 1130   NEUTROABS 12.9 (H) 02/08/2018 1130   LYMPHSABS 0.8 02/08/2018 1130   MONOABS 0.2 02/08/2018 1130   EOSABS 0.0 02/08/2018 1130   BASOSABS 0.0 02/08/2018 1130   Comprehensive Metabolic Panel:    Component Value Date/Time   NA 140 02/10/2018 0846   K 4.0 02/10/2018 0846   CL 110 02/10/2018 0846   CO2 24 02/10/2018 0846   BUN 21 02/10/2018 0846   CREATININE 0.64 02/10/2018 0846   GLUCOSE 99 02/10/2018 0846   CALCIUM 7.8 (L) 02/10/2018 0846   AST 39 02/08/2018 1130   ALT 14 02/08/2018 1130   ALKPHOS 90 02/08/2018 1130   BILITOT 0.8 02/08/2018 1130   PROT 6.9 02/08/2018 1130   ALBUMIN 3.8 02/08/2018 1130    RADIOGRAPHIC STUDIES: Dg Chest 2 View  Result Date: 01/20/2018 CLINICAL DATA:  Shortness of breath and left upper chest pain in a patient with lung carcinoma. EXAM: CHEST - 2 VIEW COMPARISON:  PET CT scan 01/11/2018. PA and lateral chest 09/01/2016. FINDINGS: The  lungs are emphysematous. No consolidative process, pneumothorax or effusion. Mass lesion in the left mediastinum is identified as seen on the prior exams. Heart size is normal. No acute bony abnormality. Destructive lesion in the left transverse process of T2 is not visible on this exam. No acute bony abnormality is seen. IMPRESSION: No acute disease. Mediastinal mass lesion on the left as seen on prior exams. Emphysema. Electronically Signed   By: Inge Rise M.D.   On: 01/20/2018 14:40   Ct Head Wo Contrast  Result Date: 02/08/2018 CLINICAL DATA:  Acute right neck pain and headache beginning after radiation therapy yesterday. History of poorly differentiated neuroendocrine cancer. EXAM: CT HEAD WITHOUT CONTRAST CT CERVICAL SPINE WITHOUT CONTRAST TECHNIQUE: Multidetector CT imaging of the head and cervical spine was performed following the standard protocol without intravenous contrast. Multiplanar CT image reconstructions of the cervical spine were also generated. COMPARISON:  Brain MRI 01/09/2018.  PET-CT 01/11/2018. FINDINGS: CT HEAD FINDINGS Brain: There is no evidence of acute infarct, intracranial hemorrhage, mass, midline shift, or extra-axial fluid collection. The ventricles and sulci are within normal limits for age. Scattered cerebral white matter hypodensities are nonspecific but compatible with mild chronic small vessel ischemic disease. Vascular: Calcified atherosclerosis at the skull base. No hyperdense vessel. Skull: 3.5 cm destructive skull base lesion involving the right occipital condyle, petrous ridge, and clivus, increased in size from the prior MRI. Scattered subcentimeter lesions elsewhere in the skull. Sinuses/Orbits: Visualized paranasal sinuses and mastoid air cells are clear. Bilateral cataract extraction is noted. Other: None. CT CERVICAL SPINE FINDINGS Alignment: Slight right convex curvature of the cervical spine. No significant listhesis. Skull base and vertebrae: Right-sided  skull base lesion as above. 4 cm destructive mass involving the left-sided posterior elements at T2. 1 cm lytic lesion in the C7 vertebral body. No acute fracture Soft tissues and spinal canal: Left-sided epidural tumor at T2 without evidence of high-grade spinal stenosis. No prevertebral swelling. Disc levels: Moderate disc space narrowing and degenerative endplate spurring from I3-5 to C7-T1 with moderate to severe multilevel neural foraminal stenosis. Upper chest: Centrilobular emphysema in the lung apices. Other: Carotid artery atherosclerosis. IMPRESSION: 1. No evidence of acute intracranial abnormality. 2. Osseous metastatic disease with significant interval enlargement of a destructive right skull base mass. 3. No acute osseous abnormality identified in the cervical spine. 4. 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 5. Metastasis involving  the T2 posterior elements with left lateral epidural tumor. Electronically Signed   By: Logan Bores M.D.   On: 02/08/2018 13:09   Ct Angio Chest Pe W And/or Wo Contrast  Result Date: 01/20/2018 CLINICAL DATA:  Shortness of breath and left chest pain today. History of lung cancer. EXAM: CT ANGIOGRAPHY CHEST WITH CONTRAST TECHNIQUE: Multidetector CT imaging of the chest was performed using the standard protocol during bolus administration of intravenous contrast. Multiplanar CT image reconstructions and MIPs were obtained to evaluate the vascular anatomy. CONTRAST:  75 mL OMNIPAQUE IOHEXOL 350 MG/ML SOLN COMPARISON:  PET CT scan 01/11/2018. FINDINGS: Cardiovascular: Satisfactory opacification of the pulmonary arteries to the segmental level. No evidence of pulmonary embolism. Normal heart size. No pericardial effusion. Mediastinum/Nodes: Again seen is a mass lesion centered in the mediastinum on the left which measures 5.4 x 3.9 cm on image 43, unchanged. As noted on the prior examination, the lesion is centered in the AP  window. It abuts the right main pulmonary artery without invading it. The mass also surrounds the interlobar branch to the anterior left upper lobe. No right hilar lymphadenopathy is identified. The esophagus and thyroid gland appear normal. Lungs/Pleura: The patient has a small left pleural effusion which is new since the prior exam. The lungs demonstrate extensive centrilobular emphysema. A left upper lobe nodule on image 42 measuring 0.7 cm is unchanged. There also a few scattered ground-glass attenuation opacities in the anterior left upper lobe which are unchanged. The right lung is clear. Upper Abdomen: No acute abnormality. Cyst in the right kidney noted. Musculoskeletal: Destructive lesion in the left T2 transverse process is identified and measures approximately 3.5 x 2.0 cm on image 11 of series 7, not notably changed. Also seen is a large destructive lesion in the left side of the T12 vertebral body extending into the left pedicle, unchanged. No new bony abnormality is identified. Review of the MIP images confirms the above findings. IMPRESSION: Negative for pulmonary embolus or acute cardiopulmonary disease. Small left pleural effusion is new since the prior exam. No change in a left mediastinal mass lesion with metastatic deposits in the left side of T12 and left transverse process of T2. Aortic Atherosclerosis (ICD10-I70.0) and Severe emphysema (ICD10-J43.9). Electronically Signed   By: Inge Rise M.D.   On: 01/20/2018 16:40   Ct Cervical Spine Wo Contrast  Result Date: 02/08/2018 CLINICAL DATA:  Acute right neck pain and headache beginning after radiation therapy yesterday. History of poorly differentiated neuroendocrine cancer. EXAM: CT HEAD WITHOUT CONTRAST CT CERVICAL SPINE WITHOUT CONTRAST TECHNIQUE: Multidetector CT imaging of the head and cervical spine was performed following the standard protocol without intravenous contrast. Multiplanar CT image reconstructions of the cervical  spine were also generated. COMPARISON:  Brain MRI 01/09/2018.  PET-CT 01/11/2018. FINDINGS: CT HEAD FINDINGS Brain: There is no evidence of acute infarct, intracranial hemorrhage, mass, midline shift, or extra-axial fluid collection. The ventricles and sulci are within normal limits for age. Scattered cerebral white matter hypodensities are nonspecific but compatible with mild chronic small vessel ischemic disease. Vascular: Calcified atherosclerosis at the skull base. No hyperdense vessel. Skull: 3.5 cm destructive skull base lesion involving the right occipital condyle, petrous ridge, and clivus, increased in size from the prior MRI. Scattered subcentimeter lesions elsewhere in the skull. Sinuses/Orbits: Visualized paranasal sinuses and mastoid air cells are clear. Bilateral cataract extraction is noted. Other: None. CT CERVICAL SPINE FINDINGS Alignment: Slight right convex curvature of the cervical spine. No significant listhesis. Skull base  and vertebrae: Right-sided skull base lesion as above. 4 cm destructive mass involving the left-sided posterior elements at T2. 1 cm lytic lesion in the C7 vertebral body. No acute fracture Soft tissues and spinal canal: Left-sided epidural tumor at T2 without evidence of high-grade spinal stenosis. No prevertebral swelling. Disc levels: Moderate disc space narrowing and degenerative endplate spurring from B5-8 to C7-T1 with moderate to severe multilevel neural foraminal stenosis. Upper chest: Centrilobular emphysema in the lung apices. Other: Carotid artery atherosclerosis. IMPRESSION: 1. No evidence of acute intracranial abnormality. 2. Osseous metastatic disease with significant interval enlargement of a destructive right skull base mass. 3. No acute osseous abnormality identified in the cervical spine. 4. 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 5. Metastasis involving the T2 posterior elements with left lateral epidural  tumor. Electronically Signed   By: Logan Bores M.D.   On: 02/08/2018 13:09   Ct Biopsy  Result Date: 01/22/2018 INDICATION: Lytic bone lesions and lung mass EXAM: CT BIOPSY MEDICATIONS: None. ANESTHESIA/SEDATION: Fentanyl 100 mcg IV; Versed 1 mg IV Moderate Sedation Time:  6 minutes The patient was continuously monitored during the procedure by the interventional radiology nurse under my direct supervision. FLUOROSCOPY TIME:  Fluoroscopy Time:  minutes  seconds ( mGy). COMPLICATIONS: None immediate. PROCEDURE: Informed written consent was obtained from the patient after a thorough discussion of the procedural risks, benefits and alternatives. All questions were addressed. Maximal Sterile Barrier Technique was utilized including caps, mask, sterile gowns, sterile gloves, sterile drape, hand hygiene and skin antiseptic. A timeout was performed prior to the initiation of the procedure. Under CT guidance, a(n) 17 gauge guide needle was advanced into the right iliac bone lesion. Subsequently, 3 18 gauge core biopsies were obtained. The guide needle was removed. Post biopsy images demonstrate no hemorrhage. Patient tolerated the procedure well without complication. Vital sign monitoring by nursing staff during the procedure will continue as patient is in the special procedures unit for post procedure observation. FINDINGS: The images document guide needle placement within the right iliac bone lesion. Post biopsy images demonstrate no hemorrhage. IMPRESSION: Successful CT-guided core biopsy of a right iliac bone lesion. Electronically Signed   By: Marybelle Killings M.D.   On: 01/22/2018 12:48    PERFORMANCE STATUS (ECOG) : 3 - Symptomatic, >50% confined to bed  Review of Systems As noted above. Otherwise, a complete review of systems is negative.  Physical Exam General: NAD, frail appearing, thin, temporal wasting bilaterally Cardiovascular: regular rate and rhythm Pulmonary: clear ant fields Abdomen: soft,  nontender, + bowel sounds GU: no suprapubic tenderness Extremities: no edema, no joint deformities Skin: no rashes Neurological: Weakness, cranial nerves II through XII grossly intact  IMPRESSION: I met with patient, wife, and granddaughter today in the clinic.  Patient presents with intractable right temporal and occipital pain.  At home, he was taking Xtampza ER (last dosed at 3:30 in the morning), Norco 5 325 mg every 6 hours (last given about an hour prior to presenting the clinic), dexamethasone 4 mg, and Mobic 15 mg daily.  Wife shows me the list where she has been recording times of medication administration.  Family report that some of the medications have been helpful but have been very short-lived.  In the clinic, we gave him hydromorphone 0.5 mg IV x 3 and Lorazepam 0.5 mg IV.  Each dose of medication seemed to help slightly but patient would wake screaming in intractable pain.  Case discussed with Dr. Grayland Ormond as  Dr. Tasia Catchings is out today.  Will pursue admission for intractable pain.   Patient has also been seen by Dr. Donella Stade with plan for initiation of XRT to the skull.  Patient will receive simulation tomorrow and likely start radiation on Thursday or Friday.  In the interim, he will likely need continued IV pain medications until we can stabilize his oral regimen.  Patient's wife asked about hospice and we discussed this option extensively.  She does not think that patient or family will want further chemotherapy.  We will discuss this with Dr. Tasia Catchings when she returns and get her input into the plan of care.  Patient's wife says that patient would not want to be resuscitated or have his life prolonged artificially.  She is most interested in keeping him comfortable and focusing on quality of life.  She agreed with DO NOT RESUSCITATE.  PLAN: 1.  Hospital admission for intractable pain 2.  Recommend hydromorphone 0.5 to 1 mg IV every 3 hours as needed for severe pain 3.  Recommend increasing  OxyContin to 15 mg every 8 hours 4.  Recommend switching from Norco to oxycodone IR 5 to 10 mg every 4 hours as needed for moderate pain 5.  Recommend continuation of dexamethasone, gabapentin, and meloxicam. 6.  Recommend starting PPI for GI protection from NSAIDs 7.  Recommend starting Lorazepam 0.5 mg 3 times daily as needed for anxiety 8.  Recommend palliative care consult inpatient 9.  DNR   Time Total: 60 minutes  Visit consisted of counseling and education dealing with the complex and emotionally intense issues of symptom management and palliative care in the setting of serious and potentially life-threatening illness.Greater than 50%  of this time was spent counseling and coordinating care related to the above assessment and plan.  Signed by: Altha Harm, PhD, NP-C [phone] (Work Cell)

## 2018-02-11 NOTE — Progress Notes (Signed)
Patient arrived from cancer center and is resting comfortably. Family at bedside. Agreed to let patient rest at this time. Madlyn Frankel, RN

## 2018-02-12 ENCOUNTER — Ambulatory Visit: Payer: Medicare Other

## 2018-02-12 ENCOUNTER — Ambulatory Visit
Admission: RE | Admit: 2018-02-12 | Discharge: 2018-02-12 | Disposition: A | Discharge status: Home / Self Care | Payer: Medicare Other | Source: Ambulatory Visit | Attending: Radiation Oncology | Admitting: Radiation Oncology

## 2018-02-12 DIAGNOSIS — R32 Unspecified urinary incontinence: Secondary | ICD-10-CM

## 2018-02-12 DIAGNOSIS — C7A8 Other malignant neuroendocrine tumors: Secondary | ICD-10-CM

## 2018-02-12 DIAGNOSIS — Z87891 Personal history of nicotine dependence: Secondary | ICD-10-CM | POA: Diagnosis not present

## 2018-02-12 DIAGNOSIS — C7951 Secondary malignant neoplasm of bone: Secondary | ICD-10-CM

## 2018-02-12 DIAGNOSIS — G893 Neoplasm related pain (acute) (chronic): Secondary | ICD-10-CM

## 2018-02-12 DIAGNOSIS — B370 Candidal stomatitis: Secondary | ICD-10-CM

## 2018-02-12 DIAGNOSIS — R52 Pain, unspecified: Secondary | ICD-10-CM

## 2018-02-12 DIAGNOSIS — Z515 Encounter for palliative care: Secondary | ICD-10-CM

## 2018-02-12 DIAGNOSIS — C3490 Malignant neoplasm of unspecified part of unspecified bronchus or lung: Secondary | ICD-10-CM | POA: Diagnosis not present

## 2018-02-12 LAB — CBC
HCT: 29 % — ABNORMAL LOW (ref 39.0–52.0)
Hemoglobin: 9.7 g/dL — ABNORMAL LOW (ref 13.0–17.0)
MCH: 29 pg (ref 26.0–34.0)
MCHC: 33.4 g/dL (ref 30.0–36.0)
MCV: 86.6 fL (ref 80.0–100.0)
Platelets: 129 10*3/uL — ABNORMAL LOW (ref 150–400)
RBC: 3.35 MIL/uL — AB (ref 4.22–5.81)
RDW: 13.2 % (ref 11.5–15.5)
WBC: 6.3 10*3/uL (ref 4.0–10.5)
nRBC: 0 % (ref 0.0–0.2)

## 2018-02-12 LAB — COMPREHENSIVE METABOLIC PANEL
ALBUMIN: 3 g/dL — AB (ref 3.5–5.0)
ALT: 11 U/L (ref 0–44)
AST: 21 U/L (ref 15–41)
Alkaline Phosphatase: 76 U/L (ref 38–126)
Anion gap: 6 (ref 5–15)
BUN: 18 mg/dL (ref 8–23)
CO2: 27 mmol/L (ref 22–32)
Calcium: 7.9 mg/dL — ABNORMAL LOW (ref 8.9–10.3)
Chloride: 106 mmol/L (ref 98–111)
Creatinine, Ser: 0.62 mg/dL (ref 0.61–1.24)
GFR calc Af Amer: >60 mL/min (ref >60)
GFR calc non Af Amer: >60 mL/min (ref >60)
Glucose, Bld: 86 mg/dL (ref 70–99)
Potassium: 3.3 mmol/L — ABNORMAL LOW (ref 3.5–5.1)
Sodium: 139 mmol/L (ref 135–145)
Total Bilirubin: 0.8 mg/dL (ref 0.3–1.2)
Total Protein: 5.7 g/dL — ABNORMAL LOW (ref 6.5–8.1)

## 2018-02-12 MED ORDER — OXYCODONE HCL ER 15 MG PO T12A
15.0000 mg | EXTENDED_RELEASE_TABLET | Freq: Three times a day (TID) | ORAL | Status: DC
  Administered 2018-02-12 – 2018-02-14 (×6): 15 mg via ORAL
  Filled 2018-02-12 (×7): qty 1

## 2018-02-12 MED ORDER — GABAPENTIN 100 MG PO CAPS
100.0000 mg | ORAL_CAPSULE | Freq: Two times a day (BID) | ORAL | Status: DC
  Administered 2018-02-12 – 2018-02-14 (×5): 100 mg via ORAL
  Filled 2018-02-12 (×5): qty 1

## 2018-02-12 MED ORDER — OXYCODONE HCL 5 MG PO TABS
5.0000 mg | ORAL_TABLET | ORAL | Status: DC | PRN
  Administered 2018-02-13 – 2018-02-15 (×6): 10 mg via ORAL
  Filled 2018-02-12 (×6): qty 2

## 2018-02-12 MED ORDER — PANTOPRAZOLE SODIUM 20 MG PO TBEC
20.0000 mg | DELAYED_RELEASE_TABLET | Freq: Every day | ORAL | Status: DC
  Administered 2018-02-12 – 2018-02-16 (×5): 20 mg via ORAL
  Filled 2018-02-12 (×7): qty 1

## 2018-02-12 MED ORDER — HYDROMORPHONE HCL 1 MG/ML IJ SOLN
1.0000 mg | Freq: Once | INTRAMUSCULAR | Status: AC
  Administered 2018-02-12: 1 mg via INTRAVENOUS
  Filled 2018-02-12: qty 1

## 2018-02-12 MED ORDER — SENNA 8.6 MG PO TABS
1.0000 | ORAL_TABLET | Freq: Two times a day (BID) | ORAL | Status: DC
  Administered 2018-02-12 – 2018-02-16 (×8): 8.6 mg via ORAL
  Filled 2018-02-12 (×8): qty 1

## 2018-02-12 NOTE — H&P (Signed)
[facility] [place] NAME: Joseph Parker    MR#:  [id number]  DATE OF BIRTH:  1941-01-05  DATE OF ADMISSION:  02/11/2018  PRIMARY CARE PHYSICIAN: Tonia Ghent, MD   REQUESTING/REFERRING PHYSICIAN: Borders, Kirt Boys, NP  CHIEF COMPLAINT:   Intractable pain HISTORY OF PRESENT ILLNESS:  Joseph Parker  is a 77 y.o. 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 Tasia Catchings as an outpatient was recently seen by her on December 20th 2019 for intractable pain and the pain was relieved at that time with multiple doses of Dilaudid and dexamethasone and eventually patient was admitted for the same reason to [facility] from 02/08/2018 to 02/10/2018 and seen by Borders, Kirt Boys, NP on 02/11/2018 and as patient was in intractable pain he is sent over to the hospital as a direct admission.  Patient reports that Dilaudid and IV Ativan really helps him with his pain.  And Dr. Donella Stade is planning to do palliation radiation therapy in a day or 2  PAST MEDICAL HISTORY:   Past Medical History:  Diagnosis Date  . Cancer ([place])   . Carotid arterial disease ([place])   . Carotid artery disease ([place])   . Chickenpox   . Colon polyps   . COPD (chronic obstructive pulmonary disease) ([place])   . Hyperlipidemia   . Neuroendocrine cancer ([place]) 01/28/2018  . Normal cardiac stress test    06/2013  . Shortness of breath dyspnea   . Urinary frequency   . Urinary hesitancy     PAST SURGICAL HISTOIRY:   Past Surgical History:  Procedure Laterality Date  . CATARACT EXTRACTION, BILATERAL    . COLONOSCOPY    . ESOPHAGOGASTRODUODENOSCOPY    . HERNIA REPAIR     right inquinal - as child  . LUMBAR LAMINECTOMY/DECOMPRESSION MICRODISCECTOMY Left 10/14/2014   Procedure: Left Lumbar three-four extraforaminla microdiskectomy;   Surgeon: Eustace Moore, MD;  Location: [facility];  Service: Neurosurgery;  Laterality: Left;  Left L34 extraforaminal microdiskectomy  . POLYPECTOMY    . PORTA CATH INSERTION N/A 01/31/2018   Procedure: PORTA CATH INSERTION;  Surgeon: Algernon Huxley, MD;  Location: [facility];  Service: Cardiovascular;  Laterality: N/A;  . TONSILLECTOMY     remote - childhood  . UPPER GASTROINTESTINAL ENDOSCOPY      SOCIAL HISTORY:   Social History   Tobacco Use  . Smoking status: Former Smoker    Packs/day: 0.50    Years: 40.00    Pack years: 20.00    Last attempt to quit: 07/20/1999    Years since quitting: 18.5  . Smokeless tobacco: Never Used  Substance Use Topics  . Alcohol use: No    Alcohol/week: 0.0 standard drinks    FAMILY HISTORY:   Family History  Problem Relation Age of Onset  . Heart disease Mother        CABG; Valve replacement - died post-op  . Alzheimer's disease Father   . Diabetes Father   . Diabetes Son   . Colon cancer Sister   . Prostate cancer Neg Hx   . Colon polyps Neg Hx     DRUG ALLERGIES:   Allergies  Allergen Reactions  . Penicillins Itching, Swelling and Rash    DID THE REACTION INVOLVE: Swelling of the face/tongue/throat, SOB, or low BP? Yes  Sudden or severe rash/hives, skin peeling, or the inside of the mouth or nose? Unknown Did it require medical treatment? Yes When did it last happen?30+ years If all above answers are "NO", may proceed with cephalosporin use.   Lavella Lemons [Benzonatate] Swelling    Had wide-spread numbness, throat swelling    REVIEW OF SYSTEMS:  CONSTITUTIONAL: No fever, fatigue or weakness.  EYES: No blurred or double vision.  EARS, NOSE, AND THROAT: No tinnitus or ear pain.  RESPIRATORY: No cough, shortness of breath, wheezing or hemoptysis.  CARDIOVASCULAR: No chest pain, orthopnea, edema.  GASTROINTESTINAL: No nausea, vomiting, diarrhea or abdominal pain.  GENITOURINARY: No dysuria, hematuria.   ENDOCRINE: No polyuria, nocturia,  HEMATOLOGY: No anemia, easy bruising or bleeding SKIN: No rash or lesion. MUSCULOSKELETAL: Reporting severe intractable pain 8 out of 10 NEUROLOGIC: No tingling, numbness, weakness.  PSYCHIATRY: No anxiety or depression.   MEDICATIONS AT HOME:   Prior to Admission medications   Medication Sig Start Date End Date Taking? Authorizing Provider  albuterol (PROVENTIL HFA;VENTOLIN HFA) 108 (90 Base) MCG/ACT inhaler Inhale 2 puffs into the lungs every 6 (six) hours as needed for wheezing or shortness of breath. 04/19/17  Yes Tonia Ghent, MD  amitriptyline (ELAVIL) 50 MG tablet Take 1 tablet (50 mg total) by mouth at bedtime. 02/10/18  Yes Salary, Avel Peace, MD  aspirin EC 81 MG tablet Take 1 tablet (81 mg total) by mouth daily. 06/11/13  Yes Belva Crome, MD  dexamethasone (DECADRON) 4 MG tablet Take 1 tablet (4 mg total) by mouth daily. 02/10/18  Yes Salary, Avel Peace, MD  Fluticasone-Salmeterol (ADVAIR DISKUS) 250-50 MCG/DOSE AEPB Inhale 1 puff into the lungs 2 (two) times daily. 04/19/17  Yes Tonia Ghent, MD  gabapentin (NEURONTIN) 100 MG capsule Take 1 capsule (100 mg total) by mouth at bedtime. 02/10/18  Yes Salary, Avel Peace, MD  HYDROcodone-acetaminophen (NORCO) 5-325 MG tablet Take 1 tablet by mouth every 6 (six) hours as needed for severe pain. 01/23/18  Yes Earlie Server, MD  lidocaine-prilocaine (EMLA) cream Apply to affected area once 01/28/18  Yes Earlie Server, MD  meloxicam (MOBIC) 15 MG tablet Take 1 tablet (15 mg total) by mouth daily. 02/10/18 02/10/19 Yes Salary, Avel Peace, MD  ondansetron (ZOFRAN) 8 MG tablet Take 1 tablet (8 mg total) by mouth 2 (two) times daily as needed for refractory nausea / vomiting. Start on day 3 after carboplatin chemo. 01/28/18  Yes Earlie Server, MD  oxyCODONE ER [facility] ER) 13.5 MG C12A Take 13.5 mg by mouth every 12 (twelve) hours. 01/28/18  Yes Earlie Server, MD  polyethylene glycol powder (GLYCOLAX/MIRALAX) powder Take 17 g by  mouth 2 (two) times daily as needed. 01/04/18  Yes Tonia Ghent, MD  prochlorperazine (COMPAZINE) 10 MG tablet Take 1 tablet (10 mg total) by mouth every 6 (six) hours as needed (Nausea or vomiting). 01/28/18  Yes Earlie Server, MD  dronabinol (MARINOL) 5 MG capsule Take 1 capsule (5 mg total) by mouth 2 (two) times daily before a meal. Patient not taking: Reported on 02/12/2018 02/05/18   Earlie Server, MD      VITAL SIGNS:  Blood pressure (!) 148/80, pulse 83, temperature (!) 97.5 F (36.4 C), temperature source Oral, resp. rate 18, height 5\' 8"  (1.727 m), SpO2 94 %.  PHYSICAL EXAMINATION:  GENERAL:  77 y.o.-year-old patient lying in the bed with no acute distress.  EYES: Pupils equal, round, reactive to light and accommodation. No scleral icterus. Extraocular muscles  intact.  HEENT: Head atraumatic, normocephalic. Oropharynx and nasopharynx clear.  NECK:  Supple, no jugular venous distention. No thyroid enlargement, no tenderness.  LUNGS: Normal breath sounds bilaterally, no wheezing, rales,rhonchi or crepitation. No use of accessory muscles of respiration.  CARDIOVASCULAR: S1, S2 normal. No murmurs, rubs, or gallops.  ABDOMEN: Soft, nontender, nondistended. Bowel sounds present. EXTREMITIES: No pedal edema, cyanosis, or clubbing.  NEUROLOGIC: Diffuse tenderness is present.  PSYCHIATRIC: The patient is alert and oriented x 2-3.  SKIN: No obvious rash, lesion, or ulcer.   LABORATORY PANEL:   CBC Recent Labs  Lab 02/12/18 0538  WBC 6.3  HGB 9.7*  HCT 29.0*  PLT 129*   ------------------------------------------------------------------------------------------------------------------  Chemistries  Recent Labs  Lab 02/10/18 0846  02/12/18 0538  NA 140  --  139  K 4.0  --  3.3*  CL 110  --  106  CO2 24  --  27  GLUCOSE 99  --  86  BUN 21  --  18  CREATININE 0.64   < > 0.62  CALCIUM 7.8*  --  7.9*  MG 2.4  --   --   AST  --   --  21  ALT  --   --  11  ALKPHOS  --   --  76   BILITOT  --   --  0.8   < > = values in this interval not displayed.   ------------------------------------------------------------------------------------------------------------------  Cardiac Enzymes No results for input(s): TROPONINI in the last 168 hours. ------------------------------------------------------------------------------------------------------------------  RADIOLOGY:  No results found.  EKG:   Orders placed or performed during the hospital encounter of 01/20/18  . EKG 12-Lead  . EKG 12-Lead  . EKG    IMPRESSION AND PLAN:    #Acute on chronic intractable pain secondary to bone mets Admit to MedSurg unit Pain management with IV Dilaudid for severe pain Patient is scheduled to get palliative radiation therapy by Dr. 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 Tasia Catchings  #History of COPD No exacerbation at this time.  Bronchodilator treatment  #Coronary artery disease-patient is asymptomatic Continue home medications   All the records are reviewed and case discussed with ED provider. Management plans discussed with the patient, wife and daughter at bedside and they are in agreement.  CODE STATUS: Full code until patient decides on his CODE STATUS  TOTAL TIME TAKING CARE OF THIS PATIENT: 42  minutes.   Note: This dictation was prepared with Dragon dictation along with smaller phrase technology. Any transcriptional errors that result from this process are unintentional.  Nicholes Mango M.D on 02/12/2018 at 5:13 PM  Between 7am to 6pm - Pager - [phone]  After 6pm go to [email or web address] - password [email or web address] [facility]  Office  [phone]  CC: Primary care physician; Tonia Ghent, MD

## 2018-02-12 NOTE — Progress Notes (Signed)
[facility] [place] NAME: Joseph Parker    MR#:  [id number]  DATE OF BIRTH:  10-Jan-1941  SUBJECTIVE:  CHIEF COMPLAINT: Came with significant pain not controlled by oral medications.  Increase the dose by palliative care nurse practitioner and patient feels slightly better today.  REVIEW OF SYSTEMS:  CONSTITUTIONAL: No fever, fatigue or weakness.  EYES: No blurred or double vision.  EARS, NOSE, AND THROAT: No tinnitus or ear pain.  RESPIRATORY: No cough, shortness of breath, wheezing or hemoptysis.  CARDIOVASCULAR: No chest pain, orthopnea, edema.  GASTROINTESTINAL: No nausea, vomiting, diarrhea or abdominal pain.  GENITOURINARY: No dysuria, hematuria.  ENDOCRINE: No polyuria, nocturia,  HEMATOLOGY: No anemia, easy bruising or bleeding SKIN: No rash or lesion. MUSCULOSKELETAL: No joint pain or arthritis.   NEUROLOGIC: No tingling, numbness, weakness.  PSYCHIATRY: No anxiety or depression.   ROS  DRUG ALLERGIES:   Allergies  Allergen Reactions  . Penicillins Itching, Swelling and Rash    DID THE REACTION INVOLVE: Swelling of the face/tongue/throat, SOB, or low BP? Yes Sudden or severe rash/hives, skin peeling, or the inside of the mouth or nose? Unknown Did it require medical treatment? Yes When did it last happen?30+ years If all above answers are "NO", may proceed with cephalosporin use.   Lavella Lemons [Benzonatate] Swelling    Had wide-spread numbness, throat swelling    VITALS:  Blood pressure (!) 148/80, pulse 83, temperature (!) 97.5 F (36.4 C), temperature source Oral, resp. rate 18, height 5\' 8"  (1.727 m), SpO2 94 %.  PHYSICAL EXAMINATION:  GENERAL:  77 y.o.-year-old patient lying in the bed with no acute distress.  EYES: Pupils equal, round, reactive to light and accommodation. No scleral icterus. Extraocular muscles intact.  HEENT: Head atraumatic, normocephalic. Oropharynx and nasopharynx clear.  NECK:   Supple, no jugular venous distention. No thyroid enlargement, no tenderness.  LUNGS: Normal breath sounds bilaterally, no wheezing, rales,rhonchi or crepitation. No use of accessory muscles of respiration.  CARDIOVASCULAR: S1, S2 normal. No murmurs, rubs, or gallops.  ABDOMEN: Soft, nontender, nondistended. Bowel sounds present. No organomegaly or mass.  EXTREMITIES: No pedal edema, cyanosis, or clubbing.  NEUROLOGIC: Cranial nerves II through XII are intact. Muscle strength 5/5 in all extremities. Sensation intact. Gait not checked.  PSYCHIATRIC: The patient is alert and oriented x 3.  SKIN: No obvious rash, lesion, or ulcer.   Physical Exam LABORATORY PANEL:   CBC Recent Labs  Lab 02/12/18 0538  WBC 6.3  HGB 9.7*  HCT 29.0*  PLT 129*   ------------------------------------------------------------------------------------------------------------------  Chemistries  Recent Labs  Lab 02/10/18 0846  02/12/18 0538  NA 140  --  139  K 4.0  --  3.3*  CL 110  --  106  CO2 24  --  27  GLUCOSE 99  --  86  BUN 21  --  18  CREATININE 0.64   < > 0.62  CALCIUM 7.8*  --  7.9*  MG 2.4  --   --   AST  --   --  21  ALT  --   --  11  ALKPHOS  --   --  76  BILITOT  --   --  0.8   < > = values in this interval not displayed.   ------------------------------------------------------------------------------------------------------------------  Cardiac Enzymes No results for input(s): TROPONINI in the last 168 hours. ------------------------------------------------------------------------------------------------------------------  RADIOLOGY:  No results found.  ASSESSMENT AND PLAN:   Active Problems:   Intractable  pain   Palliative care encounter   *Acute cancer pain Secondary to neuroendocrine tumor with metastatic lesions to spine/skull base/T2 areas which are causing pain per oncology/family-for radiation to these other lesions-starting this Thursday. Palliative care had seen  the patient and had increased OxyContin 15 mg 3 times a day along with short-acting oral meds for pain control and continue Dilaudid for breakthrough pain.  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  *COPDwithout exacerbation Stable Breathing treatments PRN  *Chronic hyperlipidemia Stable Discontinued statin therapy given concern for exacerbation of pain    All the records are reviewed and case discussed with Care Management/Social Workerr. Management plans discussed with the patient, family and they are in agreement.  CODE STATUS: full.  TOTAL TIME TAKING CARE OF THIS PATIENT: 35 minutes.     POSSIBLE D/C IN 1-2 DAYS, DEPENDING ON CLINICAL CONDITION.   Vaughan Basta M.D on 02/12/2018   Between 7am to 6pm - Pager - [phone]  After 6pm go to [email or web address] - password [email or web address] [facility]  Office  [phone]  CC: Primary care physician; Tonia Ghent, MD  Note: This dictation was prepared with Dragon dictation along with smaller phrase technology. Any transcriptional errors that result from this process are unintentional.

## 2018-02-12 NOTE — Progress Notes (Signed)
Radiation Oncology Follow up Note  Name: Joseph Parker   Date:   02/11/2018 MRN:  [id number] DOB: 10-14-1940    This 77 y.o. male presents to the clinic today for evaluation of significant head pain from metastatic disease to the base of skull in patient with known stage IV large cell neuroendocrine carcinoma.  REFERRING PROVIDER: Tonia Ghent, MD  HPI: patient is a 77 year old male currently under treatment for metastatic disease to his pelvis from large cell neuroendocrine carcinoma stage IV. He was recentlyadmitted to the hospital with intractable right-sided head and neck pain. He is a large lytic lesion in the base of the skull as the source of his pain. He is under symptom management and I been asked to evaluate him for possible palliative radiation therapy to this area..  COMPLICATIONS OF TREATMENT: none  FOLLOW UP COMPLIANCE: keeps appointments   PHYSICAL EXAM:  There were no vitals taken for this visit. Patient has significant pain. No deviation of his eyes no change in his neurologic status Well-developed well-nourished patient in NAD. HEENT reveals PERLA, EOMI, discs not visualized.  Oral cavity is clear. No oral mucosal lesions are identified. Neck is clear without evidence of cervical or supraclavicular adenopathy. Lungs are clear to A&P. Cardiac examination is essentially unremarkable with regular rate and rhythm without murmur rub or thrill. Abdomen is benign with no organomegaly or masses noted. Motor sensory and DTR levels are equal and symmetric in the upper and lower extremities. Cranial nerves II through XII are grossly intact. Proprioception is intact. No peripheral adenopathy or edema is identified. No motor or sensory levels are noted. Crude visual fields are within normal range.  RADIOLOGY RESULTS: PET CT scan and CT scans of the head and neck are reviewed compatible with the above-stated findings showing large lytic lesion in the base of skull.  PLAN: at this  time I to go ahead with palliative radiation therapy to this area. Would plan on delivering 3000 cGy in 10 fractions. I've set him up for similar relation tomorrow will start his treatments the day after Christmas. Risks and benefits of treatment including possible hair loss possible sore throat possible skin reaction and fatigue all were discussed in detail with the patient and his family. They all comprehend my treatment plan well.  I would like to take this opportunity to thank you for allowing me to participate in the care of your patient.Noreene Filbert, MD

## 2018-02-12 NOTE — Consult Note (Signed)
[facility]  [phone] [phone]   Name: Joseph Parker Date: 02/12/2018 MRN: [id number]  DOB: [birth year]  Patient Care Team: Tonia Ghent, MD as PCP - General (Family Medicine) Eustace Moore, MD as Consulting Physician (Neurosurgery) Telford Nab, RN as Registered Nurse    REASON FOR CONSULTATION: [facility] consult requested for this 77 y.o. 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  He also has a history of pathologic fracture to right trochanter.  PMH also notable for COPD, CAD, carotid artery disease, and recent oral candidiasis.  Patient has been receiving treatment with carboplatin and etoposide and Tecentriq. He has also received XRT to SI joint/lumbar region. He was seen on 02/08/2018 by Dr. Tasia Catchings with intractable pain, which was unrelieved with multiple doses of hydromorphone and dexamethasone.  CT of the cervical spine and head revealed metastatic disease to T2 and enlargement of a destructive right skull mass. Patient was admitted for intractable pain and remained hospitalized 02/08/2018 to 02/10/2018 for same.  Pain ultimately improved with combination of OxyContin, PRN Norco, dexamethasone, gabapentin, and IV Toradol.  Patient was discharged home and presents today to the clinic again with intractable pain.   SOCIAL HISTORY:    Patient is married and lives at home with his wife. He has two sons from a previous marriage. He has three sons by his current wife. Patient retired from [employer] as a Librarian, academic.   ADVANCE DIRECTIVES:  Not on file  CODE STATUS: Full code  PAST MEDICAL HISTORY: Past Medical History:  Diagnosis Date  . Cancer ([place])   . Carotid arterial disease ([place])   . Carotid artery disease ([place])   . Chickenpox   . Colon polyps   . COPD (chronic obstructive pulmonary  disease) ([place])   . Hyperlipidemia   . Neuroendocrine cancer ([place]) 01/28/2018  . Normal cardiac stress test    06/2013  . Shortness of breath dyspnea   . Urinary frequency   . Urinary hesitancy     PAST SURGICAL HISTORY:  Past Surgical History:  Procedure Laterality Date  . CATARACT EXTRACTION, BILATERAL    . COLONOSCOPY    . ESOPHAGOGASTRODUODENOSCOPY    . HERNIA REPAIR     right inquinal - as child  . LUMBAR LAMINECTOMY/DECOMPRESSION MICRODISCECTOMY Left 10/14/2014   Procedure: Left Lumbar three-four extraforaminla microdiskectomy;  Surgeon: Eustace Moore, MD;  Location: [facility];  Service: Neurosurgery;  Laterality: Left;  Left L34 extraforaminal microdiskectomy  . POLYPECTOMY    . PORTA CATH INSERTION N/A 01/31/2018   Procedure: PORTA CATH INSERTION;  Surgeon: Algernon Huxley, MD;  Location: [facility];  Service: Cardiovascular;  Laterality: N/A;  . TONSILLECTOMY     remote - childhood  . UPPER GASTROINTESTINAL ENDOSCOPY      HEMATOLOGY/ONCOLOGY HISTORY:    Neuroendocrine cancer ([place])   01/28/2018 Initial Diagnosis    Neuroendocrine cancer ([place])    02/05/2018 -  Chemotherapy    The patient had palonosetron (ALOXI) injection 0.25 mg, 0.25 mg, Intravenous,  Once, 1 of 4 cycles Administration: 0.25 mg (02/05/2018) pegfilgrastim-cbqv (UDENYCA) injection 6 mg, 6 mg, Subcutaneous, Once, 1 of 4 cycles Administration: 6 mg (02/08/2018) CARBOplatin (PARAPLATIN) 360 mg in sodium chloride 0.9 % 250 mL chemo infusion, 370 mg (100 % of original dose 374 mg), Intravenous,  Once, 1 of 4 cycles Dose modification:   (  original dose 374 mg, Cycle 1) Administration: 360 mg (02/05/2018) etoposide (VEPESID) 170 mg in sodium chloride 0.9 % 500 mL chemo infusion, 100 mg/m2 = 170 mg, Intravenous,  Once, 1 of 4 cycles Administration: 170 mg (02/05/2018), 170 mg (02/06/2018), 170 mg (02/07/2018) fosaprepitant (EMEND) 150 mg, dexamethasone (DECADRON) 12 mg in sodium chloride 0.9 % 145 mL  IVPB, , Intravenous,  Once, 1 of 4 cycles Administration:  (02/05/2018) atezolizumab (TECENTRIQ) 1,200 mg in sodium chloride 0.9 % 250 mL chemo infusion, 1,200 mg, Intravenous, Once, 1 of 8 cycles Administration: 1,200 mg (02/05/2018)  for chemotherapy treatment.      ALLERGIES:  is allergic to penicillins and tessalon [benzonatate].  MEDICATIONS:  Current Facility-Administered Medications  Medication Dose Route Frequency Provider Last Rate Last Dose  . acetaminophen (TYLENOL) tablet 650 mg  650 mg Oral Q6H PRN Gouru, Aruna, MD       Or  . acetaminophen (TYLENOL) suppository 650 mg  650 mg Rectal Q6H PRN Gouru, Aruna, MD      . albuterol (PROVENTIL) (2.5 MG/3ML) 0.083% nebulizer solution 2.5 mL  2.5 mL Inhalation Q6H PRN Gouru, Aruna, MD      . amitriptyline (ELAVIL) tablet 50 mg  50 mg Oral QHS Gouru, Illene Silver, MD   50 mg at 02/11/18 2043  . aspirin EC tablet 81 mg  81 mg Oral Daily Gouru, Aruna, MD      . dexamethasone (DECADRON) tablet 4 mg  4 mg Oral Daily Gouru, Aruna, MD      . dronabinol (MARINOL) capsule 5 mg  5 mg Oral BID AC Gouru, Aruna, MD   5 mg at 02/12/18 0749  . enoxaparin (LOVENOX) injection 40 mg  40 mg Subcutaneous Q24H Gouru, Aruna, MD   40 mg at 02/12/18 0022  . gabapentin (NEURONTIN) capsule 100 mg  100 mg Oral BID Borders, Kirt Boys, NP      . HYDROmorphone (DILAUDID) injection 1 mg  1 mg Intravenous Q4H PRN Gouru, Aruna, MD   1 mg at 02/12/18 0747  . LORazepam (ATIVAN) injection 0.5 mg  0.5 mg Intravenous Q4H PRN Gouru, Aruna, MD      . MEDLINE mouth rinse  15 mL Mouth Rinse BID Gouru, Aruna, MD      . meloxicam (MOBIC) tablet 15 mg  15 mg Oral Daily Gouru, Aruna, MD      . mometasone-formoterol (DULERA) 200-5 MCG/ACT inhaler 2 puff  2 puff Inhalation BID Nicholes Mango, MD   2 puff at 02/11/18 2044  . ondansetron (ZOFRAN) tablet 4 mg  4 mg Oral Q6H PRN Gouru, Aruna, MD       Or  . ondansetron (ZOFRAN) injection 4 mg  4 mg Intravenous Q6H PRN Gouru, Aruna, MD      .  oxyCODONE (Oxy IR/ROXICODONE) immediate release tablet 5-10 mg  5-10 mg Oral Q3H PRN Borders, Kirt Boys, NP      . oxyCODONE (OXYCONTIN) 12 hr tablet 15 mg  15 mg Oral Q8H Borders, Joshua R, NP      . pantoprazole (PROTONIX) EC tablet 20 mg  20 mg Oral Daily Borders, Joshua R, NP      . polyethylene glycol (MIRALAX / GLYCOLAX) packet 17 g  17 g Oral BID PRN Gouru, Aruna, MD      . prochlorperazine (COMPAZINE) tablet 10 mg  10 mg Oral Q6H PRN Gouru, Aruna, MD      . tiotropium (SPIRIVA) inhalation capsule ([facility] use ONLY) 18 mcg  18 mcg Inhalation Daily Gouru, Aruna,  MD       Facility-Administered Medications Ordered in Other Encounters  Medication Dose Route Frequency Provider Last Rate Last Dose  . HYDROmorphone (DILAUDID) injection 1 mg  1 mg Intravenous Once Borders, Kirt Boys, NP        VITAL SIGNS: BP 130/68 (BP Location: Right Arm)   Pulse 86   Temp 98 F (36.7 C) (Oral)   Resp 20   Ht _0  (1.727 m)   SpO2 93%   BMI 19.22 kg/m  There were no vitals filed for this visit.  Estimated body mass index is 19.22 kg/m as calculated from the following:   Height as of this encounter: _1  (1.727 m).   Weight as of 02/08/18: 126 lb 6.4 oz (57.3 kg).  LABS: CBC:    Component Value Date/Time   WBC 6.3 02/12/2018 0538   HGB 9.7 (L) 02/12/2018 0538   HCT 29.0 (L) 02/12/2018 0538   PLT 129 (L) 02/12/2018 0538   MCV 86.6 02/12/2018 0538   NEUTROABS 12.9 (H) 02/08/2018 1130   LYMPHSABS 0.8 02/08/2018 1130   MONOABS 0.2 02/08/2018 1130   EOSABS 0.0 02/08/2018 1130   BASOSABS 0.0 02/08/2018 1130   Comprehensive Metabolic Panel:    Component Value Date/Time   NA 139 02/12/2018 0538   K 3.3 (L) 02/12/2018 0538   CL 106 02/12/2018 0538   CO2 27 02/12/2018 0538   BUN 18 02/12/2018 0538   CREATININE 0.62 02/12/2018 0538   GLUCOSE 86 02/12/2018 0538   CALCIUM 7.9 (L) 02/12/2018 0538   AST 21 02/12/2018 0538   ALT 11 02/12/2018 0538   ALKPHOS 76 02/12/2018 0538   BILITOT 0.8  02/12/2018 0538   PROT 5.7 (L) 02/12/2018 0538   ALBUMIN 3.0 (L) 02/12/2018 0538    RADIOGRAPHIC STUDIES: Dg Chest 2 View  Result Date: 01/20/2018 CLINICAL DATA:  Shortness of breath and left upper chest pain in a patient with lung carcinoma. EXAM: CHEST - 2 VIEW COMPARISON:  PET CT scan 01/11/2018. PA and lateral chest 09/01/2016. FINDINGS: The lungs are emphysematous. No consolidative process, pneumothorax or effusion. Mass lesion in the left mediastinum is identified as seen on the prior exams. Heart size is normal. No acute bony abnormality. Destructive lesion in the left transverse process of T2 is not visible on this exam. No acute bony abnormality is seen. IMPRESSION: No acute disease. Mediastinal mass lesion on the left as seen on prior exams. Emphysema. Electronically Signed   By: Inge Rise M.D.   On: 01/20/2018 14:40   Ct Head Wo Contrast  Result Date: 02/08/2018 CLINICAL DATA:  Acute right neck pain and headache beginning after radiation therapy yesterday. History of poorly differentiated neuroendocrine cancer. EXAM: CT HEAD WITHOUT CONTRAST CT CERVICAL SPINE WITHOUT CONTRAST TECHNIQUE: Multidetector CT imaging of the head and cervical spine was performed following the standard protocol without intravenous contrast. Multiplanar CT image reconstructions of the cervical spine were also generated. COMPARISON:  Brain MRI 01/09/2018.  PET-CT 01/11/2018. FINDINGS: CT HEAD FINDINGS Brain: There is no evidence of acute infarct, intracranial hemorrhage, mass, midline shift, or extra-axial fluid collection. The ventricles and sulci are within normal limits for age. Scattered cerebral white matter hypodensities are nonspecific but compatible with mild chronic small vessel ischemic disease. Vascular: Calcified atherosclerosis at the skull base. No hyperdense vessel. Skull: 3.5 cm destructive skull base lesion involving the right occipital condyle, petrous ridge, and clivus, increased in size from  the prior MRI. Scattered subcentimeter lesions elsewhere in the skull.  Sinuses/Orbits: Visualized paranasal sinuses and mastoid air cells are clear. Bilateral cataract extraction is noted. Other: None. CT CERVICAL SPINE FINDINGS Alignment: Slight right convex curvature of the cervical spine. No significant listhesis. Skull base and vertebrae: Right-sided skull base lesion as above. 4 cm destructive mass involving the left-sided posterior elements at T2. 1 cm lytic lesion in the C7 vertebral body. No acute fracture Soft tissues and spinal canal: Left-sided epidural tumor at T2 without evidence of high-grade spinal stenosis. No prevertebral swelling. Disc levels: Moderate disc space narrowing and degenerative endplate spurring from L5-7 to C7-T1 with moderate to severe multilevel neural foraminal stenosis. Upper chest: Centrilobular emphysema in the lung apices. Other: Carotid artery atherosclerosis. IMPRESSION: 1. No evidence of acute intracranial abnormality. 2. Osseous metastatic disease with significant interval enlargement of a destructive right skull base mass. 3. No acute osseous abnormality identified in the cervical spine. 4. 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 5. Metastasis involving the T2 posterior elements with left lateral epidural tumor. Electronically Signed   By: Logan Bores M.D.   On: 02/08/2018 13:09   Ct Angio Chest Pe W And/or Wo Contrast  Result Date: 01/20/2018 CLINICAL DATA:  Shortness of breath and left chest pain today. History of lung cancer. EXAM: CT ANGIOGRAPHY CHEST WITH CONTRAST TECHNIQUE: Multidetector CT imaging of the chest was performed using the standard protocol during bolus administration of intravenous contrast. Multiplanar CT image reconstructions and MIPs were obtained to evaluate the vascular anatomy. CONTRAST:  75 mL OMNIPAQUE IOHEXOL 350 MG/ML SOLN COMPARISON:  PET CT scan 01/11/2018. FINDINGS: Cardiovascular:  Satisfactory opacification of the pulmonary arteries to the segmental level. No evidence of pulmonary embolism. Normal heart size. No pericardial effusion. Mediastinum/Nodes: Again seen is a mass lesion centered in the mediastinum on the left which measures 5.4 x 3.9 cm on image 43, unchanged. As noted on the prior examination, the lesion is centered in the AP window. It abuts the right main pulmonary artery without invading it. The mass also surrounds the interlobar branch to the anterior left upper lobe. No right hilar lymphadenopathy is identified. The esophagus and thyroid gland appear normal. Lungs/Pleura: The patient has a small left pleural effusion which is new since the prior exam. The lungs demonstrate extensive centrilobular emphysema. A left upper lobe nodule on image 42 measuring 0.7 cm is unchanged. There also a few scattered ground-glass attenuation opacities in the anterior left upper lobe which are unchanged. The right lung is clear. Upper Abdomen: No acute abnormality. Cyst in the right kidney noted. Musculoskeletal: Destructive lesion in the left T2 transverse process is identified and measures approximately 3.5 x 2.0 cm on image 11 of series 7, not notably changed. Also seen is a large destructive lesion in the left side of the T12 vertebral body extending into the left pedicle, unchanged. No new bony abnormality is identified. Review of the MIP images confirms the above findings. IMPRESSION: Negative for pulmonary embolus or acute cardiopulmonary disease. Small left pleural effusion is new since the prior exam. No change in a left mediastinal mass lesion with metastatic deposits in the left side of T12 and left transverse process of T2. Aortic Atherosclerosis (ICD10-I70.0) and Severe emphysema (ICD10-J43.9). Electronically Signed   By: Inge Rise M.D.   On: 01/20/2018 16:40   Ct Cervical Spine Wo Contrast  Result Date: 02/08/2018 CLINICAL DATA:  Acute right neck pain and headache  beginning after radiation therapy yesterday. History of poorly differentiated neuroendocrine cancer. EXAM: CT HEAD  WITHOUT CONTRAST CT CERVICAL SPINE WITHOUT CONTRAST TECHNIQUE: Multidetector CT imaging of the head and cervical spine was performed following the standard protocol without intravenous contrast. Multiplanar CT image reconstructions of the cervical spine were also generated. COMPARISON:  Brain MRI 01/09/2018.  PET-CT 01/11/2018. FINDINGS: CT HEAD FINDINGS Brain: There is no evidence of acute infarct, intracranial hemorrhage, mass, midline shift, or extra-axial fluid collection. The ventricles and sulci are within normal limits for age. Scattered cerebral white matter hypodensities are nonspecific but compatible with mild chronic small vessel ischemic disease. Vascular: Calcified atherosclerosis at the skull base. No hyperdense vessel. Skull: 3.5 cm destructive skull base lesion involving the right occipital condyle, petrous ridge, and clivus, increased in size from the prior MRI. Scattered subcentimeter lesions elsewhere in the skull. Sinuses/Orbits: Visualized paranasal sinuses and mastoid air cells are clear. Bilateral cataract extraction is noted. Other: None. CT CERVICAL SPINE FINDINGS Alignment: Slight right convex curvature of the cervical spine. No significant listhesis. Skull base and vertebrae: Right-sided skull base lesion as above. 4 cm destructive mass involving the left-sided posterior elements at T2. 1 cm lytic lesion in the C7 vertebral body. No acute fracture Soft tissues and spinal canal: Left-sided epidural tumor at T2 without evidence of high-grade spinal stenosis. No prevertebral swelling. Disc levels: Moderate disc space narrowing and degenerative endplate spurring from K0-9 to C7-T1 with moderate to severe multilevel neural foraminal stenosis. Upper chest: Centrilobular emphysema in the lung apices. Other: Carotid artery atherosclerosis. IMPRESSION: 1. No evidence of acute  intracranial abnormality. 2. Osseous metastatic disease with significant interval enlargement of a destructive right skull base mass. 3. No acute osseous abnormality identified in the cervical spine. 4. 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 5. Metastasis involving the T2 posterior elements with left lateral epidural tumor. Electronically Signed   By: Logan Bores M.D.   On: 02/08/2018 13:09   Ct Biopsy  Result Date: 01/22/2018 INDICATION: Lytic bone lesions and lung mass EXAM: CT BIOPSY MEDICATIONS: None. ANESTHESIA/SEDATION: Fentanyl 100 mcg IV; Versed 1 mg IV Moderate Sedation Time:  6 minutes The patient was continuously monitored during the procedure by the interventional radiology nurse under my direct supervision. FLUOROSCOPY TIME:  Fluoroscopy Time:  minutes  seconds ( mGy). COMPLICATIONS: None immediate. PROCEDURE: Informed written consent was obtained from the patient after a thorough discussion of the procedural risks, benefits and alternatives. All questions were addressed. Maximal Sterile Barrier Technique was utilized including caps, mask, sterile gowns, sterile gloves, sterile drape, hand hygiene and skin antiseptic. A timeout was performed prior to the initiation of the procedure. Under CT guidance, a(n) 17 gauge guide needle was advanced into the right iliac bone lesion. Subsequently, 3 18 gauge core biopsies were obtained. The guide needle was removed. Post biopsy images demonstrate no hemorrhage. Patient tolerated the procedure well without complication. Vital sign monitoring by nursing staff during the procedure will continue as patient is in the special procedures unit for post procedure observation. FINDINGS: The images document guide needle placement within the right iliac bone lesion. Post biopsy images demonstrate no hemorrhage. IMPRESSION: Successful CT-guided core biopsy of a right iliac bone lesion. Electronically Signed   By: Marybelle Killings M.D.   On: 01/22/2018 12:48    PERFORMANCE STATUS (ECOG) : 2 - Symptomatic, <50% confined to bed  Review of Systems As noted above. Otherwise, a complete review of systems is negative.  Physical Exam General: NAD, frail appearing, thin HEENT: Bilateral temporal wasting Cardiovascular: regular rate and rhythm  Pulmonary: clear ant fields Abdomen: soft, nontender, + bowel sounds Extremities: no edema Skin: no rashes Neurological: Weakness but otherwise nonfocal  IMPRESSION: Patient admitted yesterday with intractable pain. Overnight, he has received 39m IV hydromorphone. Patient says that his pain was better but is starting to return. He is pending simulation study for XRT this morning. Will give additional dose of hydromorphone prior to him going for study.   Patient describes pain as radiating across the base of his skull over to the R. Temporal area. Pain is dull, throbbing, and burning. Pain is still severe at 10/10.   Will increase OxyContin 10 175mQ8H as patient was describing end dose failure. Will switch from Norco to oxycodone IR for BTP. Will increase frequency of gabapentin to BID as there is likely a neuropathic component. Start PPI for prophylaxis of GI related adverse event from NSAIDs. Continue IV hydromorphone for now until oral regimen is stable.   Met with wife this morning. She plans to speak with patient about his wishes for code status and future treatment. She is interested in speaking with oncology about their input.   PLAN: Continue supportive care Plan for XRT later in the week per Dr. CrDonella Stadencrease frequency of OxyContin to 1549m8H Increase frequency of gabapentin to 100m46mD Start oxycodone 5-10mg76m prn for BTP Continue to titrate oral regimen until pain is stable Continue dexamethasone and Mobic Start PPI   Patient expressed understanding and was in agreement with this plan. He also understands that He can call clinic at any time with any  questions, concerns, or complaints.    Time Total: 45 minutes  Visit consisted of counseling and education dealing with the complex and emotionally intense issues of symptom management and palliative care in the setting of serious and potentially life-threatening illness.Greater than 50%  of this time was spent counseling and coordinating care related to the above assessment and plan.  Signed by: JoshuAltha Harm, NP-C [phone] Cell)

## 2018-02-12 NOTE — Progress Notes (Signed)
Cbc and cmp drawn from port site

## 2018-02-12 NOTE — Consult Note (Signed)
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  He was given first cycle of carboplatin and etoposide and Tecentriq on 02/05/2018.  Head MRI also showed aggressive bone lesion centered in the right occipital condyle with hypoglossal canal involvement and right tongue denervation.  No brain metastases.  He was noted noted to have increasing pain in his right base of the skull on 02/07/2018 after chemotherapy and was recently admitted to hospital for pain control.  Head and neck CT on 02/08/2018 did not show significant interval enlargement of the destructive right skull base mass.  Patient has been seen by radiation oncology and is undergoing simulation today and will start palliative radiation in 2 days time.  Patient has now been readmitted to the hospital for poor pain control.  He is currently on OxyContin and oxycodone as well as gabapentin and dexamethasone.  Patients wife at bedside. Patient is resting comfortably. She reports he had a good morning today. He ate his breakfast and read his morning paper.    Review of systems- Review of Systems  Constitutional: Positive for malaise/fatigue. Negative for chills, fever and weight loss.  HENT: Negative for congestion, ear discharge and nosebleeds.   Eyes: Negative for blurred vision.  Respiratory: Negative for cough, hemoptysis, sputum  production, shortness of breath and wheezing.   Cardiovascular: Negative for chest pain, palpitations, orthopnea and claudication.  Gastrointestinal: Negative for abdominal pain, blood in stool, constipation, diarrhea, heartburn, melena, nausea and vomiting.  Genitourinary: Negative for dysuria, flank pain, frequency, hematuria and urgency.  Musculoskeletal: Positive for neck pain. Negative for back pain, joint pain and myalgias.  Skin: Negative for rash.  Neurological: Negative for dizziness, tingling, focal weakness, seizures, weakness and headaches.  Endo/Heme/Allergies: Does not bruise/bleed easily.  Psychiatric/Behavioral: Negative for depression and suicidal ideas. The patient does not have insomnia.     Allergies  Allergen Reactions  . Penicillins Itching, Swelling and Rash    DID THE REACTION INVOLVE: Swelling of the face/tongue/throat, SOB, or low BP? Yes Sudden or severe rash/hives, skin peeling, or the inside of the mouth or nose? Unknown Did it require medical treatment? Yes When did it last happen?30+ years If all above answers are "NO", may proceed with cephalosporin use.   Lavella Lemons [Benzonatate] Swelling    Had wide-spread numbness, throat swelling    Patient Active Problem List   Diagnosis Date Noted  . Palliative care encounter   . Intractable pain 02/11/2018  . Neck pain 02/08/2018  . Hypercalcemia 02/05/2018  . Early satiety 02/05/2018  . Neoplasm related pain 02/05/2018  . Thrush 02/05/2018  . Neuroendocrine cancer ([place]) 01/28/2018  . Goals of care, counseling/discussion 01/19/2018  . Bone lesion 01/01/2018  . Anorexia 12/20/2017  . 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 Sciatica 11/27/2017  . Carotid stenosis 04/22/2017  . Chronic obstructive pulmonary disease ([place]) 09/03/2016  . COPD exacerbation ([place]) 01/09/2016  . Colon cancer screening 11/11/2015  . Right hand pain 11/11/2015  .  Cough 03/03/2015  . Weight loss 01/06/2015  . Dysuria  10/29/2014  . S/P lumbar microdiscectomy 10/14/2014  . Advance care planning 05/27/2014  . Iliotibial band syndrome 04/07/2014  . CAD (coronary artery disease), native coronary artery 06/11/2013  . Decreased exercise tolerance 06/10/2013  . Nodule of right lung 10/25/2011  . Emphysema lung ([place]) 07/20/2011  . Medicare annual wellness visit, initial 07/20/2011     Past Medical History:  Diagnosis Date  . Cancer ([place])   . Carotid arterial disease ([place])   . Carotid artery disease ([place])   . Chickenpox   . Colon polyps   . COPD (chronic obstructive pulmonary disease) ([place])   . Hyperlipidemia   . Neuroendocrine cancer ([place]) 01/28/2018  . Normal cardiac stress test    06/2013  . Shortness of breath dyspnea   . Urinary frequency   . Urinary hesitancy      Past Surgical History:  Procedure Laterality Date  . CATARACT EXTRACTION, BILATERAL    . COLONOSCOPY    . ESOPHAGOGASTRODUODENOSCOPY    . HERNIA REPAIR     right inquinal - as child  . LUMBAR LAMINECTOMY/DECOMPRESSION MICRODISCECTOMY Left 10/14/2014   Procedure: Left Lumbar three-four extraforaminla microdiskectomy;  Surgeon: Eustace Moore, MD;  Location: [facility];  Service: Neurosurgery;  Laterality: Left;  Left L34 extraforaminal microdiskectomy  . POLYPECTOMY    . PORTA CATH INSERTION N/A 01/31/2018   Procedure: PORTA CATH INSERTION;  Surgeon: Algernon Huxley, MD;  Location: [facility];  Service: Cardiovascular;  Laterality: N/A;  . TONSILLECTOMY     remote - childhood  . UPPER GASTROINTESTINAL ENDOSCOPY      Social History   Socioeconomic History  . Marital status: Married    Spouse name: Not on file  . Number of children: 5  . Years of education: 67  . Highest education level: Not on file  Occupational History  . Occupation: mfg Librarian, academic    Comment: retired  Scientific laboratory technician  . Financial resource strain: Patient refused  . Food insecurity:    Worry: Patient refused    Inability: Patient refused  .  Transportation needs:    Medical: Patient refused    Non-medical: Patient refused  Tobacco Use  . Smoking status: Former Smoker    Packs/day: 0.50    Years: 40.00    Pack years: 20.00    Last attempt to quit: 07/20/1999    Years since quitting: 18.5  . Smokeless tobacco: Never Used  Substance and Sexual Activity  . Alcohol use: No    Alcohol/week: 0.0 standard drinks  . Drug use: No  . Sexual activity: Yes    Birth control/protection: Post-menopausal  Lifestyle  . Physical activity:    Days per week: Patient refused    Minutes per session: Patient refused  . Stress: Patient refused  Relationships  . Social connections:    Talks on phone: Patient refused    Gets together: Patient refused    Attends religious service: Patient refused    Active member of club or organization: Patient refused    Attends meetings of clubs or organizations: Patient refused    Relationship status: Patient refused  . Intimate partner violence:    Fear of current or ex partner: Patient refused    Emotionally abused: Patient refused    Physically abused: Patient refused    Forced sexual activity: Patient refused  Other Topics Concern  . Not on file  Social History Narrative   HSG. Married '63.  Previous marriage - 3 years/divorce. 5 sons. 7 grandchildren.   Work - Financial planner - retired.    Active in community and in his church   [employer] fan     Family History  Problem Relation Age of Onset  . Heart disease Mother        CABG; Valve replacement - died post-op  . Alzheimer's disease Father   . Diabetes Father   . Diabetes Son   . Colon cancer Sister   . Prostate cancer Neg Hx   . Colon polyps Neg Hx      Current Facility-Administered Medications:  .  acetaminophen (TYLENOL) tablet 650 mg, 650 mg, Oral, Q6H PRN **OR** acetaminophen (TYLENOL) suppository 650 mg, 650 mg, Rectal, Q6H PRN, Gouru, Aruna, MD .  albuterol (PROVENTIL) (2.5 MG/3ML) 0.083% nebulizer solution 2.5 mL, 2.5 mL,  Inhalation, Q6H PRN, Gouru, Aruna, MD .  amitriptyline (ELAVIL) tablet 50 mg, 50 mg, Oral, QHS, Gouru, Aruna, MD, 50 mg at 02/11/18 2043 .  aspirin EC tablet 81 mg, 81 mg, Oral, Daily, Gouru, Aruna, MD, 81 mg at 02/12/18 1029 .  dexamethasone (DECADRON) tablet 4 mg, 4 mg, Oral, Daily, Gouru, Aruna, MD, 4 mg at 02/12/18 1029 .  dronabinol (MARINOL) capsule 5 mg, 5 mg, Oral, BID AC, Gouru, Aruna, MD, 5 mg at 02/12/18 0749 .  enoxaparin (LOVENOX) injection 40 mg, 40 mg, Subcutaneous, Q24H, Gouru, Aruna, MD, 40 mg at 02/12/18 0022 .  gabapentin (NEURONTIN) capsule 100 mg, 100 mg, Oral, BID, Borders, Vonna Kotyk R, NP, 100 mg at 02/12/18 1029 .  HYDROmorphone (DILAUDID) injection 1 mg, 1 mg, Intravenous, Q4H PRN, Gouru, Aruna, MD, 1 mg at 02/12/18 0747 .  LORazepam (ATIVAN) injection 0.5 mg, 0.5 mg, Intravenous, Q4H PRN, Gouru, Aruna, MD .  MEDLINE mouth rinse, 15 mL, Mouth Rinse, BID, Gouru, Aruna, MD, 15 mL at 02/12/18 1029 .  meloxicam (MOBIC) tablet 15 mg, 15 mg, Oral, Daily, Gouru, Aruna, MD, 15 mg at 02/12/18 1029 .  mometasone-formoterol (DULERA) 200-5 MCG/ACT inhaler 2 puff, 2 puff, Inhalation, BID, Gouru, Aruna, MD, 2 puff at 02/12/18 1030 .  ondansetron (ZOFRAN) tablet 4 mg, 4 mg, Oral, Q6H PRN **OR** ondansetron (ZOFRAN) injection 4 mg, 4 mg, Intravenous, Q6H PRN, Gouru, Aruna, MD .  oxyCODONE (Oxy IR/ROXICODONE) immediate release tablet 5-10 mg, 5-10 mg, Oral, Q3H PRN, Borders, Vonna Kotyk R, NP .  oxyCODONE (OXYCONTIN) 12 hr tablet 15 mg, 15 mg, Oral, Q8H, Borders, Joshua R, NP .  pantoprazole (PROTONIX) EC tablet 20 mg, 20 mg, Oral, Daily, Borders, Joshua R, NP, 20 mg at 02/12/18 1042 .  polyethylene glycol (MIRALAX / GLYCOLAX) packet 17 g, 17 g, Oral, BID PRN, Gouru, Aruna, MD .  prochlorperazine (COMPAZINE) tablet 10 mg, 10 mg, Oral, Q6H PRN, Gouru, Aruna, MD .  tiotropium (SPIRIVA) inhalation capsule ([facility] use ONLY) 18 mcg, 18 mcg, Inhalation, Daily, Gouru, Aruna, MD, 18 mcg at 02/12/18  1029  Facility-Administered Medications Ordered in Other Encounters:  .  HYDROmorphone (DILAUDID) injection 1 mg, 1 mg, Intravenous, Once, Borders, Kirt Boys, NP   Physical exam:  Vitals:   02/11/18 1512 02/11/18 1807 02/11/18 1917 02/12/18 0443  BP: 134/70  123/65 130/68  Pulse: 95  87 86  Resp: 20  20 20   Temp: (!) 97.4 F (36.3 C)  97.8 F (36.6 C) 98 F (36.7 C)  TempSrc: Oral  Oral Oral  SpO2: 93%  93% 93%  Height:  5\' 8"  (1.727 m)     Physical Exam Constitutional:  Comments: Resting comfortably. Eyes closed. Thin and fatigued  HENT:     Head: Normocephalic and atraumatic.  Eyes:     Pupils: Pupils are equal, round, and reactive to light.  Neck:     Musculoskeletal: Normal range of motion.  Cardiovascular:     Rate and Rhythm: Normal rate and regular rhythm.     Heart sounds: Normal heart sounds.  Pulmonary:     Effort: Pulmonary effort is normal.     Breath sounds: Normal breath sounds.  Abdominal:     General: Bowel sounds are normal.     Palpations: Abdomen is soft.  Skin:    General: Skin is warm and dry.  Neurological:     General: No focal deficit present.        CMP Latest Ref Rng & Units 02/12/2018  Glucose 70 - 99 mg/dL 86  BUN 8 - 23 mg/dL 18  Creatinine 0.61 - 1.24 mg/dL 0.62  Sodium 135 - 145 mmol/L 139  Potassium 3.5 - 5.1 mmol/L 3.3(L)  Chloride 98 - 111 mmol/L 106  CO2 22 - 32 mmol/L 27  Calcium 8.9 - 10.3 mg/dL 7.9(L)  Total Protein 6.5 - 8.1 g/dL 5.7(L)  Total Bilirubin 0.3 - 1.2 mg/dL 0.8  Alkaline Phos 38 - 126 U/L 76  AST 15 - 41 U/L 21  ALT 0 - 44 U/L 11   CBC Latest Ref Rng & Units 02/12/2018  WBC 4.0 - 10.5 K/uL 6.3  Hemoglobin 13.0 - 17.0 g/dL 9.7(L)  Hematocrit 39.0 - 52.0 % 29.0(L)  Platelets 150 - 400 K/uL 129(L)    @IMAGES @  Dg Chest 2 View  Result Date: 01/20/2018 CLINICAL DATA:  Shortness of breath and left upper chest pain in a patient with lung carcinoma. EXAM: CHEST - 2 VIEW COMPARISON:  PET CT scan  01/11/2018. PA and lateral chest 09/01/2016. FINDINGS: The lungs are emphysematous. No consolidative process, pneumothorax or effusion. Mass lesion in the left mediastinum is identified as seen on the prior exams. Heart size is normal. No acute bony abnormality. Destructive lesion in the left transverse process of T2 is not visible on this exam. No acute bony abnormality is seen. IMPRESSION: No acute disease. Mediastinal mass lesion on the left as seen on prior exams. Emphysema. Electronically Signed   By: Inge Rise M.D.   On: 01/20/2018 14:40   Ct Head Wo Contrast  Result Date: 02/08/2018 CLINICAL DATA:  Acute right neck pain and headache beginning after radiation therapy yesterday. History of poorly differentiated neuroendocrine cancer. EXAM: CT HEAD WITHOUT CONTRAST CT CERVICAL SPINE WITHOUT CONTRAST TECHNIQUE: Multidetector CT imaging of the head and cervical spine was performed following the standard protocol without intravenous contrast. Multiplanar CT image reconstructions of the cervical spine were also generated. COMPARISON:  Brain MRI 01/09/2018.  PET-CT 01/11/2018. FINDINGS: CT HEAD FINDINGS Brain: There is no evidence of acute infarct, intracranial hemorrhage, mass, midline shift, or extra-axial fluid collection. The ventricles and sulci are within normal limits for age. Scattered cerebral white matter hypodensities are nonspecific but compatible with mild chronic small vessel ischemic disease. Vascular: Calcified atherosclerosis at the skull base. No hyperdense vessel. Skull: 3.5 cm destructive skull base lesion involving the right occipital condyle, petrous ridge, and clivus, increased in size from the prior MRI. Scattered subcentimeter lesions elsewhere in the skull. Sinuses/Orbits: Visualized paranasal sinuses and mastoid air cells are clear. Bilateral cataract extraction is noted. Other: None. CT CERVICAL SPINE FINDINGS Alignment: Slight right convex curvature of the cervical spine. No  significant listhesis. Skull base and vertebrae: Right-sided skull base lesion as above. 4 cm destructive mass involving the left-sided posterior elements at T2. 1 cm lytic lesion in the C7 vertebral body. No acute fracture Soft tissues and spinal canal: Left-sided epidural tumor at T2 without evidence of high-grade spinal stenosis. No prevertebral swelling. Disc levels: Moderate disc space narrowing and degenerative endplate spurring from J6-9 to C7-T1 with moderate to severe multilevel neural foraminal stenosis. Upper chest: Centrilobular emphysema in the lung apices. Other: Carotid artery atherosclerosis. IMPRESSION: 1. No evidence of acute intracranial abnormality. 2. Osseous metastatic disease with significant interval enlargement of a destructive right skull base mass. 3. No acute osseous abnormality identified in the cervical spine. 4. 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 5. Metastasis involving the T2 posterior elements with left lateral epidural tumor. Electronically Signed   By: Logan Bores M.D.   On: 02/08/2018 13:09   Ct Angio Chest Pe W And/or Wo Contrast  Result Date: 01/20/2018 CLINICAL DATA:  Shortness of breath and left chest pain today. History of lung cancer. EXAM: CT ANGIOGRAPHY CHEST WITH CONTRAST TECHNIQUE: Multidetector CT imaging of the chest was performed using the standard protocol during bolus administration of intravenous contrast. Multiplanar CT image reconstructions and MIPs were obtained to evaluate the vascular anatomy. CONTRAST:  75 mL OMNIPAQUE IOHEXOL 350 MG/ML SOLN COMPARISON:  PET CT scan 01/11/2018. FINDINGS: Cardiovascular: Satisfactory opacification of the pulmonary arteries to the segmental level. No evidence of pulmonary embolism. Normal heart size. No pericardial effusion. Mediastinum/Nodes: Again seen is a mass lesion centered in the mediastinum on the left which measures 5.4 x 3.9 cm on image 43, unchanged. As noted  on the prior examination, the lesion is centered in the AP window. It abuts the right main pulmonary artery without invading it. The mass also surrounds the interlobar branch to the anterior left upper lobe. No right hilar lymphadenopathy is identified. The esophagus and thyroid gland appear normal. Lungs/Pleura: The patient has a small left pleural effusion which is new since the prior exam. The lungs demonstrate extensive centrilobular emphysema. A left upper lobe nodule on image 42 measuring 0.7 cm is unchanged. There also a few scattered ground-glass attenuation opacities in the anterior left upper lobe which are unchanged. The right lung is clear. Upper Abdomen: No acute abnormality. Cyst in the right kidney noted. Musculoskeletal: Destructive lesion in the left T2 transverse process is identified and measures approximately 3.5 x 2.0 cm on image 11 of series 7, not notably changed. Also seen is a large destructive lesion in the left side of the T12 vertebral body extending into the left pedicle, unchanged. No new bony abnormality is identified. Review of the MIP images confirms the above findings. IMPRESSION: Negative for pulmonary embolus or acute cardiopulmonary disease. Small left pleural effusion is new since the prior exam. No change in a left mediastinal mass lesion with metastatic deposits in the left side of T12 and left transverse process of T2. Aortic Atherosclerosis (ICD10-I70.0) and Severe emphysema (ICD10-J43.9). Electronically Signed   By: Inge Rise M.D.   On: 01/20/2018 16:40   Ct Cervical Spine Wo Contrast  Result Date: 02/08/2018 CLINICAL DATA:  Acute right neck pain and headache beginning after radiation therapy yesterday. History of poorly differentiated neuroendocrine cancer. EXAM: CT HEAD WITHOUT CONTRAST CT CERVICAL SPINE WITHOUT CONTRAST TECHNIQUE: Multidetector CT imaging of the head and cervical spine was performed following the standard protocol without intravenous  contrast. Multiplanar CT image reconstructions of  the cervical spine were also generated. COMPARISON:  Brain MRI 01/09/2018.  PET-CT 01/11/2018. FINDINGS: CT HEAD FINDINGS Brain: There is no evidence of acute infarct, intracranial hemorrhage, mass, midline shift, or extra-axial fluid collection. The ventricles and sulci are within normal limits for age. Scattered cerebral white matter hypodensities are nonspecific but compatible with mild chronic small vessel ischemic disease. Vascular: Calcified atherosclerosis at the skull base. No hyperdense vessel. Skull: 3.5 cm destructive skull base lesion involving the right occipital condyle, petrous ridge, and clivus, increased in size from the prior MRI. Scattered subcentimeter lesions elsewhere in the skull. Sinuses/Orbits: Visualized paranasal sinuses and mastoid air cells are clear. Bilateral cataract extraction is noted. Other: None. CT CERVICAL SPINE FINDINGS Alignment: Slight right convex curvature of the cervical spine. No significant listhesis. Skull base and vertebrae: Right-sided skull base lesion as above. 4 cm destructive mass involving the left-sided posterior elements at T2. 1 cm lytic lesion in the C7 vertebral body. No acute fracture Soft tissues and spinal canal: Left-sided epidural tumor at T2 without evidence of high-grade spinal stenosis. No prevertebral swelling. Disc levels: Moderate disc space narrowing and degenerative endplate spurring from P3-8 to C7-T1 with moderate to severe multilevel neural foraminal stenosis. Upper chest: Centrilobular emphysema in the lung apices. Other: Carotid artery atherosclerosis. IMPRESSION: 1. No evidence of acute intracranial abnormality. 2. Osseous metastatic disease with significant interval enlargement of a destructive right skull base mass. 3. No acute osseous abnormality identified in the cervical spine. 4. 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 5. Metastasis  involving the T2 posterior elements with left lateral epidural tumor. Electronically Signed   By: Logan Bores M.D.   On: 02/08/2018 13:09   Ct Biopsy  Result Date: 01/22/2018 INDICATION: Lytic bone lesions and lung mass EXAM: CT BIOPSY MEDICATIONS: None. ANESTHESIA/SEDATION: Fentanyl 100 mcg IV; Versed 1 mg IV Moderate Sedation Time:  6 minutes The patient was continuously monitored during the procedure by the interventional radiology nurse under my direct supervision. FLUOROSCOPY TIME:  Fluoroscopy Time:  minutes  seconds ( mGy). COMPLICATIONS: None immediate. PROCEDURE: Informed written consent was obtained from the patient after a thorough discussion of the procedural risks, benefits and alternatives. All questions were addressed. Maximal Sterile Barrier Technique was utilized including caps, mask, sterile gowns, sterile gloves, sterile drape, hand hygiene and skin antiseptic. A timeout was performed prior to the initiation of the procedure. Under CT guidance, a(n) 17 gauge guide needle was advanced into the right iliac bone lesion. Subsequently, 3 18 gauge core biopsies were obtained. The guide needle was removed. Post biopsy images demonstrate no hemorrhage. Patient tolerated the procedure well without complication. Vital sign monitoring by nursing staff during the procedure will continue as patient is in the special procedures unit for post procedure observation. FINDINGS: The images document guide needle placement within the right iliac bone lesion. Post biopsy images demonstrate no hemorrhage. IMPRESSION: Successful CT-guided core biopsy of a right iliac bone lesion. Electronically Signed   By: Marybelle Killings M.D.   On: 01/22/2018 12:48    Assessment and plan- Patient is a 77 y.o. male with metastatic neuroendocrine tumor mainly to the bones with enlarging skull base lesion causing intractable pain  Dr. Tasia Catchings who is the patient's primary oncologist is away this week. He has received only 1 cycle of  chemotherapy last week. Discussed with his wife that at this time it would be worthwhile to see if we can get his pain under better control and hopefully palliative  RT will help as well. Neuroendocrine tumors are typically chemo responsive. If his pain can be controlled, perhaps more chemotherapy in the future is a consideration. I would see how his symptoms are controlled over next 2 weeks before making decisions about hospice.  Neoplasm related pain- continue oxycontin 15 mg TID, prn oxycodone, prn dilaudid gabapentin and decadron and mobic at this time. I spoke to NP [employer] who has been managing his pain regimen. If his pain is not controlled with this regimen methadone or outpatient PCA (if possible) is a consideration.   Thank you for this kind referral and the opportunity to participate in the care of this patient   Visit Diagnosis 1. Intractable pain     Dr. Randa Evens, MD, MPH [facility] at [facility] [phone] 02/12/2018

## 2018-02-13 LAB — URINALYSIS, COMPLETE (UACMP) WITH MICROSCOPIC
BILIRUBIN URINE: NEGATIVE
Glucose, UA: NEGATIVE mg/dL
Ketones, ur: NEGATIVE mg/dL
Leukocytes, UA: NEGATIVE
Nitrite: NEGATIVE
Protein, ur: NEGATIVE mg/dL
Specific Gravity, Urine: 1.01 (ref 1.005–1.030)
Squamous Epithelial / HPF: NONE SEEN (ref 0–5)
pH: 6 (ref 5.0–8.0)

## 2018-02-13 MED ORDER — HYDROMORPHONE HCL 1 MG/ML IJ SOLN
1.0000 mg | Freq: Four times a day (QID) | INTRAMUSCULAR | Status: DC | PRN
  Administered 2018-02-14: 1 mg via INTRAVENOUS
  Filled 2018-02-13: qty 1

## 2018-02-13 NOTE — Progress Notes (Signed)
[facility] [place] NAME: Joseph Parker    MR#:  [id number]  DATE OF BIRTH:  1940/10/12  SUBJECTIVE:  CHIEF COMPLAINT: Came with significant pain not controlled by oral medications.  Increase the dose by palliative care nurse practitioner and patient feels slightly better today.  Still required 4-5 times IV Dilaudid in last 24 hours.  REVIEW OF SYSTEMS:  CONSTITUTIONAL: No fever, fatigue or weakness.  EYES: No blurred or double vision.  EARS, NOSE, AND THROAT: No tinnitus or ear pain.  RESPIRATORY: No cough, shortness of breath, wheezing or hemoptysis.  CARDIOVASCULAR: No chest pain, orthopnea, edema.  GASTROINTESTINAL: No nausea, vomiting, diarrhea or abdominal pain.  GENITOURINARY: No dysuria, hematuria.  ENDOCRINE: No polyuria, nocturia,  HEMATOLOGY: No anemia, easy bruising or bleeding SKIN: No rash or lesion. MUSCULOSKELETAL: No joint pain or arthritis.   NEUROLOGIC: No tingling, numbness, weakness.  PSYCHIATRY: No anxiety or depression.   ROS  DRUG ALLERGIES:   Allergies  Allergen Reactions  . Penicillins Itching, Swelling and Rash    DID THE REACTION INVOLVE: Swelling of the face/tongue/throat, SOB, or low BP? Yes Sudden or severe rash/hives, skin peeling, or the inside of the mouth or nose? Unknown Did it require medical treatment? Yes When did it last happen?30+ years If all above answers are "NO", may proceed with cephalosporin use.   Lavella Lemons [Benzonatate] Swelling    Had wide-spread numbness, throat swelling    VITALS:  Blood pressure (!) 153/84, pulse 94, temperature 97.8 F (36.6 C), temperature source Oral, resp. rate 20, height 5\' 8"  (1.727 m), SpO2 96 %.  PHYSICAL EXAMINATION:  GENERAL:  77 y.o.-year-old patient lying in the bed with no acute distress.  EYES: Pupils equal, round, reactive to light and accommodation. No scleral icterus. Extraocular muscles intact.  HEENT: Head atraumatic,  normocephalic. Oropharynx and nasopharynx clear.  NECK:  Supple, no jugular venous distention. No thyroid enlargement, no tenderness.  LUNGS: Normal breath sounds bilaterally, no wheezing, rales,rhonchi or crepitation. No use of accessory muscles of respiration.  CARDIOVASCULAR: S1, S2 normal. No murmurs, rubs, or gallops.  ABDOMEN: Soft, nontender, nondistended. Bowel sounds present. No organomegaly or mass.  EXTREMITIES: No pedal edema, cyanosis, or clubbing.  NEUROLOGIC: Cranial nerves II through XII are intact. Muscle strength 5/5 in all extremities. Sensation intact. Gait not checked.  PSYCHIATRIC: The patient is alert and oriented x 3.  SKIN: No obvious rash, lesion, or ulcer.   Physical Exam LABORATORY PANEL:   CBC Recent Labs  Lab 02/12/18 0538  WBC 6.3  HGB 9.7*  HCT 29.0*  PLT 129*   ------------------------------------------------------------------------------------------------------------------  Chemistries  Recent Labs  Lab 02/10/18 0846  02/12/18 0538  NA 140  --  139  K 4.0  --  3.3*  CL 110  --  106  CO2 24  --  27  GLUCOSE 99  --  86  BUN 21  --  18  CREATININE 0.64   < > 0.62  CALCIUM 7.8*  --  7.9*  MG 2.4  --   --   AST  --   --  21  ALT  --   --  11  ALKPHOS  --   --  76  BILITOT  --   --  0.8   < > = values in this interval not displayed.   ------------------------------------------------------------------------------------------------------------------  Cardiac Enzymes No results for input(s): TROPONINI in the last 168 hours. ------------------------------------------------------------------------------------------------------------------  RADIOLOGY:  No results found.  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 Palliative care had seen the patient and had increased OxyContin 15 mg 3 times a day along with short-acting oral meds for pain control and continue Dilaudid for breakthrough pain.  Encouraged to use oral medications with IV for breakthrough pain.  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  *COPDwithout exacerbation Stable Breathing treatments PRN  *Chronic hyperlipidemia Stable Discontinued statin therapy given concern for exacerbation of pain    All the records are reviewed and case discussed with Care Management/Social Workerr. Management plans discussed with the patient, family and they are in agreement.  CODE STATUS: full.  TOTAL TIME TAKING CARE OF THIS PATIENT: 35 minutes.    POSSIBLE D/C IN 1-2 DAYS, DEPENDING ON CLINICAL CONDITION.   Vaughan Basta M.D on 02/13/2018   Between 7am to 6pm - Pager - [phone]  After 6pm go to [email or web address] - password [email or web address] [facility]  Office  [phone]  CC: Primary care physician; Tonia Ghent, MD  Note: This dictation was prepared with Dragon dictation along with smaller phrase technology. Any transcriptional errors that result from this process are unintentional.

## 2018-02-14 ENCOUNTER — Ambulatory Visit
Admission: RE | Admit: 2018-02-14 | Discharge: 2018-02-14 | Disposition: A | Discharge status: Home / Self Care | Payer: Medicare Other | Source: Ambulatory Visit | Attending: Radiation Oncology | Admitting: Radiation Oncology

## 2018-02-14 DIAGNOSIS — Z87891 Personal history of nicotine dependence: Secondary | ICD-10-CM | POA: Diagnosis not present

## 2018-02-14 DIAGNOSIS — C7951 Secondary malignant neoplasm of bone: Secondary | ICD-10-CM | POA: Diagnosis not present

## 2018-02-14 DIAGNOSIS — C3490 Malignant neoplasm of unspecified part of unspecified bronchus or lung: Secondary | ICD-10-CM | POA: Diagnosis not present

## 2018-02-14 LAB — RETICULOCYTES
Immature Retic Fract: 6.6 % (ref 2.3–15.9)
RBC.: 3.24 MIL/uL — ABNORMAL LOW (ref 4.22–5.81)
Retic Count, Absolute: 7.1 10*3/uL — ABNORMAL LOW (ref 19.0–186.0)
Retic Ct Pct: 0.2 % — ABNORMAL LOW (ref 0.4–3.1)

## 2018-02-14 LAB — CBC
HCT: 27.5 % — ABNORMAL LOW (ref 39.0–52.0)
Hemoglobin: 9.5 g/dL — ABNORMAL LOW (ref 13.0–17.0)
MCH: 29.3 pg (ref 26.0–34.0)
MCHC: 34.5 g/dL (ref 30.0–36.0)
MCV: 84.9 fL (ref 80.0–100.0)
Platelets: 80 10*3/uL — ABNORMAL LOW (ref 150–400)
RBC: 3.24 MIL/uL — ABNORMAL LOW (ref 4.22–5.81)
RDW: 13.1 % (ref 11.5–15.5)
WBC: 0.6 10*3/uL — CL (ref 4.0–10.5)
nRBC: 0 % (ref 0.0–0.2)

## 2018-02-14 LAB — BASIC METABOLIC PANEL
Anion gap: 8 (ref 5–15)
BUN: 28 mg/dL — AB (ref 8–23)
CO2: 26 mmol/L (ref 22–32)
Calcium: 8.3 mg/dL — ABNORMAL LOW (ref 8.9–10.3)
Chloride: 100 mmol/L (ref 98–111)
Creatinine, Ser: 1.78 mg/dL — ABNORMAL HIGH (ref 0.61–1.24)
GFR calc Af Amer: 41 mL/min — ABNORMAL LOW (ref >60)
GFR calc non Af Amer: 36 mL/min — ABNORMAL LOW (ref >60)
Glucose, Bld: 131 mg/dL — ABNORMAL HIGH (ref 70–99)
POTASSIUM: 3.8 mmol/L (ref 3.5–5.1)
Sodium: 134 mmol/L — ABNORMAL LOW (ref 135–145)

## 2018-02-14 LAB — IRON AND TIBC
Iron: 47 ug/dL (ref 45–182)
SATURATION RATIOS: 20 % (ref 17.9–39.5)
TIBC: 241 ug/dL — ABNORMAL LOW (ref 250–450)
UIBC: 194 ug/dL

## 2018-02-14 LAB — VITAMIN B12: Vitamin B-12: 525 pg/mL (ref 180–914)

## 2018-02-14 LAB — FOLATE: Folate: 13 ng/mL (ref >5.9)

## 2018-02-14 LAB — FERRITIN: FERRITIN: 819 ng/mL — AB (ref 24–336)

## 2018-02-14 MED ORDER — GABAPENTIN 100 MG PO CAPS
100.0000 mg | ORAL_CAPSULE | Freq: Three times a day (TID) | ORAL | Status: DC
  Administered 2018-02-14 – 2018-02-16 (×7): 100 mg via ORAL
  Filled 2018-02-14 (×7): qty 1

## 2018-02-14 MED ORDER — ENOXAPARIN SODIUM 40 MG/0.4ML ~~LOC~~ SOLN: 40.0000 mg | SUBCUTANEOUS | Status: DC

## 2018-02-14 MED ORDER — ENOXAPARIN SODIUM 30 MG/0.3ML ~~LOC~~ SOLN
30.0000 mg | SUBCUTANEOUS | Status: DC
  Administered 2018-02-14 – 2018-02-15 (×2): 30 mg via SUBCUTANEOUS
  Filled 2018-02-14 (×2): qty 0.3

## 2018-02-14 MED ORDER — SODIUM CHLORIDE 0.9 % IV SOLN
INTRAVENOUS | Status: AC
  Administered 2018-02-14: 16:00:00 via INTRAVENOUS

## 2018-02-14 MED ORDER — OXYCODONE HCL ER 20 MG PO T12A
20.0000 mg | EXTENDED_RELEASE_TABLET | Freq: Three times a day (TID) | ORAL | Status: DC
  Administered 2018-02-14 – 2018-02-16 (×7): 20 mg via ORAL
  Filled 2018-02-14 (×7): qty 1

## 2018-02-14 NOTE — Progress Notes (Signed)
[place]  [phone] [phone]   Name: CAMRYN QUESINBERRY Date: 1August 02, 202019 MRN: [id number]  DOB: 1940-10-08  Patient Care Team: Tonia Ghent, MD as PCP - General (Family Medicine) Eustace Moore, MD as Consulting Physician (Neurosurgery) Telford Nab, RN as Registered Nurse    REASON FOR CONSULTATION: Singer consult requested for this 77 y.o. 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  He also has a history of pathologic fracture to right trochanter.  PMH also notable for COPD, CAD, carotid artery disease, and recent oral candidiasis.  Patient has been receiving treatment with carboplatin and etoposide and Tecentriq. He has also received XRT to SI joint/lumbar region. He was seen on 02/08/2018 by Dr. Tasia Catchings with intractable pain, which was unrelieved with multiple doses of hydromorphone and dexamethasone.  CT of the cervical spine and head revealed metastatic disease to T2 and enlargement of a destructive right skull mass. Patient was admitted for intractable pain and remained hospitalized 02/08/2018 to 02/10/2018 for same.  Pain ultimately improved with combination of OxyContin, PRN Norco, dexamethasone, gabapentin, and IV Toradol.  Patient was discharged home and was readmitted on 02/11/18 again with intractable pain.    CODE STATUS: Full Code  PAST MEDICAL HISTORY: Past Medical History:  Diagnosis Date  . Cancer ([place])   . Carotid arterial disease ([place])   . Carotid artery disease ([place])   . Chickenpox   . Colon polyps   . COPD (chronic obstructive pulmonary disease) ([place])   . Hyperlipidemia   . Neuroendocrine cancer ([place]) 01/28/2018  . Normal cardiac stress test    06/2013  . Shortness of breath dyspnea   . Urinary frequency   . Urinary hesitancy     PAST SURGICAL HISTORY:  Past Surgical  History:  Procedure Laterality Date  . CATARACT EXTRACTION, BILATERAL    . COLONOSCOPY    . ESOPHAGOGASTRODUODENOSCOPY    . HERNIA REPAIR     right inquinal - as child  . LUMBAR LAMINECTOMY/DECOMPRESSION MICRODISCECTOMY Left 10/14/2014   Procedure: Left Lumbar three-four extraforaminla microdiskectomy;  Surgeon: Eustace Moore, MD;  Location: [facility];  Service: Neurosurgery;  Laterality: Left;  Left L34 extraforaminal microdiskectomy  . POLYPECTOMY    . PORTA CATH INSERTION N/A 01/31/2018   Procedure: PORTA CATH INSERTION;  Surgeon: Algernon Huxley, MD;  Location: [facility];  Service: Cardiovascular;  Laterality: N/A;  . TONSILLECTOMY     remote - childhood  . UPPER GASTROINTESTINAL ENDOSCOPY      HEMATOLOGY/ONCOLOGY HISTORY:    Neuroendocrine cancer ([place])   01/28/2018 Initial Diagnosis    Neuroendocrine cancer ([place])    02/05/2018 -  Chemotherapy    The patient had palonosetron (ALOXI) injection 0.25 mg, 0.25 mg, Intravenous,  Once, 1 of 4 cycles Administration: 0.25 mg (02/05/2018) pegfilgrastim-cbqv (UDENYCA) injection 6 mg, 6 mg, Subcutaneous, Once, 1 of 4 cycles Administration: 6 mg (02/08/2018) CARBOplatin (PARAPLATIN) 360 mg in sodium chloride 0.9 % 250 mL chemo infusion, 370 mg (100 % of original dose 374 mg), Intravenous,  Once, 1 of 4 cycles Dose modification:   (original dose 374 mg, Cycle 1) Administration: 360 mg (02/05/2018) etoposide (VEPESID) 170 mg in sodium chloride 0.9 % 500 mL chemo infusion, 100 mg/m2 = 170 mg, Intravenous,  Once, 1 of 4 cycles Administration: 170 mg (02/05/2018), 170 mg (02/06/2018), 170 mg (02/07/2018) fosaprepitant (EMEND) 150  mg, dexamethasone (DECADRON) 12 mg in sodium chloride 0.9 % 145 mL IVPB, , Intravenous,  Once, 1 of 4 cycles Administration:  (02/05/2018) atezolizumab (TECENTRIQ) 1,200 mg in sodium chloride 0.9 % 250 mL chemo infusion, 1,200 mg, Intravenous, Once, 1 of 8 cycles Administration: 1,200 mg (02/05/2018)  for  chemotherapy treatment.      ALLERGIES:  is allergic to penicillins and tessalon [benzonatate].  MEDICATIONS:  Current Facility-Administered Medications  Medication Dose Route Frequency Provider Last Rate Last Dose  . acetaminophen (TYLENOL) tablet 650 mg  650 mg Oral Q6H PRN Gouru, Aruna, MD       Or  . acetaminophen (TYLENOL) suppository 650 mg  650 mg Rectal Q6H PRN Gouru, Aruna, MD      . albuterol (PROVENTIL) (2.5 MG/3ML) 0.083% nebulizer solution 2.5 mL  2.5 mL Inhalation Q6H PRN Gouru, Aruna, MD      . amitriptyline (ELAVIL) tablet 50 mg  50 mg Oral QHS Gouru, Illene Silver, MD   50 mg at 02/13/18 2046  . aspirin EC tablet 81 mg  81 mg Oral Daily Gouru, Aruna, MD   81 mg at 02/14/18 0758  . dexamethasone (DECADRON) tablet 4 mg  4 mg Oral Daily Gouru, Aruna, MD   4 mg at 02/14/18 0758  . dronabinol (MARINOL) capsule 5 mg  5 mg Oral BID AC Gouru, Aruna, MD   5 mg at 02/14/18 0758  . enoxaparin (LOVENOX) injection 40 mg  40 mg Subcutaneous Q24H Gouru, Aruna, MD   40 mg at 02/13/18 2047  . gabapentin (NEURONTIN) capsule 100 mg  100 mg Oral TID Derrick Orris, Kirt Boys, NP      . HYDROmorphone (DILAUDID) injection 1 mg  1 mg Intravenous Q6H PRN Vaughan Basta, MD   1 mg at 02/14/18 0748  . LORazepam (ATIVAN) injection 0.5 mg  0.5 mg Intravenous Q4H PRN Gouru, Aruna, MD      . MEDLINE mouth rinse  15 mL Mouth Rinse BID Gouru, Aruna, MD   15 mL at 02/14/18 0759  . meloxicam (MOBIC) tablet 15 mg  15 mg Oral Daily Gouru, Aruna, MD   15 mg at 02/14/18 0759  . mometasone-formoterol (DULERA) 200-5 MCG/ACT inhaler 2 puff  2 puff Inhalation BID Nicholes Mango, MD   2 puff at 02/14/18 0759  . ondansetron (ZOFRAN) tablet 4 mg  4 mg Oral Q6H PRN Gouru, Aruna, MD       Or  . ondansetron (ZOFRAN) injection 4 mg  4 mg Intravenous Q6H PRN Gouru, Aruna, MD      . oxyCODONE (Oxy IR/ROXICODONE) immediate release tablet 5-10 mg  5-10 mg Oral Q3H PRN Raynaldo Falco, Kirt Boys, NP   10 mg at 02/14/18 1018  . oxyCODONE  (OXYCONTIN) 12 hr tablet 20 mg  20 mg Oral Q8H Endre Coutts R, NP      . pantoprazole (PROTONIX) EC tablet 20 mg  20 mg Oral Daily Fidelis Loth, Kirt Boys, NP   20 mg at 02/14/18 0759  . polyethylene glycol (MIRALAX / GLYCOLAX) packet 17 g  17 g Oral BID PRN Gouru, Aruna, MD      . prochlorperazine (COMPAZINE) tablet 10 mg  10 mg Oral Q6H PRN Gouru, Aruna, MD      . senna (SENOKOT) tablet 8.6 mg  1 tablet Oral BID Vaughan Basta, MD   8.6 mg at 02/14/18 0758  . tiotropium (SPIRIVA) inhalation capsule ([facility] use ONLY) 18 mcg  18 mcg Inhalation Daily Nicholes Mango, MD   18 mcg at 02/14/18 [phone]  Facility-Administered Medications Ordered in Other Encounters  Medication Dose Route Frequency Provider Last Rate Last Dose  . HYDROmorphone (DILAUDID) injection 1 mg  1 mg Intravenous Once Ticia Virgo, Kirt Boys, NP        VITAL SIGNS: BP 137/66 (BP Location: Right Arm)   Pulse 88   Temp 97.8 F (36.6 C) (Oral)   Resp 16   Ht 5\' 8"  (1.727 m)   SpO2 94%   BMI 19.22 kg/m  There were no vitals filed for this visit.  Estimated body mass index is 19.22 kg/m as calculated from the following:   Height as of this encounter: 5\' 8"  (1.727 m).   Weight as of 02/08/18: 126 lb 6.4 oz (57.3 kg).  LABS: CBC:    Component Value Date/Time   WBC 6.3 02/12/2018 0538   HGB 9.7 (L) 02/12/2018 0538   HCT 29.0 (L) 02/12/2018 0538   PLT 129 (L) 02/12/2018 0538   MCV 86.6 02/12/2018 0538   NEUTROABS 12.9 (H) 02/08/2018 1130   LYMPHSABS 0.8 02/08/2018 1130   MONOABS 0.2 02/08/2018 1130   EOSABS 0.0 02/08/2018 1130   BASOSABS 0.0 02/08/2018 1130   Comprehensive Metabolic Panel:    Component Value Date/Time   NA 139 02/12/2018 0538   K 3.3 (L) 02/12/2018 0538   CL 106 02/12/2018 0538   CO2 27 02/12/2018 0538   BUN 18 02/12/2018 0538   CREATININE 0.62 02/12/2018 0538   GLUCOSE 86 02/12/2018 0538   CALCIUM 7.9 (L) 02/12/2018 0538   AST 21 02/12/2018 0538   ALT 11 02/12/2018 0538   ALKPHOS 76 02/12/2018  0538   BILITOT 0.8 02/12/2018 0538   PROT 5.7 (L) 02/12/2018 0538   ALBUMIN 3.0 (L) 02/12/2018 0538    RADIOGRAPHIC STUDIES: Dg Chest 2 View  Result Date: 01/20/2018 CLINICAL DATA:  Shortness of breath and left upper chest pain in a patient with lung carcinoma. EXAM: CHEST - 2 VIEW COMPARISON:  PET CT scan 01/11/2018. PA and lateral chest 09/01/2016. FINDINGS: The lungs are emphysematous. No consolidative process, pneumothorax or effusion. Mass lesion in the left mediastinum is identified as seen on the prior exams. Heart size is normal. No acute bony abnormality. Destructive lesion in the left transverse process of T2 is not visible on this exam. No acute bony abnormality is seen. IMPRESSION: No acute disease. Mediastinal mass lesion on the left as seen on prior exams. Emphysema. Electronically Signed   By: Inge Rise M.D.   On: 01/20/2018 14:40   Ct Head Wo Contrast  Result Date: 02/08/2018 CLINICAL DATA:  Acute right neck pain and headache beginning after radiation therapy yesterday. History of poorly differentiated neuroendocrine cancer. EXAM: CT HEAD WITHOUT CONTRAST CT CERVICAL SPINE WITHOUT CONTRAST TECHNIQUE: Multidetector CT imaging of the head and cervical spine was performed following the standard protocol without intravenous contrast. Multiplanar CT image reconstructions of the cervical spine were also generated. COMPARISON:  Brain MRI 01/09/2018.  PET-CT 01/11/2018. FINDINGS: CT HEAD FINDINGS Brain: There is no evidence of acute infarct, intracranial hemorrhage, mass, midline shift, or extra-axial fluid collection. The ventricles and sulci are within normal limits for age. Scattered cerebral white matter hypodensities are nonspecific but compatible with mild chronic small vessel ischemic disease. Vascular: Calcified atherosclerosis at the skull base. No hyperdense vessel. Skull: 3.5 cm destructive skull base lesion involving the right occipital condyle, petrous ridge, and clivus,  increased in size from the prior MRI. Scattered subcentimeter lesions elsewhere in the skull. Sinuses/Orbits: Visualized paranasal sinuses and mastoid air  cells are clear. Bilateral cataract extraction is noted. Other: None. CT CERVICAL SPINE FINDINGS Alignment: Slight right convex curvature of the cervical spine. No significant listhesis. Skull base and vertebrae: Right-sided skull base lesion as above. 4 cm destructive mass involving the left-sided posterior elements at T2. 1 cm lytic lesion in the C7 vertebral body. No acute fracture Soft tissues and spinal canal: Left-sided epidural tumor at T2 without evidence of high-grade spinal stenosis. No prevertebral swelling. Disc levels: Moderate disc space narrowing and degenerative endplate spurring from Z3-0 to C7-T1 with moderate to severe multilevel neural foraminal stenosis. Upper chest: Centrilobular emphysema in the lung apices. Other: Carotid artery atherosclerosis. IMPRESSION: 1. No evidence of acute intracranial abnormality. 2. Osseous metastatic disease with significant interval enlargement of a destructive right skull base mass. 3. No acute osseous abnormality identified in the cervical spine. 4. 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 5. Metastasis involving the T2 posterior elements with left lateral epidural tumor. Electronically Signed   By: Logan Bores M.D.   On: 02/08/2018 13:09   Ct Angio Chest Pe W And/or Wo Contrast  Result Date: 01/20/2018 CLINICAL DATA:  Shortness of breath and left chest pain today. History of lung cancer. EXAM: CT ANGIOGRAPHY CHEST WITH CONTRAST TECHNIQUE: Multidetector CT imaging of the chest was performed using the standard protocol during bolus administration of intravenous contrast. Multiplanar CT image reconstructions and MIPs were obtained to evaluate the vascular anatomy. CONTRAST:  75 mL OMNIPAQUE IOHEXOL 350 MG/ML SOLN COMPARISON:  PET CT scan 01/11/2018. FINDINGS:  Cardiovascular: Satisfactory opacification of the pulmonary arteries to the segmental level. No evidence of pulmonary embolism. Normal heart size. No pericardial effusion. Mediastinum/Nodes: Again seen is a mass lesion centered in the mediastinum on the left which measures 5.4 x 3.9 cm on image 43, unchanged. As noted on the prior examination, the lesion is centered in the AP window. It abuts the right main pulmonary artery without invading it. The mass also surrounds the interlobar branch to the anterior left upper lobe. No right hilar lymphadenopathy is identified. The esophagus and thyroid gland appear normal. Lungs/Pleura: The patient has a small left pleural effusion which is new since the prior exam. The lungs demonstrate extensive centrilobular emphysema. A left upper lobe nodule on image 42 measuring 0.7 cm is unchanged. There also a few scattered ground-glass attenuation opacities in the anterior left upper lobe which are unchanged. The right lung is clear. Upper Abdomen: No acute abnormality. Cyst in the right kidney noted. Musculoskeletal: Destructive lesion in the left T2 transverse process is identified and measures approximately 3.5 x 2.0 cm on image 11 of series 7, not notably changed. Also seen is a large destructive lesion in the left side of the T12 vertebral body extending into the left pedicle, unchanged. No new bony abnormality is identified. Review of the MIP images confirms the above findings. IMPRESSION: Negative for pulmonary embolus or acute cardiopulmonary disease. Small left pleural effusion is new since the prior exam. No change in a left mediastinal mass lesion with metastatic deposits in the left side of T12 and left transverse process of T2. Aortic Atherosclerosis (ICD10-I70.0) and Severe emphysema (ICD10-J43.9). Electronically Signed   By: Inge Rise M.D.   On: 01/20/2018 16:40   Ct Cervical Spine Wo Contrast  Result Date: 02/08/2018 CLINICAL DATA:  Acute right neck pain  and headache beginning after radiation therapy yesterday. History of poorly differentiated neuroendocrine cancer. EXAM: CT HEAD WITHOUT CONTRAST CT CERVICAL SPINE WITHOUT CONTRAST  TECHNIQUE: Multidetector CT imaging of the head and cervical spine was performed following the standard protocol without intravenous contrast. Multiplanar CT image reconstructions of the cervical spine were also generated. COMPARISON:  Brain MRI 01/09/2018.  PET-CT 01/11/2018. FINDINGS: CT HEAD FINDINGS Brain: There is no evidence of acute infarct, intracranial hemorrhage, mass, midline shift, or extra-axial fluid collection. The ventricles and sulci are within normal limits for age. Scattered cerebral white matter hypodensities are nonspecific but compatible with mild chronic small vessel ischemic disease. Vascular: Calcified atherosclerosis at the skull base. No hyperdense vessel. Skull: 3.5 cm destructive skull base lesion involving the right occipital condyle, petrous ridge, and clivus, increased in size from the prior MRI. Scattered subcentimeter lesions elsewhere in the skull. Sinuses/Orbits: Visualized paranasal sinuses and mastoid air cells are clear. Bilateral cataract extraction is noted. Other: None. CT CERVICAL SPINE FINDINGS Alignment: Slight right convex curvature of the cervical spine. No significant listhesis. Skull base and vertebrae: Right-sided skull base lesion as above. 4 cm destructive mass involving the left-sided posterior elements at T2. 1 cm lytic lesion in the C7 vertebral body. No acute fracture Soft tissues and spinal canal: Left-sided epidural tumor at T2 without evidence of high-grade spinal stenosis. No prevertebral swelling. Disc levels: Moderate disc space narrowing and degenerative endplate spurring from V9-5 to C7-T1 with moderate to severe multilevel neural foraminal stenosis. Upper chest: Centrilobular emphysema in the lung apices. Other: Carotid artery atherosclerosis. IMPRESSION: 1. No evidence of  acute intracranial abnormality. 2. Osseous metastatic disease with significant interval enlargement of a destructive right skull base mass. 3. No acute osseous abnormality identified in the cervical spine. 4. 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 5. Metastasis involving the T2 posterior elements with left lateral epidural tumor. Electronically Signed   By: Logan Bores M.D.   On: 02/08/2018 13:09   Ct Biopsy  Result Date: 01/22/2018 INDICATION: Lytic bone lesions and lung mass EXAM: CT BIOPSY MEDICATIONS: None. ANESTHESIA/SEDATION: Fentanyl 100 mcg IV; Versed 1 mg IV Moderate Sedation Time:  6 minutes The patient was continuously monitored during the procedure by the interventional radiology nurse under my direct supervision. FLUOROSCOPY TIME:  Fluoroscopy Time:  minutes  seconds ( mGy). COMPLICATIONS: None immediate. PROCEDURE: Informed written consent was obtained from the patient after a thorough discussion of the procedural risks, benefits and alternatives. All questions were addressed. Maximal Sterile Barrier Technique was utilized including caps, mask, sterile gowns, sterile gloves, sterile drape, hand hygiene and skin antiseptic. A timeout was performed prior to the initiation of the procedure. Under CT guidance, a(n) 17 gauge guide needle was advanced into the right iliac bone lesion. Subsequently, 3 18 gauge core biopsies were obtained. The guide needle was removed. Post biopsy images demonstrate no hemorrhage. Patient tolerated the procedure well without complication. Vital sign monitoring by nursing staff during the procedure will continue as patient is in the special procedures unit for post procedure observation. FINDINGS: The images document guide needle placement within the right iliac bone lesion. Post biopsy images demonstrate no hemorrhage. IMPRESSION: Successful CT-guided core biopsy of a right iliac bone lesion. Electronically Signed   By:  Marybelle Killings M.D.   On: 01/22/2018 12:48    PERFORMANCE STATUS (ECOG) : 3 - Symptomatic, >50% confined to bed  Review of Systems As noted above. Otherwise, a complete review of systems is negative.  Physical Exam General: NAD, frail appearing, thin, temporal wasting bilaterally Cardiovascular: regular rate and rhythm Pulmonary: clear ant fields Abdomen: soft, nontender, +  bowel sounds GU: no suprapubic tenderness Extremities: no edema, no joint deformities Skin: no rashes Neurological: Weakness, cranial nerves II through XII grossly intact  IMPRESSION: Follow up visit made. Patient generally appears more comfortable and is currently visiting with his family. Pain currently rated as 6/10. However, he says that pain was much worse during night for past two nights.   In past 24 hours, he has received 40mg  oxycodone IR, 45mg  OxyContin, and 2mg  IV hydrmorphone. He continues to receive gabapentin, dexamethasone, Mobic, and amitriptyline. Total oral MME of opioids is 167mg  in past 24 hours. No adverse effects from pain medications reported.   He has had a BM in past 24 hours. Oral intake is reportedly adequate.   Will increase frequency of gabapentin to 100mg  Q8H and dose of OxyContin to 20mg  Q8H.   Patient had first treatment of XRT today and will receive second treatment tomorrow AM. Hopefully, XRT will help improve symptoms.   PLAN: Increase OxyContin to 20mg  Q8H Increase frequency of gabapentin to 100mg  Q8H Continue oxycodone 5-10mg  Q3H prn for BTP Continue to titrate oral regimen until pain is stable Continue dexamethasone, Mobic, and amitriptyline   Time Total: 30 minutes  Visit consisted of counseling and education dealing with the complex and emotionally intense issues of symptom management and palliative care in the setting of serious and potentially life-threatening illness.Greater than 50%  of this time was spent counseling and coordinating care related to the above  assessment and plan.  Signed by: Altha Harm, PhD, NP-C [phone] (Work Cell)

## 2018-02-14 NOTE — Progress Notes (Addendum)
[facility] NAME: Joseph Parker    MR#:  [id number]  DATE OF BIRTH:  [birth year]  SUBJECTIVE:   Patient had first round of radiation today.  States that it went well.  Rates his pain as a 6/10 today, improved from yesterday.  Pain is mostly located in his head and neck.  REVIEW OF SYSTEMS:  CONSTITUTIONAL: No fever, fatigue or weakness.  EYES: No blurred or double vision.  EARS, NOSE, AND THROAT: No tinnitus or ear pain.  RESPIRATORY: No cough, shortness of breath, wheezing or hemoptysis.  CARDIOVASCULAR: No chest pain, orthopnea, edema.  GASTROINTESTINAL: No nausea, vomiting, diarrhea or abdominal pain.  GENITOURINARY: No dysuria, hematuria.  ENDOCRINE: No polyuria, nocturia,  HEMATOLOGY: No anemia, easy bruising or bleeding SKIN: No rash or lesion. MUSCULOSKELETAL: No joint pain or arthritis.   NEUROLOGIC: No tingling, numbness, weakness. +headache. PSYCHIATRY: No anxiety or depression.   DRUG ALLERGIES:   Allergies  Allergen Reactions  . Penicillins Itching, Swelling and Rash    DID THE REACTION INVOLVE: Swelling of the face/tongue/throat, SOB, or low BP? Yes Sudden or severe rash/hives, skin peeling, or the inside of the mouth or nose? Unknown Did it require medical treatment? Yes When did it last happen?30+ years If all above answers are "NO", may proceed with cephalosporin use.   Lavella Lemons [Benzonatate] Swelling    Had wide-spread numbness, throat swelling    VITALS:  Blood pressure (!) 142/79, pulse 92, temperature 97.9 F (36.6 C), temperature source Oral, resp. rate 20, height 5\' 8"  (1.727 m), SpO2 93 %.  PHYSICAL EXAMINATION:  GENERAL:  77 y.o.-year-old patient sitting up in bed in no acute distress, eating breakfast. + Cachectic-appearing. EYES: Pupils equal, round, reactive to light and accommodation. No scleral icterus. Extraocular muscles intact.  HEENT: Head atraumatic, normocephalic. Oropharynx  and nasopharynx clear.  Moist mucous membranes. NECK:  Supple, no jugular venous distention. No thyroid enlargement, no tenderness.  LUNGS: Normal breath sounds bilaterally, no wheezing, rales,rhonchi or crepitation. No use of accessory muscles of respiration.  CARDIOVASCULAR: RRR, S1, S2 normal. No murmurs, rubs, or gallops.  ABDOMEN: Soft, nontender, nondistended. Bowel sounds present. No organomegaly or mass.  EXTREMITIES: No pedal edema, cyanosis, or clubbing.  NEUROLOGIC: Cranial nerves II through XII are intact. +global weakness. Sensation intact. Gait not checked.  PSYCHIATRIC: The patient is alert and oriented x 3.  SKIN: No obvious rash, lesion, or ulcer.   LABORATORY PANEL:   CBC Recent Labs  Lab 02/14/18 1259  WBC 0.6*  HGB 9.5*  HCT 27.5*  PLT 80*   ------------------------------------------------------------------------------------------------------------------  Chemistries  Recent Labs  Lab 02/10/18 0846  02/12/18 0538 02/14/18 1259  NA 140  --  139 134*  K 4.0  --  3.3* 3.8  CL 110  --  106 100  CO2 24  --  27 26  GLUCOSE 99  --  86 131*  BUN 21  --  18 28*  CREATININE 0.64   < > 0.62 1.78*  CALCIUM 7.8*  --  7.9* 8.3*  MG 2.4  --   --   --   AST  --   --  21  --   ALT  --   --  11  --   ALKPHOS  --   --  76  --   BILITOT  --   --  0.8  --    < > = values in this interval not displayed.   ------------------------------------------------------------------------------------------------------------------  Cardiac Enzymes No results for input(s): TROPONINI in the last 168 hours. ------------------------------------------------------------------------------------------------------------------  RADIOLOGY:  No results found.  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  Still required a couple of doses of IV Dilaudid in the last 24 hours.  -Completed  first radiation treatment today -Palliative care following- increased dose of OxyContin and gabapentin today -Continue dexamethasone and amitriptyline -Hold meloxicam due to AKI  Pancytopenia- patient has had significant drops in all cell lines.  Likely due to chemo. -Discussed with oncology, who anticipate that this will likely improve in 7-10 days -Per onc, continue Lovenox unless platelets <50,000 -Check anemia panel  AKI- Cr increased from 0.62 to 1.78 today. Possibly due to dehydration and NSAIDS. 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 Management plans discussed with the patient, family and they are in agreement.  CODE STATUS: full.  TOTAL TIME TAKING CARE OF THIS PATIENT: 35 minutes.    POSSIBLE D/C IN 1-2 DAYS, DEPENDING ON CLINICAL CONDITION.   Berna Spare Mayo M.D on [id number]   Between 7am to 6pm - Pager - [phone]  After 6pm go to [email or web address] - password [email or web address] [facility]  Office  [phone]  CC: Primary care physician; Tonia Ghent, MD  Note: This dictation was prepared with Dragon dictation along with smaller phrase technology. Any transcriptional errors that result from this process are unintentional.

## 2018-02-14 NOTE — Care Management Important Message (Signed)
Important Message  Patient Details  Name: Joseph Parker MRN: [id number] Date of Birth: 1940-06-06   Medicare Important Message Given:  Yes    Juliann Pulse A Collen Vincent 12020/06/217, 2:30 PM

## 2018-02-14 NOTE — Progress Notes (Signed)
PHARMACIST - PHYSICIAN COMMUNICATION  CONCERNING:  Enoxaparin (Lovenox) for DVT Prophylaxis    RECOMMENDATION: Patient was prescribed enoxaprin 40mg  q24 hours for VTE prophylaxis.   There were no vitals filed for this visit.  Body mass index is 19.22 kg/m.  Estimated Creatinine Clearance: 27.7 mL/min (A) (by C-G formula based on SCr of 1.78 mg/dL (H)).   Patient is candidate for enoxaparin 30mg  every 24 hours based on CrCl <3ml/min or Weight less then 45kg for male and 50kg for male  DESCRIPTION: Pharmacy has adjusted enoxaparin dose per [facility] policy.  Patient is now receiving enoxaparin 30mg  every 24 hours.  Forrest Moron, PharmD Clinical Pharmacist  102/24/202019 3:13 PM

## 2018-02-15 ENCOUNTER — Inpatient Hospital Stay: Payer: Medicare Other

## 2018-02-15 ENCOUNTER — Ambulatory Visit
Admission: RE | Admit: 2018-02-15 | Discharge: 2018-02-15 | Disposition: A | Discharge status: Home / Self Care | Payer: Medicare Other | Source: Ambulatory Visit | Attending: Radiation Oncology | Admitting: Radiation Oncology

## 2018-02-15 DIAGNOSIS — N179 Acute kidney failure, unspecified: Secondary | ICD-10-CM

## 2018-02-15 DIAGNOSIS — N401 Enlarged prostate with lower urinary tract symptoms: Secondary | ICD-10-CM

## 2018-02-15 DIAGNOSIS — C3490 Malignant neoplasm of unspecified part of unspecified bronchus or lung: Secondary | ICD-10-CM | POA: Diagnosis not present

## 2018-02-15 DIAGNOSIS — D696 Thrombocytopenia, unspecified: Secondary | ICD-10-CM

## 2018-02-15 DIAGNOSIS — Z87891 Personal history of nicotine dependence: Secondary | ICD-10-CM | POA: Diagnosis not present

## 2018-02-15 DIAGNOSIS — N138 Other obstructive and reflux uropathy: Secondary | ICD-10-CM

## 2018-02-15 DIAGNOSIS — C7A00 Malignant carcinoid tumor of unspecified site: Secondary | ICD-10-CM

## 2018-02-15 DIAGNOSIS — C7951 Secondary malignant neoplasm of bone: Secondary | ICD-10-CM | POA: Diagnosis not present

## 2018-02-15 DIAGNOSIS — D72819 Decreased white blood cell count, unspecified: Secondary | ICD-10-CM

## 2018-02-15 LAB — BASIC METABOLIC PANEL
Anion gap: 10 (ref 5–15)
BUN: 30 mg/dL — ABNORMAL HIGH (ref 8–23)
CO2: 23 mmol/L (ref 22–32)
Calcium: 8 mg/dL — ABNORMAL LOW (ref 8.9–10.3)
Chloride: 103 mmol/L (ref 98–111)
Creatinine, Ser: 1.92 mg/dL — ABNORMAL HIGH (ref 0.61–1.24)
GFR calc non Af Amer: 33 mL/min — ABNORMAL LOW (ref >60)
GFR, EST AFRICAN AMERICAN: 38 mL/min — AB (ref >60)
Glucose, Bld: 101 mg/dL — ABNORMAL HIGH (ref 70–99)
Potassium: 3.8 mmol/L (ref 3.5–5.1)
Sodium: 136 mmol/L (ref 135–145)

## 2018-02-15 LAB — CBC
HEMATOCRIT: 26.8 % — AB (ref 39.0–52.0)
Hemoglobin: 8.9 g/dL — ABNORMAL LOW (ref 13.0–17.0)
MCH: 28.1 pg (ref 26.0–34.0)
MCHC: 33.2 g/dL (ref 30.0–36.0)
MCV: 84.5 fL (ref 80.0–100.0)
Platelets: 61 10*3/uL — ABNORMAL LOW (ref 150–400)
RBC: 3.17 MIL/uL — ABNORMAL LOW (ref 4.22–5.81)
RDW: 13.2 % (ref 11.5–15.5)
WBC: 0.8 10*3/uL — AB (ref 4.0–10.5)
nRBC: 0 % (ref 0.0–0.2)

## 2018-02-15 MED ORDER — ENSURE ENLIVE PO LIQD
237.0000 mL | Freq: Three times a day (TID) | ORAL | Status: DC
  Administered 2018-02-15 – 2018-02-16 (×2): 237 mL via ORAL

## 2018-02-15 MED ORDER — TAMSULOSIN HCL 0.4 MG PO CAPS
0.4000 mg | ORAL_CAPSULE | Freq: Every day | ORAL | Status: DC
  Administered 2018-02-15 – 2018-02-16 (×2): 0.4 mg via ORAL
  Filled 2018-02-15 (×2): qty 1

## 2018-02-15 MED ORDER — OXYCODONE HCL 5 MG PO TABS
5.0000 mg | ORAL_TABLET | ORAL | Status: DC | PRN
  Administered 2018-02-15 – 2018-02-16 (×2): 10 mg via ORAL
  Filled 2018-02-15 (×2): qty 2

## 2018-02-15 MED ORDER — NYSTATIN 100000 UNIT/ML MT SUSP
5.0000 mL | Freq: Four times a day (QID) | OROMUCOSAL | Status: DC
  Administered 2018-02-15 – 2018-02-16 (×3): 500000 [IU] via OROMUCOSAL
  Filled 2018-02-15 (×3): qty 5

## 2018-02-15 MED ORDER — POLYETHYLENE GLYCOL 3350 17 G PO PACK
17.0000 g | PACK | Freq: Every day | ORAL | Status: DC
  Administered 2018-02-15 – 2018-02-16 (×2): 17 g via ORAL
  Filled 2018-02-15 (×2): qty 1

## 2018-02-15 MED ORDER — SODIUM CHLORIDE 0.9 % IV SOLN
INTRAVENOUS | Status: AC
  Administered 2018-02-15: 12:00:00 via INTRAVENOUS

## 2018-02-15 NOTE — Progress Notes (Signed)
[place]  [phone] [phone]   Name: Joseph Parker Date: 02/15/2018 MRN: [id number]  DOB: [birth year]  Patient Care Team: Tonia Ghent, MD as PCP - General (Family Medicine) Eustace Moore, MD as Consulting Physician (Neurosurgery) Telford Nab, RN as Registered Nurse    REASON FOR CONSULTATION: [facility] consult requested for this 77 y.o. 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  He also has a history of pathologic fracture to right trochanter.  PMH also notable for COPD, CAD, carotid artery disease, and recent oral candidiasis.  Patient has been receiving treatment with carboplatin and etoposide and Tecentriq. He has also received XRT to SI joint/lumbar region. He was seen on 02/08/2018 by Dr. Tasia Catchings with intractable pain, which was unrelieved with multiple doses of hydromorphone and dexamethasone.  CT of the cervical spine and head revealed metastatic disease to T2 and enlargement of a destructive right skull mass. Patient was admitted for intractable pain and remained hospitalized 02/08/2018 to 02/10/2018 for same.  Pain ultimately improved with combination of OxyContin, PRN Norco, dexamethasone, gabapentin, and IV Toradol.  Patient was discharged home and was readmitted on 02/11/18 again with intractable pain.    CODE STATUS: Full Code  PAST MEDICAL HISTORY: Past Medical History:  Diagnosis Date  . Cancer ([place])   . Carotid arterial disease ([place])   . Carotid artery disease ([place])   . Chickenpox   . Colon polyps   . COPD (chronic obstructive pulmonary disease) ([place])   . Hyperlipidemia   . Neuroendocrine cancer ([place]) 01/28/2018  . Normal cardiac stress test    06/2013  . Shortness of breath dyspnea   . Urinary frequency   . Urinary hesitancy     PAST SURGICAL HISTORY:  Past Surgical  History:  Procedure Laterality Date  . CATARACT EXTRACTION, BILATERAL    . COLONOSCOPY    . ESOPHAGOGASTRODUODENOSCOPY    . HERNIA REPAIR     right inquinal - as child  . LUMBAR LAMINECTOMY/DECOMPRESSION MICRODISCECTOMY Left 10/14/2014   Procedure: Left Lumbar three-four extraforaminla microdiskectomy;  Surgeon: Eustace Moore, MD;  Location: [facility];  Service: Neurosurgery;  Laterality: Left;  Left L34 extraforaminal microdiskectomy  . POLYPECTOMY    . PORTA CATH INSERTION N/A 01/31/2018   Procedure: PORTA CATH INSERTION;  Surgeon: Algernon Huxley, MD;  Location: [facility];  Service: Cardiovascular;  Laterality: N/A;  . TONSILLECTOMY     remote - childhood  . UPPER GASTROINTESTINAL ENDOSCOPY      HEMATOLOGY/ONCOLOGY HISTORY:    Neuroendocrine cancer ([place])   01/28/2018 Initial Diagnosis    Neuroendocrine cancer ([place])    02/05/2018 -  Chemotherapy    The patient had palonosetron (ALOXI) injection 0.25 mg, 0.25 mg, Intravenous,  Once, 1 of 4 cycles Administration: 0.25 mg (02/05/2018) pegfilgrastim-cbqv (UDENYCA) injection 6 mg, 6 mg, Subcutaneous, Once, 1 of 4 cycles Administration: 6 mg (02/08/2018) CARBOplatin (PARAPLATIN) 360 mg in sodium chloride 0.9 % 250 mL chemo infusion, 370 mg (100 % of original dose 374 mg), Intravenous,  Once, 1 of 4 cycles Dose modification:   (original dose 374 mg, Cycle 1) Administration: 360 mg (02/05/2018) etoposide (VEPESID) 170 mg in sodium chloride 0.9 % 500 mL chemo infusion, 100 mg/m2 = 170 mg, Intravenous,  Once, 1 of 4 cycles Administration: 170 mg (02/05/2018), 170 mg (02/06/2018), 170 mg (02/07/2018) fosaprepitant (EMEND) 150  mg, dexamethasone (DECADRON) 12 mg in sodium chloride 0.9 % 145 mL IVPB, , Intravenous,  Once, 1 of 4 cycles Administration:  (02/05/2018) atezolizumab (TECENTRIQ) 1,200 mg in sodium chloride 0.9 % 250 mL chemo infusion, 1,200 mg, Intravenous, Once, 1 of 8 cycles Administration: 1,200 mg (02/05/2018)  for  chemotherapy treatment.      ALLERGIES:  is allergic to penicillins and tessalon [benzonatate].  MEDICATIONS:  Current Facility-Administered Medications  Medication Dose Route Frequency Provider Last Rate Last Dose  . 0.9 %  sodium chloride infusion   Intravenous Continuous Mayo, Pete Pelt, MD      . acetaminophen (TYLENOL) tablet 650 mg  650 mg Oral Q6H PRN Gouru, Aruna, MD       Or  . acetaminophen (TYLENOL) suppository 650 mg  650 mg Rectal Q6H PRN Gouru, Aruna, MD      . albuterol (PROVENTIL) (2.5 MG/3ML) 0.083% nebulizer solution 2.5 mL  2.5 mL Inhalation Q6H PRN Gouru, Aruna, MD      . amitriptyline (ELAVIL) tablet 50 mg  50 mg Oral QHS Gouru, Illene Silver, MD   50 mg at 02/14/18 2007  . aspirin EC tablet 81 mg  81 mg Oral Daily Gouru, Aruna, MD   81 mg at 02/14/18 0758  . dexamethasone (DECADRON) tablet 4 mg  4 mg Oral Daily Gouru, Aruna, MD   4 mg at 02/14/18 0758  . dronabinol (MARINOL) capsule 5 mg  5 mg Oral BID AC Gouru, Aruna, MD   5 mg at 02/14/18 1551  . enoxaparin (LOVENOX) injection 30 mg  30 mg Subcutaneous Q24H Colin Broach A, RPH   30 mg at 02/14/18 2007  . gabapentin (NEURONTIN) capsule 100 mg  100 mg Oral TID Marzetta Lanza, Kirt Boys, NP   100 mg at 02/14/18 2007  . HYDROmorphone (DILAUDID) injection 1 mg  1 mg Intravenous Q6H PRN Vaughan Basta, MD   1 mg at 02/14/18 0748  . LORazepam (ATIVAN) injection 0.5 mg  0.5 mg Intravenous Q4H PRN Gouru, Aruna, MD      . MEDLINE mouth rinse  15 mL Mouth Rinse BID Gouru, Aruna, MD   15 mL at 02/14/18 2009  . mometasone-formoterol (DULERA) 200-5 MCG/ACT inhaler 2 puff  2 puff Inhalation BID Nicholes Mango, MD   2 puff at 02/14/18 2008  . ondansetron (ZOFRAN) tablet 4 mg  4 mg Oral Q6H PRN Gouru, Aruna, MD       Or  . ondansetron (ZOFRAN) injection 4 mg  4 mg Intravenous Q6H PRN Gouru, Aruna, MD      . oxyCODONE (Oxy IR/ROXICODONE) immediate release tablet 5-10 mg  5-10 mg Oral Q3H PRN Eron Goble, Kirt Boys, NP   10 mg at 02/15/18  2585  . oxyCODONE (OXYCONTIN) 12 hr tablet 20 mg  20 mg Oral Q8H Marlayna Bannister, Kirt Boys, NP   20 mg at 02/15/18 0512  . pantoprazole (PROTONIX) EC tablet 20 mg  20 mg Oral Daily Kash Davie, Kirt Boys, NP   20 mg at 02/14/18 0759  . polyethylene glycol (MIRALAX / GLYCOLAX) packet 17 g  17 g Oral BID PRN Gouru, Aruna, MD      . prochlorperazine (COMPAZINE) tablet 10 mg  10 mg Oral Q6H PRN Gouru, Aruna, MD      . senna (SENOKOT) tablet 8.6 mg  1 tablet Oral BID Vaughan Basta, MD   8.6 mg at 02/14/18 2007  . tiotropium (SPIRIVA) inhalation capsule ([facility] use ONLY) 18 mcg  18 mcg Inhalation Daily Nicholes Mango, MD  18 mcg at 02/14/18 0759   Facility-Administered Medications Ordered in Other Encounters  Medication Dose Route Frequency Provider Last Rate Last Dose  . HYDROmorphone (DILAUDID) injection 1 mg  1 mg Intravenous Once Kc Sedlak, Kirt Boys, NP        VITAL SIGNS: BP (!) 159/74 (BP Location: Right Arm)   Pulse (!) 122   Temp 98.2 F (36.8 C) (Oral)   Resp 20   Ht 5\' 8"  (1.727 m)   SpO2 96%   BMI 19.22 kg/m  There were no vitals filed for this visit.  Estimated body mass index is 19.22 kg/m as calculated from the following:   Height as of this encounter: 5\' 8"  (1.727 m).   Weight as of 02/08/18: 126 lb 6.4 oz (57.3 kg).  LABS: CBC:    Component Value Date/Time   WBC 0.8 (LL) 02/15/2018 0424   HGB 8.9 (L) 02/15/2018 0424   HCT 26.8 (L) 02/15/2018 0424   PLT 61 (L) 02/15/2018 0424   MCV 84.5 02/15/2018 0424   NEUTROABS 12.9 (H) 02/08/2018 1130   LYMPHSABS 0.8 02/08/2018 1130   MONOABS 0.2 02/08/2018 1130   EOSABS 0.0 02/08/2018 1130   BASOSABS 0.0 02/08/2018 1130   Comprehensive Metabolic Panel:    Component Value Date/Time   NA 136 02/15/2018 0424   K 3.8 02/15/2018 0424   CL 103 02/15/2018 0424   CO2 23 02/15/2018 0424   BUN 30 (H) 02/15/2018 0424   CREATININE 1.92 (H) 02/15/2018 0424   GLUCOSE 101 (H) 02/15/2018 0424   CALCIUM 8.0 (L) 02/15/2018 0424   AST 21  02/12/2018 0538   ALT 11 02/12/2018 0538   ALKPHOS 76 02/12/2018 0538   BILITOT 0.8 02/12/2018 0538   PROT 5.7 (L) 02/12/2018 0538   ALBUMIN 3.0 (L) 02/12/2018 0538    RADIOGRAPHIC STUDIES: Dg Chest 2 View  Result Date: 01/20/2018 CLINICAL DATA:  Shortness of breath and left upper chest pain in a patient with lung carcinoma. EXAM: CHEST - 2 VIEW COMPARISON:  PET CT scan 01/11/2018. PA and lateral chest 09/01/2016. FINDINGS: The lungs are emphysematous. No consolidative process, pneumothorax or effusion. Mass lesion in the left mediastinum is identified as seen on the prior exams. Heart size is normal. No acute bony abnormality. Destructive lesion in the left transverse process of T2 is not visible on this exam. No acute bony abnormality is seen. IMPRESSION: No acute disease. Mediastinal mass lesion on the left as seen on prior exams. Emphysema. Electronically Signed   By: Inge Rise M.D.   On: 01/20/2018 14:40   Ct Head Wo Contrast  Result Date: 02/08/2018 CLINICAL DATA:  Acute right neck pain and headache beginning after radiation therapy yesterday. History of poorly differentiated neuroendocrine cancer. EXAM: CT HEAD WITHOUT CONTRAST CT CERVICAL SPINE WITHOUT CONTRAST TECHNIQUE: Multidetector CT imaging of the head and cervical spine was performed following the standard protocol without intravenous contrast. Multiplanar CT image reconstructions of the cervical spine were also generated. COMPARISON:  Brain MRI 01/09/2018.  PET-CT 01/11/2018. FINDINGS: CT HEAD FINDINGS Brain: There is no evidence of acute infarct, intracranial hemorrhage, mass, midline shift, or extra-axial fluid collection. The ventricles and sulci are within normal limits for age. Scattered cerebral white matter hypodensities are nonspecific but compatible with mild chronic small vessel ischemic disease. Vascular: Calcified atherosclerosis at the skull base. No hyperdense vessel. Skull: 3.5 cm destructive skull base lesion  involving the right occipital condyle, petrous ridge, and clivus, increased in size from the prior MRI. Scattered subcentimeter  lesions elsewhere in the skull. Sinuses/Orbits: Visualized paranasal sinuses and mastoid air cells are clear. Bilateral cataract extraction is noted. Other: None. CT CERVICAL SPINE FINDINGS Alignment: Slight right convex curvature of the cervical spine. No significant listhesis. Skull base and vertebrae: Right-sided skull base lesion as above. 4 cm destructive mass involving the left-sided posterior elements at T2. 1 cm lytic lesion in the C7 vertebral body. No acute fracture Soft tissues and spinal canal: Left-sided epidural tumor at T2 without evidence of high-grade spinal stenosis. No prevertebral swelling. Disc levels: Moderate disc space narrowing and degenerative endplate spurring from Z6-1 to C7-T1 with moderate to severe multilevel neural foraminal stenosis. Upper chest: Centrilobular emphysema in the lung apices. Other: Carotid artery atherosclerosis. IMPRESSION: 1. No evidence of acute intracranial abnormality. 2. Osseous metastatic disease with significant interval enlargement of a destructive right skull base mass. 3. No acute osseous abnormality identified in the cervical spine. 4. 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 5. Metastasis involving the T2 posterior elements with left lateral epidural tumor. Electronically Signed   By: Logan Bores M.D.   On: 02/08/2018 13:09   Ct Angio Chest Pe W And/or Wo Contrast  Result Date: 01/20/2018 CLINICAL DATA:  Shortness of breath and left chest pain today. History of lung cancer. EXAM: CT ANGIOGRAPHY CHEST WITH CONTRAST TECHNIQUE: Multidetector CT imaging of the chest was performed using the standard protocol during bolus administration of intravenous contrast. Multiplanar CT image reconstructions and MIPs were obtained to evaluate the vascular anatomy. CONTRAST:  75 mL OMNIPAQUE  IOHEXOL 350 MG/ML SOLN COMPARISON:  PET CT scan 01/11/2018. FINDINGS: Cardiovascular: Satisfactory opacification of the pulmonary arteries to the segmental level. No evidence of pulmonary embolism. Normal heart size. No pericardial effusion. Mediastinum/Nodes: Again seen is a mass lesion centered in the mediastinum on the left which measures 5.4 x 3.9 cm on image 43, unchanged. As noted on the prior examination, the lesion is centered in the AP window. It abuts the right main pulmonary artery without invading it. The mass also surrounds the interlobar branch to the anterior left upper lobe. No right hilar lymphadenopathy is identified. The esophagus and thyroid gland appear normal. Lungs/Pleura: The patient has a small left pleural effusion which is new since the prior exam. The lungs demonstrate extensive centrilobular emphysema. A left upper lobe nodule on image 42 measuring 0.7 cm is unchanged. There also a few scattered ground-glass attenuation opacities in the anterior left upper lobe which are unchanged. The right lung is clear. Upper Abdomen: No acute abnormality. Cyst in the right kidney noted. Musculoskeletal: Destructive lesion in the left T2 transverse process is identified and measures approximately 3.5 x 2.0 cm on image 11 of series 7, not notably changed. Also seen is a large destructive lesion in the left side of the T12 vertebral body extending into the left pedicle, unchanged. No new bony abnormality is identified. Review of the MIP images confirms the above findings. IMPRESSION: Negative for pulmonary embolus or acute cardiopulmonary disease. Small left pleural effusion is new since the prior exam. No change in a left mediastinal mass lesion with metastatic deposits in the left side of T12 and left transverse process of T2. Aortic Atherosclerosis (ICD10-I70.0) and Severe emphysema (ICD10-J43.9). Electronically Signed   By: Inge Rise M.D.   On: 01/20/2018 16:40   Ct Cervical Spine Wo  Contrast  Result Date: 02/08/2018 CLINICAL DATA:  Acute right neck pain and headache beginning after radiation therapy yesterday. History of poorly differentiated  neuroendocrine cancer. EXAM: CT HEAD WITHOUT CONTRAST CT CERVICAL SPINE WITHOUT CONTRAST TECHNIQUE: Multidetector CT imaging of the head and cervical spine was performed following the standard protocol without intravenous contrast. Multiplanar CT image reconstructions of the cervical spine were also generated. COMPARISON:  Brain MRI 01/09/2018.  PET-CT 01/11/2018. FINDINGS: CT HEAD FINDINGS Brain: There is no evidence of acute infarct, intracranial hemorrhage, mass, midline shift, or extra-axial fluid collection. The ventricles and sulci are within normal limits for age. Scattered cerebral white matter hypodensities are nonspecific but compatible with mild chronic small vessel ischemic disease. Vascular: Calcified atherosclerosis at the skull base. No hyperdense vessel. Skull: 3.5 cm destructive skull base lesion involving the right occipital condyle, petrous ridge, and clivus, increased in size from the prior MRI. Scattered subcentimeter lesions elsewhere in the skull. Sinuses/Orbits: Visualized paranasal sinuses and mastoid air cells are clear. Bilateral cataract extraction is noted. Other: None. CT CERVICAL SPINE FINDINGS Alignment: Slight right convex curvature of the cervical spine. No significant listhesis. Skull base and vertebrae: Right-sided skull base lesion as above. 4 cm destructive mass involving the left-sided posterior elements at T2. 1 cm lytic lesion in the C7 vertebral body. No acute fracture Soft tissues and spinal canal: Left-sided epidural tumor at T2 without evidence of high-grade spinal stenosis. No prevertebral swelling. Disc levels: Moderate disc space narrowing and degenerative endplate spurring from W1-0 to C7-T1 with moderate to severe multilevel neural foraminal stenosis. Upper chest: Centrilobular emphysema in the lung  apices. Other: Carotid artery atherosclerosis. IMPRESSION: 1. No evidence of acute intracranial abnormality. 2. Osseous metastatic disease with significant interval enlargement of a destructive right skull base mass. 3. No acute osseous abnormality identified in the cervical spine. 4. 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 5. Metastasis involving the T2 posterior elements with left lateral epidural tumor. Electronically Signed   By: Logan Bores M.D.   On: 02/08/2018 13:09   Ct Biopsy  Result Date: 01/22/2018 INDICATION: Lytic bone lesions and lung mass EXAM: CT BIOPSY MEDICATIONS: None. ANESTHESIA/SEDATION: Fentanyl 100 mcg IV; Versed 1 mg IV Moderate Sedation Time:  6 minutes The patient was continuously monitored during the procedure by the interventional radiology nurse under my direct supervision. FLUOROSCOPY TIME:  Fluoroscopy Time:  minutes  seconds ( mGy). COMPLICATIONS: None immediate. PROCEDURE: Informed written consent was obtained from the patient after a thorough discussion of the procedural risks, benefits and alternatives. All questions were addressed. Maximal Sterile Barrier Technique was utilized including caps, mask, sterile gowns, sterile gloves, sterile drape, hand hygiene and skin antiseptic. A timeout was performed prior to the initiation of the procedure. Under CT guidance, a(n) 17 gauge guide needle was advanced into the right iliac bone lesion. Subsequently, 3 18 gauge core biopsies were obtained. The guide needle was removed. Post biopsy images demonstrate no hemorrhage. Patient tolerated the procedure well without complication. Vital sign monitoring by nursing staff during the procedure will continue as patient is in the special procedures unit for post procedure observation. FINDINGS: The images document guide needle placement within the right iliac bone lesion. Post biopsy images demonstrate no hemorrhage. IMPRESSION: Successful  CT-guided core biopsy of a right iliac bone lesion. Electronically Signed   By: Marybelle Killings M.D.   On: 01/22/2018 12:48    PERFORMANCE STATUS (ECOG) : 3 - Symptomatic, >50% confined to bed  Review of Systems As noted above. Otherwise, a complete review of systems is negative.  Physical Exam General: NAD, frail appearing, thin, temporal wasting bilaterally Cardiovascular:  regular rate and rhythm Pulmonary: clear ant fields Abdomen: soft, nontender, + bowel sounds GU: no suprapubic tenderness Extremities: no edema Skin: no rashes Neurological: Weakness but otherwise nonfocal  IMPRESSION: Follow up visit made. Pain is reportedly improved. Patient rates pain 5/10 today. He has not required prn hydromorphone overnight and says that the prn oxycodone is effective at controlling BTP. He is s/p first treatment of XRT yesterday and will receive another treatment later this AM.   AKI noted. SeCr worse today. IVF are being initiated and patient is pending renal US. Mobic was also appropriately discontinued.   Patient also noted to be pancytopenic likely secondary to chemotherapy (last on 12/17).   Case discussed with Dr. Tasia Catchings.   PLAN: Continue OxyContin 20mg  Q8H Continue oxycodone 5-10mg  Q3H prn for BTP Continue dexamethasone, gabapentin, and amitriptyline   Time Total: 20 minutes  Visit consisted of counseling and education dealing with the complex and emotionally intense issues of symptom management and palliative care in the setting of serious and potentially life-threatening illness.Greater than 50%  of this time was spent counseling and coordinating care related to the above assessment and plan.  Signed by: Altha Harm, PhD, NP-C [phone] (Work Cell)

## 2018-02-15 NOTE — Progress Notes (Signed)
[facility] [place] NAME: Joseph Parker    MR#:  [id number]  DATE OF BIRTH:  1940-11-03  SUBJECTIVE:   Patient states he is feeling little bit better today his pain has improved.  He has not required any IV pain medicines in the last 24 hours.  Went for his second radiation treatment this morning and tolerated it well.  REVIEW OF SYSTEMS:  CONSTITUTIONAL: No fever, fatigue or weakness.  EYES: No blurred or double vision.  EARS, NOSE, AND THROAT: No tinnitus or ear pain.  RESPIRATORY: No cough, shortness of breath, wheezing or hemoptysis.  CARDIOVASCULAR: No chest pain, orthopnea, edema.  GASTROINTESTINAL: No nausea, vomiting, diarrhea or abdominal pain.  GENITOURINARY: No dysuria, hematuria.  ENDOCRINE: No polyuria, nocturia,  HEMATOLOGY: No anemia, easy bruising or bleeding SKIN: No rash or lesion. MUSCULOSKELETAL: No joint pain or arthritis.   NEUROLOGIC: No tingling, numbness, weakness. +headache. PSYCHIATRY: No anxiety or depression.   DRUG ALLERGIES:   Allergies  Allergen Reactions  . Penicillins Itching, Swelling and Rash    DID THE REACTION INVOLVE: Swelling of the face/tongue/throat, SOB, or low BP? Yes Sudden or severe rash/hives, skin peeling, or the inside of the mouth or nose? Unknown Did it require medical treatment? Yes When did it last happen?30+ years If all above answers are "NO", may proceed with cephalosporin use.   Lavella Lemons [Benzonatate] Swelling    Had wide-spread numbness, throat swelling    VITALS:  Blood pressure (!) 116/58, pulse 92, temperature 97.7 F (36.5 C), temperature source Oral, resp. rate 20, height 5\' 8"  (1.727 m), SpO2 95 %.  PHYSICAL EXAMINATION:  GENERAL:  77 y.o.-year-old patient sitting up in bed in no acute distress, eating breakfast. + Cachectic-appearing. EYES: Pupils equal, round, reactive to light and accommodation. No scleral icterus. Extraocular muscles intact.  HEENT:  Head atraumatic, normocephalic. Oropharynx and nasopharynx clear.  Moist mucous membranes. NECK:  Supple, no jugular venous distention. No thyroid enlargement, no tenderness.  LUNGS: Normal breath sounds bilaterally, no wheezing, rales,rhonchi or crepitation. No use of accessory muscles of respiration.  CARDIOVASCULAR: RRR, S1, S2 normal. No murmurs, rubs, or gallops.  ABDOMEN: Soft, nontender, nondistended. Bowel sounds present. No organomegaly or mass.  EXTREMITIES: No pedal edema, cyanosis, or clubbing.  NEUROLOGIC: Cranial nerves II through XII are intact. +global weakness. Sensation intact. Gait not checked.  PSYCHIATRIC: The patient is alert and oriented x 3.  SKIN: No obvious rash, lesion, or ulcer.   LABORATORY PANEL:   CBC Recent Labs  Lab 02/15/18 0424  WBC 0.8*  HGB 8.9*  HCT 26.8*  PLT 61*   ------------------------------------------------------------------------------------------------------------------  Chemistries  Recent Labs  Lab 02/10/18 0846  02/12/18 0538  02/15/18 0424  NA 140  --  139   < > 136  K 4.0  --  3.3*   < > 3.8  CL 110  --  106   < > 103  CO2 24  --  27   < > 23  GLUCOSE 99  --  86   < > 101*  BUN 21  --  18   < > 30*  CREATININE 0.64   < > 0.62   < > 1.92*  CALCIUM 7.8*  --  7.9*   < > 8.0*  MG 2.4  --   --   --   --   AST  --   --  21  --   --   ALT  --   --  11  --   --   ALKPHOS  --   --  76  --   --   BILITOT  --   --  0.8  --   --    < > = values in this interval not displayed.   ------------------------------------------------------------------------------------------------------------------  Cardiac Enzymes No results for input(s): TROPONINI in the last 168 hours. ------------------------------------------------------------------------------------------------------------------  RADIOLOGY:  US Renal  Result Date: 02/15/2018 CLINICAL DATA:  Urinary incontinence EXAM: RENAL / URINARY TRACT ULTRASOUND COMPLETE COMPARISON:  CT  abdomen pelvis of 12/28/2017 FINDINGS: Right Kidney: Renal measurements: 12.6 x 5.3 x 5.6 cm. = volume: 194 mL. There is moderate right hydronephrosis present, new since the CT of 12/28/2016. Renal cysts are present primarily in the upper pole of 2.2 and 1.5 cm in diameter respectively. No solid renal lesion is seen. The echogenicity of the renal parenchyma may be slightly increased and correlation with renal laboratory values is recommended. Left Kidney: Renal measurements: 11.6 x 6.3 x 4.9 cm. = volume: One hundred ninety-one mL. Moderate hydronephrosis also noted involving the left kidney. Renal cysts are present of 2.8 and 2.5 cm in diameter respectively. The echogenicity of the left kidney may be slightly increased. Bladder: The urinary bladder is well distended. There does appear to be some prominence of the prostate gland. However the soft tissue noted in the posterior inferior aspect of the urinary bladder could be due to either indentation by prominent median lobe of the prostate versus a separate bladder carcinoma. I did review the recent CT and it is difficult to determine which may be the cause. This area within the posterior inferior aspect of the urinary bladder measures 4.5 x 2.2 x 2.4 cm. Prevoid urine volume of 924 cubic cm is measured. The patient could not void however for postvoid volume to be obtained. IMPRESSION: 1. Moderate bilateral hydronephrosis, new since the CT of 12/28/2017. 2. Soft tissue structure in the posterior inferior aspect of the urinary bladder which could represent either a primary urinary bladder carcinoma or possibly indentation by a prominent median lobe of the prostate. It is difficult to distinguish between the two entities on this ultrasound or prior CT of the abdomen pelvis. 3. Questionable slightly echogenic renal parenchyma bilaterally. 4. Bilateral renal cysts.  No solid renal lesion is seen. Electronically Signed   By: Ivar Drape M.D.   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  Has not required any additional doses of IV pain meds. -Receiving radiation at the cancer center -Palliative care following- increased dose of OxyContin and gabapentin today -Continue dexamethasone and amitriptyline -Hold meloxicam due to AKI  Pancytopenia- patient has had significant drops in all cell lines.  Likely due to chemo.  Anemia panel unremarkable. -Discussed with oncology, who anticipate that this will likely improve in 7-10 days -Per onc, continue Lovenox unless platelets <50,000  Acute urinary retention-likely due to pain meds.  Renal ultrasound with moderate bilateral hydronephrosis and possible soft tissue structure in the posterior urinary bladder.  Bladder scan with >1L urine. -Place Foley -Urology consult  AKI- due to acute urinary retention. Cr increased from 0.62>1.78>1.92.  Starting to plateau. 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 Management plans discussed with the patient, family and they are in agreement.  CODE STATUS: full.  TOTAL TIME TAKING CARE OF THIS PATIENT: 35 minutes.    POSSIBLE D/C tomorrow, DEPENDING ON CLINICAL CONDITION.   Berna Spare Lottie Siska M.D on 02/15/2018   Between 7am to 6pm - Pager - [phone]  After 6pm go to [email or web address] - password [email or web address] [facility]  Office  [phone]  CC: Primary care physician; Tonia Ghent, MD  Note: This dictation was prepared with Dragon dictation along with smaller  phrase technology. Any transcriptional errors that result from this process are unintentional.

## 2018-02-15 NOTE — Consult Note (Signed)
4:09 PM   Joseph Parker Mar 03, 1940 [id number]  CC: Urinary retention  HPI: I saw Joseph Parker in consultation for urinary retention from Dr. Brett Albino.  He is a 77 year old male with complex medical problems.  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  He is undergoing chemotherapy as well as radiation.  He is pancytopenic.  He was seen by palliative care today and has been on high-dose pain medications including OxyContin and oxycodone for breakthrough pain.  He was found to have a rising creatinine over the last 48 hours to 1.92 from baseline of 0.6, and renal ultrasound showed distended bladder with 900 cc, and bilateral hydronephrosis.  Ultrasound also showed a possible mass at the base of the bladder, however review of prior CT suggest this is likely just an intravesical median lobe of the prostate.  A Foley catheter was placed with return of yellow urine.  He denies any hematuria or difficulty voiding prior to this hospitalization.   PMH: Past Medical History:  Diagnosis Date  . Cancer ([place])   . Carotid arterial disease ([place])   . Carotid artery disease ([place])   . Chickenpox   . Colon polyps   . COPD (chronic obstructive pulmonary disease) ([place])   . Hyperlipidemia   . Neuroendocrine cancer ([place]) 01/28/2018  . Normal cardiac stress test    06/2013  . Shortness of breath dyspnea   . Urinary frequency   . Urinary hesitancy     Surgical History: Past Surgical History:  Procedure Laterality Date  . CATARACT EXTRACTION, BILATERAL    . COLONOSCOPY    . ESOPHAGOGASTRODUODENOSCOPY    . HERNIA REPAIR     right inquinal - as child  . LUMBAR LAMINECTOMY/DECOMPRESSION MICRODISCECTOMY Left 10/14/2014   Procedure: Left Lumbar three-four extraforaminla microdiskectomy;  Surgeon: Eustace Moore, MD;  Location: [facility];  Service: Neurosurgery;   Laterality: Left;  Left L34 extraforaminal microdiskectomy  . POLYPECTOMY    . PORTA CATH INSERTION N/A 01/31/2018   Procedure: PORTA CATH INSERTION;  Surgeon: Algernon Huxley, MD;  Location: [facility];  Service: Cardiovascular;  Laterality: N/A;  . TONSILLECTOMY     remote - childhood  . UPPER GASTROINTESTINAL ENDOSCOPY       Allergies:  Allergies  Allergen Reactions  . Penicillins Itching, Swelling and Rash    DID THE REACTION INVOLVE: Swelling of the face/tongue/throat, SOB, or low BP? Yes Sudden or severe rash/hives, skin peeling, or the inside of the mouth or nose? Unknown Did it require medical treatment? Yes When did it last happen?30+ years If all above answers are "NO", may proceed with cephalosporin use.   Lavella Lemons [Benzonatate] Swelling    Had wide-spread numbness, throat swelling    Family History: Family History  Problem Relation Age of Onset  . Heart disease Mother        CABG; Valve replacement - died post-op  . Alzheimer's disease Father   . Diabetes Father   . Diabetes Son   . Colon cancer Sister   . Prostate cancer Neg Hx   . Colon polyps Neg Hx     Social History:  reports that he quit smoking about 18 years ago. He has a 20.00 pack-year smoking history. He has never used smokeless tobacco. He reports that he does not drink alcohol or use drugs.  ROS: Negative aside from those  stated in the HPI.  Physical Exam: BP (!) 116/58 (BP Location: Right Arm)   Pulse 92   Temp 97.7 F (36.5 C) (Oral)   Resp 20   Ht 5\' 8"  (1.727 m)   SpO2 95%   BMI 19.22 kg/m    Constitutional: Cachectic, chronically ill-appearing Cardiovascular: No clubbing, cyanosis, or edema. Respiratory: Normal respiratory effort, no increased work of breathing. GI: Abdomen is soft, nontender, nondistended, no abdominal masses GU: No CVA tenderness, Foley with clear yellow urine Psychiatric: Normal mood and affect.  Laboratory Data: Reviewed Urinalysis 12/25  with rare bacteria, 0-5 RBCs, 0-5 WBCs, nitrite negative PSA 12/2017 5.1 (within normal range for age)  Pertinent Imaging: I have personally reviewed the renal and bladder ultrasound dated 02/15/2018 and CT abdomen pelvis with contrast dated 12/28/2017: Ultrasound shows new bilateral hydronephrosis with a distended bladder, mass in the inferior aspect of the bladder likely consistent with a median lobe, however cannot rule out other malignancy.  Bladder was diffusely thickened on prior CT scan.  Assessment & Plan:   In summary, Joseph Parker is a 77 year old frail male with metastatic stage IV neuroendocrine tumor, currently admitted with uncontrolled pain from his multiple metastatic sites.  He is undergoing chemotherapy and radiation.  He is on high-dose narcotics which I suspect precipitated an episode of urinary retention causing his AKI and hydronephrosis.  Anticipate this will resolve with catheter placement.  We also discussed the findings of possible bladder mass on the renal ultrasound.  I discussed that this is more likely a benign process from protrusion of the median lobe of the prostate into the bladder, however we cannot 100% rule out underlying bladder or prostate malignancy.  With his numerous co-morbidities and metastatic stage IV neuroendocrine tumor I recommended deferring further work-up with cystoscopy at this time, and the patient and family are in agreement.  Recommendations 1.  Start Flomax 0.4 mg nightly 2.  Follow-up in urology clinic in 7 to 10 days for voiding trial (arranged)  Call if questions  [place], [place] [address], [place], [place] [address] [phone]

## 2018-02-15 NOTE — Progress Notes (Signed)
Hematology/Oncology Progress Note [facility] Telephone:([phone] [phone]  Patient Care Team: Tonia Ghent, MD as PCP - General (Family Medicine) Eustace Moore, MD as Consulting Physician (Neurosurgery) Telford Nab, RN as Registered Nurse   Name of the patient: Joseph Parker  [id number]  1940/12/14  Date of visit: 02/15/18   INTERVAL HISTORY-  Patient was seen and examined at bedside. Reports pain has improved, pain level at 4/10.  No appetite. Denies nausea or vomiting.  Creatinine rising. US renal was done.  Reports suprapubic fullness, weak urine stream and also dribbling. No fever, chills, hematuria.    Review of systems- Review of Systems  Constitutional: Positive for fatigue. Negative for appetite change, chills, fever and unexpected weight change.  HENT:   Negative for hearing loss and voice change.        Neck pain.   Eyes: Negative for eye problems and icterus.  Respiratory: Negative for chest tightness, cough and shortness of breath.   Cardiovascular: Negative for chest pain and leg swelling.  Gastrointestinal: Negative for abdominal distention and abdominal pain.  Endocrine: Negative for hot flashes.  Genitourinary: Positive for difficulty urinating. Negative for dysuria and frequency.   Musculoskeletal: Negative for arthralgias.  Skin: Negative for itching and rash.  Neurological: Negative for light-headedness and numbness.  Hematological: Negative for adenopathy. Does not bruise/bleed easily.  Psychiatric/Behavioral: Negative for confusion. The patient is nervous/anxious.     Allergies  Allergen Reactions  . Penicillins Itching, Swelling and Rash    DID THE REACTION INVOLVE: Swelling of the face/tongue/throat, SOB, or low BP? Yes Sudden or severe rash/hives, skin peeling, or the inside of the mouth or nose? Unknown Did it require medical treatment? Yes When did it last happen?30+ years If all above answers  are "NO", may proceed with cephalosporin use.   Lavella Lemons [Benzonatate] Swelling    Had wide-spread numbness, throat swelling    Patient Active Problem List   Diagnosis Date Noted  . Palliative care encounter   . Intractable pain 02/11/2018  . Neck pain 02/08/2018  . Hypercalcemia 02/05/2018  . Early satiety 02/05/2018  . Neoplasm related pain 02/05/2018  . Thrush 02/05/2018  . 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 Goals of care, counseling/discussion 01/19/2018  . Bone lesion 01/01/2018  . Anorexia 12/20/2017  . 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 Sciatica 11/27/2017  . Carotid stenosis 04/22/2017  . Chronic obstructive pulmonary disease ([place]) 09/03/2016  . COPD exacerbation ([place]) 01/09/2016  . Colon cancer screening 11/11/2015  . Right hand pain 11/11/2015  . Cough 03/03/2015  . Weight loss 01/06/2015  . Dysuria 10/29/2014  . S/P lumbar microdiscectomy 10/14/2014  . Advance care planning 05/27/2014  . Iliotibial band syndrome 04/07/2014  . CAD (coronary artery disease), native coronary artery 06/11/2013  . Decreased exercise tolerance 06/10/2013  . Nodule of right lung 10/25/2011  . Emphysema lung ([place]) 07/20/2011  . Medicare annual wellness visit, initial 07/20/2011     Past Medical History:  Diagnosis Date  . Cancer ([place])   . Carotid arterial disease ([place])   . Carotid artery disease ([place])   . Chickenpox   . Colon polyps   . COPD (chronic obstructive pulmonary disease) ([place])   . Hyperlipidemia   . 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 Normal cardiac stress test    06/2013  . Shortness of breath dyspnea   . Urinary frequency   . Urinary hesitancy      Past Surgical History:  Procedure Laterality Date  .  CATARACT EXTRACTION, BILATERAL    . COLONOSCOPY    . ESOPHAGOGASTRODUODENOSCOPY    . HERNIA REPAIR     right inquinal - as child  . LUMBAR LAMINECTOMY/DECOMPRESSION MICRODISCECTOMY Left 10/14/2014   Procedure: Left Lumbar three-four  extraforaminla microdiskectomy;  Surgeon: Eustace Moore, MD;  Location: [facility];  Service: Neurosurgery;  Laterality: Left;  Left L34 extraforaminal microdiskectomy  . POLYPECTOMY    . PORTA CATH INSERTION N/A 01/31/2018   Procedure: PORTA CATH INSERTION;  Surgeon: Algernon Huxley, MD;  Location: [facility];  Service: Cardiovascular;  Laterality: N/A;  . TONSILLECTOMY     remote - childhood  . UPPER GASTROINTESTINAL ENDOSCOPY      Social History   Socioeconomic History  . Marital status: Married    Spouse name: Not on file  . Number of children: 5  . Years of education: 67  . Highest education level: Not on file  Occupational History  . Occupation: mfg Librarian, academic    Comment: retired  Scientific laboratory technician  . Financial resource strain: Patient refused  . Food insecurity:    Worry: Patient refused    Inability: Patient refused  . Transportation needs:    Medical: Patient refused    Non-medical: Patient refused  Tobacco Use  . Smoking status: Former Smoker    Packs/day: 0.50    Years: 40.00    Pack years: 20.00    Last attempt to quit: 07/20/1999    Years since quitting: 18.5  . Smokeless tobacco: Never Used  Substance and Sexual Activity  . Alcohol use: No    Alcohol/week: 0.0 standard drinks  . Drug use: No  . Sexual activity: Yes    Birth control/protection: Post-menopausal  Lifestyle  . Physical activity:    Days per week: Patient refused    Minutes per session: Patient refused  . Stress: Patient refused  Relationships  . Social connections:    Talks on phone: Patient refused    Gets together: Patient refused    Attends religious service: Patient refused    Active member of club or organization: Patient refused    Attends meetings of clubs or organizations: Patient refused    Relationship status: Patient refused  . Intimate partner violence:    Fear of current or ex partner: Patient refused    Emotionally abused: Patient refused    Physically abused:  Patient refused    Forced sexual activity: Patient refused  Other Topics Concern  . Not on file  Social History Narrative   HSG. Married '63.    Previous marriage - 3 years/divorce. 5 sons. 7 grandchildren.   Work - Financial planner - retired.    Active in community and in his church   [employer] fan     Family History  Problem Relation Age of Onset  . Heart disease Mother        CABG; Valve replacement - died post-op  . Alzheimer's disease Father   . Diabetes Father   . Diabetes Son   . Colon cancer Sister   . Prostate cancer Neg Hx   . Colon polyps Neg Hx      Current Facility-Administered Medications:  .  0.9 %  sodium chloride infusion, , Intravenous, Continuous, Mayo, Pete Pelt, MD, Last Rate: 75 mL/hr at 02/15/18 1215 .  acetaminophen (TYLENOL) tablet 650 mg, 650 mg, Oral, Q6H PRN **OR** acetaminophen (TYLENOL) suppository 650 mg, 650 mg, Rectal, Q6H PRN, Gouru, Aruna, MD .  albuterol (PROVENTIL) (2.5 MG/3ML) 0.083%  nebulizer solution 2.5 mL, 2.5 mL, Inhalation, Q6H PRN, Gouru, Aruna, MD .  amitriptyline (ELAVIL) tablet 50 mg, 50 mg, Oral, QHS, Gouru, Aruna, MD, 50 mg at 02/14/18 2007 .  aspirin EC tablet 81 mg, 81 mg, Oral, Daily, Gouru, Aruna, MD, 81 mg at 02/15/18 1047 .  dexamethasone (DECADRON) tablet 4 mg, 4 mg, Oral, Daily, Gouru, Aruna, MD, 4 mg at 02/15/18 1047 .  dronabinol (MARINOL) capsule 5 mg, 5 mg, Oral, BID AC, Gouru, Aruna, MD, 5 mg at 02/15/18 1737 .  enoxaparin (LOVENOX) injection 30 mg, 30 mg, Subcutaneous, Q24H, Shari Prows, RPH, 30 mg at 02/14/18 2007 .  feeding supplement (ENSURE ENLIVE) (ENSURE ENLIVE) liquid 237 mL, 237 mL, Oral, TID BM, Mayo, Pete Pelt, MD, 237 mL at 02/15/18 1522 .  gabapentin (NEURONTIN) capsule 100 mg, 100 mg, Oral, TID, Borders, Kirt Boys, NP, 100 mg at 02/15/18 1522 .  HYDROmorphone (DILAUDID) injection 1 mg, 1 mg, Intravenous, Q6H PRN, Vaughan Basta, MD, 1 mg at 02/14/18 0748 .  LORazepam (ATIVAN) injection 0.5  mg, 0.5 mg, Intravenous, Q4H PRN, Gouru, Aruna, MD .  MEDLINE mouth rinse, 15 mL, Mouth Rinse, BID, Gouru, Aruna, MD, 15 mL at 02/15/18 1210 .  mometasone-formoterol (DULERA) 200-5 MCG/ACT inhaler 2 puff, 2 puff, Inhalation, BID, Gouru, Aruna, MD, 2 puff at 02/15/18 1048 .  ondansetron (ZOFRAN) tablet 4 mg, 4 mg, Oral, Q6H PRN **OR** ondansetron (ZOFRAN) injection 4 mg, 4 mg, Intravenous, Q6H PRN, Gouru, Aruna, MD .  oxyCODONE (Oxy IR/ROXICODONE) immediate release tablet 5-10 mg, 5-10 mg, Oral, Q3H PRN, Mayo, Pete Pelt, MD .  oxyCODONE (OXYCONTIN) 12 hr tablet 20 mg, 20 mg, Oral, Q8H, Borders, Joshua R, NP, 20 mg at 02/15/18 1522 .  pantoprazole (PROTONIX) EC tablet 20 mg, 20 mg, Oral, Daily, Borders, Vonna Kotyk R, NP, 20 mg at 02/15/18 1047 .  polyethylene glycol (MIRALAX / GLYCOLAX) packet 17 g, 17 g, Oral, BID PRN, Gouru, Aruna, MD .  polyethylene glycol (MIRALAX / GLYCOLAX) packet 17 g, 17 g, Oral, Daily, Mayo, Pete Pelt, MD, 17 g at 02/15/18 1258 .  prochlorperazine (COMPAZINE) tablet 10 mg, 10 mg, Oral, Q6H PRN, Gouru, Aruna, MD .  senna (SENOKOT) tablet 8.6 mg, 1 tablet, Oral, BID, Vaughan Basta, MD, 8.6 mg at 02/15/18 1047 .  tamsulosin (FLOMAX) capsule 0.4 mg, 0.4 mg, Oral, Daily, Mayo, Pete Pelt, MD, 0.4 mg at 02/15/18 1522 .  tiotropium (SPIRIVA) inhalation capsule ([facility] use ONLY) 18 mcg, 18 mcg, Inhalation, Daily, Gouru, Aruna, MD, 18 mcg at 02/15/18 1048  Facility-Administered Medications Ordered in Other Encounters:  .  HYDROmorphone (DILAUDID) injection 1 mg, 1 mg, Intravenous, Once, Borders, Kirt Boys, NP   Physical exam:  Vitals:   02/14/18 2043 02/15/18 0509 02/15/18 0843 02/15/18 1444  BP: (!) 141/76 (!) 159/74  (!) 116/58  Pulse: 88 (!) 122  92  Resp: 20 20  20   Temp: 98.1 F (36.7 C) 98.3 F (36.8 C) 98.2 F (36.8 C) 97.7 F (36.5 C)  TempSrc:  Oral Oral Oral  SpO2: 95% 96%  95%  Height:       Physical Exam  Constitutional: He is oriented to person, place,  and time. No distress.  HENT:  Head: Normocephalic and atraumatic.  thrush  Eyes: Pupils are equal, round, and reactive to light. EOM are normal. No scleral icterus.  Neck: Normal range of motion. Neck supple.  Cardiovascular: Normal rate and regular rhythm.  No murmur heard. Pulmonary/Chest: Effort normal. No respiratory distress. He  exhibits no tenderness.  Decreased breath sounds bilaterally.   Abdominal: Soft. He exhibits no distension.  Suprapubic fullness.  Musculoskeletal: Normal range of motion.        General: No edema.  Neurological: He is alert and oriented to person, place, and time.  Skin: Skin is warm and dry.  Psychiatric: Affect normal.       CMP Latest Ref Rng & Units 02/15/2018  Glucose 70 - 99 mg/dL 101(H)  BUN 8 - 23 mg/dL 30(H)  Creatinine 0.61 - 1.24 mg/dL 1.92(H)  Sodium 135 - 145 mmol/L 136  Potassium 3.5 - 5.1 mmol/L 3.8  Chloride 98 - 111 mmol/L 103  CO2 22 - 32 mmol/L 23  Calcium 8.9 - 10.3 mg/dL 8.0(L)  Total Protein 6.5 - 8.1 g/dL -  Total Bilirubin 0.3 - 1.2 mg/dL -  Alkaline Phos 38 - 126 U/L -  AST 15 - 41 U/L -  ALT 0 - 44 U/L -   CBC Latest Ref Rng & Units 02/15/2018  WBC 4.0 - 10.5 K/uL 0.8(LL)  Hemoglobin 13.0 - 17.0 g/dL 8.9(L)  Hematocrit 39.0 - 52.0 % 26.8(L)  Platelets 150 - 400 K/uL 61(L)   RADIOGRAPHIC STUDIES: I have personally reviewed the radiological images as listed and agreed with the findings in the report.  Dg Chest 2 View  Result Date: 01/20/2018 CLINICAL DATA:  Shortness of breath and left upper chest pain in a patient with lung carcinoma. EXAM: CHEST - 2 VIEW COMPARISON:  PET CT scan 01/11/2018. PA and lateral chest 09/01/2016. FINDINGS: The lungs are emphysematous. No consolidative process, pneumothorax or effusion. Mass lesion in the left mediastinum is identified as seen on the prior exams. Heart size is normal. No acute bony abnormality. Destructive lesion in the left transverse process of T2 is not visible on  this exam. No acute bony abnormality is seen. IMPRESSION: No acute disease. Mediastinal mass lesion on the left as seen on prior exams. Emphysema. Electronically Signed   By: Inge Rise M.D.   On: 01/20/2018 14:40   Ct Head Wo Contrast  Result Date: 02/08/2018 CLINICAL DATA:  Acute right neck pain and headache beginning after radiation therapy yesterday. History of poorly differentiated neuroendocrine cancer. EXAM: CT HEAD WITHOUT CONTRAST CT CERVICAL SPINE WITHOUT CONTRAST TECHNIQUE: Multidetector CT imaging of the head and cervical spine was performed following the standard protocol without intravenous contrast. Multiplanar CT image reconstructions of the cervical spine were also generated. COMPARISON:  Brain MRI 01/09/2018.  PET-CT 01/11/2018. FINDINGS: CT HEAD FINDINGS Brain: There is no evidence of acute infarct, intracranial hemorrhage, mass, midline shift, or extra-axial fluid collection. The ventricles and sulci are within normal limits for age. Scattered cerebral white matter hypodensities are nonspecific but compatible with mild chronic small vessel ischemic disease. Vascular: Calcified atherosclerosis at the skull base. No hyperdense vessel. Skull: 3.5 cm destructive skull base lesion involving the right occipital condyle, petrous ridge, and clivus, increased in size from the prior MRI. Scattered subcentimeter lesions elsewhere in the skull. Sinuses/Orbits: Visualized paranasal sinuses and mastoid air cells are clear. Bilateral cataract extraction is noted. Other: None. CT CERVICAL SPINE FINDINGS Alignment: Slight right convex curvature of the cervical spine. No significant listhesis. Skull base and vertebrae: Right-sided skull base lesion as above. 4 cm destructive mass involving the left-sided posterior elements at T2. 1 cm lytic lesion in the C7 vertebral body. No acute fracture Soft tissues and spinal canal: Left-sided epidural tumor at T2 without evidence of high-grade spinal stenosis. No  prevertebral  swelling. Disc levels: Moderate disc space narrowing and degenerative endplate spurring from M5-7 to C7-T1 with moderate to severe multilevel neural foraminal stenosis. Upper chest: Centrilobular emphysema in the lung apices. Other: Carotid artery atherosclerosis. IMPRESSION: 1. No evidence of acute intracranial abnormality. 2. Osseous metastatic disease with significant interval enlargement of a destructive right skull base mass. 3. No acute osseous abnormality identified in the cervical spine. 4. 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 5. Metastasis involving the T2 posterior elements with left lateral epidural tumor. Electronically Signed   By: Logan Bores M.D.   On: 02/08/2018 13:09   Ct Angio Chest Pe W And/or Wo Contrast  Result Date: 01/20/2018 CLINICAL DATA:  Shortness of breath and left chest pain today. History of lung cancer. EXAM: CT ANGIOGRAPHY CHEST WITH CONTRAST TECHNIQUE: Multidetector CT imaging of the chest was performed using the standard protocol during bolus administration of intravenous contrast. Multiplanar CT image reconstructions and MIPs were obtained to evaluate the vascular anatomy. CONTRAST:  75 mL OMNIPAQUE IOHEXOL 350 MG/ML SOLN COMPARISON:  PET CT scan 01/11/2018. FINDINGS: Cardiovascular: Satisfactory opacification of the pulmonary arteries to the segmental level. No evidence of pulmonary embolism. Normal heart size. No pericardial effusion. Mediastinum/Nodes: Again seen is a mass lesion centered in the mediastinum on the left which measures 5.4 x 3.9 cm on image 43, unchanged. As noted on the prior examination, the lesion is centered in the AP window. It abuts the right main pulmonary artery without invading it. The mass also surrounds the interlobar branch to the anterior left upper lobe. No right hilar lymphadenopathy is identified. The esophagus and thyroid gland appear normal. Lungs/Pleura: The patient has a  small left pleural effusion which is new since the prior exam. The lungs demonstrate extensive centrilobular emphysema. A left upper lobe nodule on image 42 measuring 0.7 cm is unchanged. There also a few scattered ground-glass attenuation opacities in the anterior left upper lobe which are unchanged. The right lung is clear. Upper Abdomen: No acute abnormality. Cyst in the right kidney noted. Musculoskeletal: Destructive lesion in the left T2 transverse process is identified and measures approximately 3.5 x 2.0 cm on image 11 of series 7, not notably changed. Also seen is a large destructive lesion in the left side of the T12 vertebral body extending into the left pedicle, unchanged. No new bony abnormality is identified. Review of the MIP images confirms the above findings. IMPRESSION: Negative for pulmonary embolus or acute cardiopulmonary disease. Small left pleural effusion is new since the prior exam. No change in a left mediastinal mass lesion with metastatic deposits in the left side of T12 and left transverse process of T2. Aortic Atherosclerosis (ICD10-I70.0) and Severe emphysema (ICD10-J43.9). Electronically Signed   By: Inge Rise M.D.   On: 01/20/2018 16:40   Ct Cervical Spine Wo Contrast  Result Date: 02/08/2018 CLINICAL DATA:  Acute right neck pain and headache beginning after radiation therapy yesterday. History of poorly differentiated neuroendocrine cancer. EXAM: CT HEAD WITHOUT CONTRAST CT CERVICAL SPINE WITHOUT CONTRAST TECHNIQUE: Multidetector CT imaging of the head and cervical spine was performed following the standard protocol without intravenous contrast. Multiplanar CT image reconstructions of the cervical spine were also generated. COMPARISON:  Brain MRI 01/09/2018.  PET-CT 01/11/2018. FINDINGS: CT HEAD FINDINGS Brain: There is no evidence of acute infarct, intracranial hemorrhage, mass, midline shift, or extra-axial fluid collection. The ventricles and sulci are within normal  limits for age. Scattered cerebral white matter hypodensities are nonspecific but  compatible with mild chronic small vessel ischemic disease. Vascular: Calcified atherosclerosis at the skull base. No hyperdense vessel. Skull: 3.5 cm destructive skull base lesion involving the right occipital condyle, petrous ridge, and clivus, increased in size from the prior MRI. Scattered subcentimeter lesions elsewhere in the skull. Sinuses/Orbits: Visualized paranasal sinuses and mastoid air cells are clear. Bilateral cataract extraction is noted. Other: None. CT CERVICAL SPINE FINDINGS Alignment: Slight right convex curvature of the cervical spine. No significant listhesis. Skull base and vertebrae: Right-sided skull base lesion as above. 4 cm destructive mass involving the left-sided posterior elements at T2. 1 cm lytic lesion in the C7 vertebral body. No acute fracture Soft tissues and spinal canal: Left-sided epidural tumor at T2 without evidence of high-grade spinal stenosis. No prevertebral swelling. Disc levels: Moderate disc space narrowing and degenerative endplate spurring from W4-3 to C7-T1 with moderate to severe multilevel neural foraminal stenosis. Upper chest: Centrilobular emphysema in the lung apices. Other: Carotid artery atherosclerosis. IMPRESSION: 1. No evidence of acute intracranial abnormality. 2. Osseous metastatic disease with significant interval enlargement of a destructive right skull base mass. 3. No acute osseous abnormality identified in the cervical spine. 4. 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 5. Metastasis involving the T2 posterior elements with left lateral epidural tumor. Electronically Signed   By: Logan Bores M.D.   On: 02/08/2018 13:09   US Renal  Result Date: 02/15/2018 CLINICAL DATA:  Urinary incontinence EXAM: RENAL / URINARY TRACT ULTRASOUND COMPLETE COMPARISON:  CT abdomen pelvis of 12/28/2017 FINDINGS: Right Kidney: Renal  measurements: 12.6 x 5.3 x 5.6 cm. = volume: 194 mL. There is moderate right hydronephrosis present, new since the CT of 12/28/2016. Renal cysts are present primarily in the upper pole of 2.2 and 1.5 cm in diameter respectively. No solid renal lesion is seen. The echogenicity of the renal parenchyma may be slightly increased and correlation with renal laboratory values is recommended. Left Kidney: Renal measurements: 11.6 x 6.3 x 4.9 cm. = volume: One hundred ninety-one mL. Moderate hydronephrosis also noted involving the left kidney. Renal cysts are present of 2.8 and 2.5 cm in diameter respectively. The echogenicity of the left kidney may be slightly increased. Bladder: The urinary bladder is well distended. There does appear to be some prominence of the prostate gland. However the soft tissue noted in the posterior inferior aspect of the urinary bladder could be due to either indentation by prominent median lobe of the prostate versus a separate bladder carcinoma. I did review the recent CT and it is difficult to determine which may be the cause. This area within the posterior inferior aspect of the urinary bladder measures 4.5 x 2.2 x 2.4 cm. Prevoid urine volume of 924 cubic cm is measured. The patient could not void however for postvoid volume to be obtained. IMPRESSION: 1. Moderate bilateral hydronephrosis, new since the CT of 12/28/2017. 2. Soft tissue structure in the posterior inferior aspect of the urinary bladder which could represent either a primary urinary bladder carcinoma or possibly indentation by a prominent median lobe of the prostate. It is difficult to distinguish between the two entities on this ultrasound or prior CT of the abdomen pelvis. 3. Questionable slightly echogenic renal parenchyma bilaterally. 4. Bilateral renal cysts.  No solid renal lesion is seen. Electronically Signed   By: Ivar Drape M.D.   On: 02/15/2018 10:25   Ct Biopsy  Result Date: 01/22/2018 INDICATION: Lytic bone  lesions and lung mass EXAM: CT BIOPSY  MEDICATIONS: None. ANESTHESIA/SEDATION: Fentanyl 100 mcg IV; Versed 1 mg IV Moderate Sedation Time:  6 minutes The patient was continuously monitored during the procedure by the interventional radiology nurse under my direct supervision. FLUOROSCOPY TIME:  Fluoroscopy Time:  minutes  seconds ( mGy). COMPLICATIONS: None immediate. PROCEDURE: Informed written consent was obtained from the patient after a thorough discussion of the procedural risks, benefits and alternatives. All questions were addressed. Maximal Sterile Barrier Technique was utilized including caps, mask, sterile gowns, sterile gloves, sterile drape, hand hygiene and skin antiseptic. A timeout was performed prior to the initiation of the procedure. Under CT guidance, a(n) 17 gauge guide needle was advanced into the right iliac bone lesion. Subsequently, 3 18 gauge core biopsies were obtained. The guide needle was removed. Post biopsy images demonstrate no hemorrhage. Patient tolerated the procedure well without complication. Vital sign monitoring by nursing staff during the procedure will continue as patient is in the special procedures unit for post procedure observation. FINDINGS: The images document guide needle placement within the right iliac bone lesion. Post biopsy images demonstrate no hemorrhage. IMPRESSION: Successful CT-guided core biopsy of a right iliac bone lesion. Electronically Signed   By: Marybelle Killings M.D.   On: 01/22/2018 12:48    Assessment and plan-  Patient is a 77 y.o. male with metastatic high grade neuroendocrine carcinoma, with involvement of skull base causing intractable pain admitted for pain control.   # AKI, US renal was independently reviewed and discussed with patient and family members. Most likely postrenal obstruction due to enlarged prostate. He has been on high dose narcotics which may have triggered this.  Talked to patient's RN and advise bladder scan, if remaining  >400cc, place urinary catheter. Start Flomax.  Urology has been consulted.   # Intractable neck pain due to enlarging skull base lesion.  Pain appears to be better controlled with current regimen. On Oxycontin 20mg  Q8 hours, along with Oxycodone 5-10mg Q3h PRN, dilaudid 1mg  IV Q6h Prn. Started on palliative RT to skull base. Also on Dexamethasone 4mg  daily, gabapentin 100mg  TID.   #Metastatic neuroendocrine cancer, s/p 1 cycle of chemotherapy/immunotherapy along with prophylactic GCSF support with  Udenyca. Recommend continue symptom management to get pain controlled, and see if symptoms can be controlled over the next 2 weeks before making decision of hospice. Wife appreciates discussion and agree with plan.   # Leukopenia, due to chemotherapy. Continue monitor check cbc/ differential in AM.  # Thrombocytopenia, due to chemotherapy. No active bleeding. Continue monitor cbc daily.  On Lovenox 30mg  daily for DVT prophylaxis. If platelet count drop beloe 50,000, please hold Lovenox.   # Thrush, Nystatin oral rinse swish and spit.  # Continue Marinol 5mg  BID for anorexia.  # Anxiety, continue Ativan PRN.  Recommend PT/OT evaluation  Thank you for allowing me to participate in the care of this patient. Dr.Finnegan is on call covering the weekend.  Total face to face encounter time for this patient visit was 25 min. >50% of the time was  spent in counseling and coordination of care.  Earlie Server, MD, PhD Hematology Oncology [facility] at [facility] Pager- [phone] 02/15/2018

## 2018-02-16 LAB — BASIC METABOLIC PANEL
Anion gap: 7 (ref 5–15)
BUN: 18 mg/dL (ref 8–23)
CO2: 26 mmol/L (ref 22–32)
Calcium: 8 mg/dL — ABNORMAL LOW (ref 8.9–10.3)
Chloride: 105 mmol/L (ref 98–111)
Creatinine, Ser: 0.66 mg/dL (ref 0.61–1.24)
GFR calc Af Amer: >60 mL/min (ref >60)
Glucose, Bld: 89 mg/dL (ref 70–99)
Potassium: 3.9 mmol/L (ref 3.5–5.1)
Sodium: 138 mmol/L (ref 135–145)

## 2018-02-16 LAB — CBC WITH DIFFERENTIAL/PLATELET
Abs Immature Granulocytes: 0 10*3/uL (ref 0.00–0.07)
Band Neutrophils: 10 %
Basophils Absolute: 0 10*3/uL (ref 0.0–0.1)
Basophils Relative: 0 %
EOS ABS: 0 10*3/uL (ref 0.0–0.5)
Eosinophils Relative: 0 %
HCT: 23.5 % — ABNORMAL LOW (ref 39.0–52.0)
Hemoglobin: 7.9 g/dL — ABNORMAL LOW (ref 13.0–17.0)
Lymphocytes Relative: 20 %
Lymphs Abs: 0.4 10*3/uL — ABNORMAL LOW (ref 0.7–4.0)
MCH: 28.7 pg (ref 26.0–34.0)
MCHC: 33.6 g/dL (ref 30.0–36.0)
MCV: 85.5 fL (ref 80.0–100.0)
MONOS PCT: 10 %
Monocytes Absolute: 0.2 10*3/uL (ref 0.1–1.0)
NEUTROS ABS: 1.3 10*3/uL — AB (ref 1.7–7.7)
Neutrophils Relative %: 60 %
Platelets: 40 10*3/uL — ABNORMAL LOW (ref 150–400)
RBC: 2.75 MIL/uL — ABNORMAL LOW (ref 4.22–5.81)
RDW: 13.2 % (ref 11.5–15.5)
Smear Review: NORMAL
WBC: 1.8 10*3/uL — ABNORMAL LOW (ref 4.0–10.5)
nRBC: 0 % (ref 0.0–0.2)

## 2018-02-16 LAB — GLUCOSE, CAPILLARY
Glucose-Capillary: 83 mg/dL (ref 70–99)
Glucose-Capillary: 134 mg/dL — ABNORMAL HIGH (ref 70–99)

## 2018-02-16 MED ORDER — SENNA 8.6 MG PO TABS: 1.0000 | ORAL_TABLET | Freq: Two times a day (BID) | ORAL | 0 refills | Status: AC

## 2018-02-16 MED ORDER — HEPARIN SOD (PORK) LOCK FLUSH 100 UNIT/ML IV SOLN
500.0000 [IU] | Freq: Once | INTRAVENOUS | Status: AC
  Administered 2018-02-16: 16:00:00 500 [IU] via INTRAVENOUS
  Filled 2018-02-16: qty 5

## 2018-02-16 MED ORDER — GABAPENTIN 100 MG PO CAPS: 100.0000 mg | ORAL_CAPSULE | Freq: Three times a day (TID) | ORAL | 0 refills | Status: AC

## 2018-02-16 MED ORDER — OXYCODONE ER 18 MG PO C12A: 18.0000 mg | EXTENDED_RELEASE_CAPSULE | Freq: Two times a day (BID) | ORAL | 0 refills | Status: DC

## 2018-02-16 MED ORDER — TAMSULOSIN HCL 0.4 MG PO CAPS: 0.4000 mg | ORAL_CAPSULE | Freq: Every day | ORAL | 0 refills | Status: AC

## 2018-02-16 MED ORDER — OXYCODONE HCL 5 MG PO TABS: 5.0000 mg | ORAL_TABLET | ORAL | 0 refills | Status: DC | PRN

## 2018-02-16 NOTE — Evaluation (Signed)
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  Acute urinary retention-likely due to pain meds.  Renal ultrasound with moderate bilateral hydronephrosis and possible soft tissue structure in the posterior urinary bladder.  Catheter placed  Clinical Impression  Pt did relatively well with PT assessment as he was able to walk ~100 ft, negotiate up/down 6 steps and showed functional balance and strength.  He had some issues with coordination and quality of motion along with some weakness, but overall was functional and safe to go home with the assist that is available.  Pt has been having a harder and harder time with mobility, etc at home and will benefit from HHPT to address these issues in returning home.      Follow Up Recommendations Home health PT    Equipment Recommendations  None recommended by PT(instructed family to encourage [facility] use )    Recommendations for Other Services       Precautions / Restrictions Precautions Precautions: Fall Restrictions Weight Bearing Restrictions: No      Mobility  Bed Mobility Overal bed mobility: (Pt in recliner on arrival, not tested)                Transfers Overall transfer level: Modified independent Equipment used: None             General transfer comment: Pt able to rise and maintain balance w/o assist  Ambulation/Gait Ambulation/Gait assistance: Min guard Gait Distance (Feet): 100 Feet Assistive device: None       General Gait Details: Pt able to ambulate with good confidence, though he did have some quality of motion, feel strike consistency issues with no LOBs.  Pt had fatigue with the effort with HR into the 120s and O2 in the  low 90s with the effort.   Stairs Stairs: Yes Stairs assistance: Supervision Stair Management: One rail Right Number of Stairs: 6 General stair comments: Able to negotiate up/down steps w/o assist but definite need of UEs  Wheelchair Mobility    Modified Rankin (Stroke Patients Only)       Balance Overall balance assessment: Modified Independent                                           Pertinent Vitals/Pain Pain Assessment: 0-10 Pain Score: 5  Pain Location: head ache/R neck pain Pain Descriptors / Indicators: Headache Pain Intervention(s): Monitored during session    Home Living Family/patient expects to be discharged to:: Private residence Living Arrangements: Spouse/significant other   Type of Home: House Home Access: Stairs to enter Entrance Stairs-Rails: Can reach both Entrance Stairs-Number of Steps: 6   Home Equipment: Shower seat;Cane - single point      Prior Function Level of Independence: Independent         Comments: Pt has had more issues in the last 6 weeks, but is still able to manage in the home w/o a lot of assist     Hand Dominance        Extremity/Trunk Assessment   Upper Extremity Assessment Upper Extremity Assessment: Overall WFL for tasks assessed    Lower Extremity Assessment Lower Extremity  Assessment: Overall WFL for tasks assessed;Generalized weakness       Communication   Communication: No difficulties(weak voice since neck/throat radiation)  Cognition Arousal/Alertness: Awake/alert Behavior During Therapy: WFL for tasks assessed/performed Overall Cognitive Status: Within Functional Limits for tasks assessed                                        General Comments      Exercises     Assessment/Plan    PT Assessment Patient needs continued PT services  PT Problem List Decreased strength;Decreased range of motion;Decreased activity tolerance;Decreased coordination;Decreased  mobility;Decreased balance;Decreased knowledge of use of DME;Decreased safety awareness;Cardiopulmonary status limiting activity       PT Treatment Interventions Gait training    PT Goals (Current goals can be found in the Care Plan section)  Acute Rehab PT Goals Patient Stated Goal: Go home PT Goal Formulation: With patient Time For Goal Achievement: 03/02/18 Potential to Achieve Goals: Good    Frequency Min 2X/week   Barriers to discharge        Co-evaluation               AM-PAC PT "6 Clicks" Mobility  Outcome Measure Help needed turning from your back to your side while in a flat bed without using bedrails?: None Help needed moving from lying on your back to sitting on the side of a flat bed without using bedrails?: None Help needed moving to and from a bed to a chair (including a wheelchair)?: None Help needed standing up from a chair using your arms (e.g., wheelchair or bedside chair)?: None Help needed to walk in hospital room?: A Little Help needed climbing 3-5 steps with a railing? : A Little 6 Click Score: 22    End of Session Equipment Utilized During Treatment: Gait belt Activity Tolerance: Patient tolerated treatment well Patient left: with call bell/phone within reach;in chair   PT Visit Diagnosis: Muscle weakness (generalized) (M62.81);Difficulty in walking, not elsewhere classified (R26.2)    Time: 1030-1057 PT Time Calculation (min) (ACUTE ONLY): 27 min   Charges:   PT Evaluation $PT Eval Low Complexity: 1 Low          Kreg Shropshire, DPT 02/16/2018, 4:29 PM

## 2018-02-16 NOTE — Discharge Summary (Signed)
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 Cancer ([place])   . Carotid arterial disease ([place])   . Carotid artery disease ([place])   . Chickenpox   . Colon polyps   . COPD (chronic obstructive pulmonary disease) ([place])   . Hyperlipidemia   . Neuroendocrine cancer ([place]) 01/28/2018  . Normal cardiac stress test    06/2013  . Shortness of breath dyspnea   . Urinary frequency   . Urinary hesitancy    HOSPITAL COURSE:   Joseph Parker is a 77 year old male with a history of metastatic neuroendocrine cancer who presented to the ED with intractable cancer related pain.  He was admitted for further management.  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   -Initially requiring IV pain medicine, but p.o. pain med doses were titrated and patient was able to go without IV pain medicine for 48 hours prior to discharge -Palliative radiation started at the cancer center -Continued dexamethasone, meloxicam, and amitriptyline  Pancytopenia- patient has had significant drops in all cell lines.  Likely due to chemo.  Anemia panel unremarkable. -Discussed with oncology, who anticipate that this will likely improve in 7-10 days -Needs CBC rechecked as an outpatient  Acute urinary  retention-likely due prostate enlargement, exacerbated by pain meds.   -Renal ultrasound with moderate bilateral hydronephrosis and possible soft tissue structure in the posterior urinary bladder.   -Bladder scan with >1L urine. -Indwelling Foley placed -Urology consulted-recommended starting Flomax, leaving Foley in place for 7 to 10 days, and following up in the urology office in 1 week for voiding trial  AKI- due to acute urinary retention.  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  Also ordered home health RN for help with Foley care.  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 Penicillins Itching, Swelling and Rash    DID THE REACTION INVOLVE: Swelling of the face/tongue/throat, SOB, or low BP? Yes Sudden or severe rash/hives, skin peeling, or the inside of the mouth or nose? Unknown Did it require medical treatment? Yes When did it last happen?30+ years If all above answers are "NO", may proceed with cephalosporin use.   Lavella Lemons [Benzonatate] Swelling    Had wide-spread numbness, throat swelling   DISCHARGE MEDICATIONS:  Allergies as of 02/16/2018      Reactions   Penicillins Itching, Swelling, Rash   DID THE REACTION INVOLVE: Swelling of the face/tongue/throat, SOB, or low BP? Yes Sudden or severe rash/hives, skin peeling, or the  inside of the mouth or nose? Unknown Did it require medical treatment? Yes When did it last happen?30+ years If all above answers are "NO", may proceed with cephalosporin use.   Tessalon [benzonatate] Swelling   Had wide-spread numbness, throat swelling      Medication List    STOP taking these medications   dronabinol 5 MG capsule Commonly known as:  MARINOL   HYDROcodone-acetaminophen 5-325 MG tablet Commonly known as:  NORCO     TAKE these medications   albuterol 108 (90 Base) MCG/ACT inhaler Commonly known as:  PROVENTIL HFA;VENTOLIN HFA Inhale 2 puffs into the lungs every 6 (six) hours as needed for wheezing or shortness of breath.   amitriptyline 50 MG tablet Commonly known as:  ELAVIL Take 1 tablet (50 mg total) by mouth at bedtime.   aspirin EC 81 MG tablet Take 1 tablet (81 mg total) by mouth daily.   dexamethasone 4 MG tablet Commonly known as:  DECADRON Take 1 tablet (4 mg total) by mouth daily.   Fluticasone-Salmeterol 250-50 MCG/DOSE Aepb Commonly known as:  ADVAIR DISKUS Inhale 1 puff into the lungs 2 (two) times daily.   gabapentin 100 MG capsule Commonly known as:  NEURONTIN Take 1 capsule (100 mg total) by mouth 3 (three) times daily. What changed:  when to take this   lidocaine-prilocaine cream Commonly known as:  EMLA Apply to affected area once   meloxicam 15 MG tablet Commonly known as:  MOBIC Take 1 tablet (15 mg total) by mouth daily.   ondansetron 8 MG tablet Commonly known as:  ZOFRAN Take 1 tablet (8 mg total) by mouth 2 (two) times daily as needed for refractory nausea / vomiting. Start on day 3 after carboplatin chemo.   oxyCODONE 5 MG immediate release tablet Commonly known as:  Oxy IR/ROXICODONE Take 1-2 tablets (5-10 mg total) by mouth every 3 (three) hours as needed for moderate pain or breakthrough pain.   oxyCODONE ER 18 MG C12a Commonly known as:  XTAMPZA ER Take 18 mg by mouth every 12 (twelve) hours. What  changed:    medication strength  how much to take   polyethylene glycol powder powder Commonly known as:  GLYCOLAX/MIRALAX Take 17 g by mouth 2 (two) times daily as needed.   prochlorperazine 10 MG tablet Commonly known as:  COMPAZINE Take 1 tablet (10 mg total) by mouth every 6 (six) hours as needed (Nausea or vomiting).   senna 8.6 MG Tabs tablet Commonly known as:  SENOKOT Take 1 tablet (8.6 mg total) by mouth 2 (two) times daily.   tamsulosin 0.4 MG Caps capsule Commonly known as:  FLOMAX Take 1 capsule (0.4 mg total) by mouth daily.        DISCHARGE INSTRUCTIONS:  1.  Follow-up with PCP in 5 days 2.  Follow-up with oncology in 1 week 3.  Needs CBC rechecked as an outpatient 4.  Continue palliative radiation treatment as per schedule DIET:  Regular diet DISCHARGE CONDITION:  Stable ACTIVITY:  Activity as tolerated OXYGEN:  Home Oxygen: No.  Oxygen Delivery: room air DISCHARGE LOCATION:  home   If you experience worsening of your admission symptoms, develop shortness of breath, life threatening emergency, suicidal or homicidal thoughts you must seek medical attention immediately by  calling 911 or calling your MD immediately  if symptoms less severe.  You Must read complete instructions/literature along with all the possible adverse reactions/side effects for all the Medicines you take and that have been prescribed to you. Take any new Medicines after you have completely understood and accpet all the possible adverse reactions/side effects.   Please note  You were cared for by a hospitalist during your hospital stay. If you have any questions about your discharge medications or the care you received while you were in the hospital after you are discharged, you can call the unit and asked to speak with the hospitalist on call if the hospitalist that took care of you is not available. Once you are discharged, your primary care physician will handle any further medical  issues. Please note that NO REFILLS for any discharge medications will be authorized once you are discharged, as it is imperative that you return to your primary care physician (or establish a relationship with a primary care physician if you do not have one) for your aftercare needs so that they can reassess your need for medications and monitor your lab values.    On the day of Discharge:  VITAL SIGNS:  Blood pressure (!) 135/51, pulse 89, temperature 98 F (36.7 C), temperature source Oral, resp. rate 18, height 5\' 8"  (1.727 m), SpO2 94 %. PHYSICAL EXAMINATION:  GENERAL:  77 y.o.-year-old patient lying in the bed with no acute distress. Thin-appearing. EYES: Pupils equal, round, reactive to light and accommodation. No scleral icterus. Extraocular muscles intact.  HEENT: Head atraumatic, normocephalic. Oropharynx and nasopharynx clear.  NECK:  Supple, no jugular venous distention. No thyroid enlargement, no tenderness.  LUNGS: Normal breath sounds bilaterally, no wheezing, rales,rhonchi or crepitation. No use of accessory muscles of respiration.  CARDIOVASCULAR: RRR, S1, S2 normal. No murmurs, rubs, or gallops.  ABDOMEN: Soft, non-tender, non-distended. Bowel sounds present. No organomegaly or mass.  EXTREMITIES: No pedal edema, cyanosis, or clubbing.  NEUROLOGIC: Cranial nerves II through XII are intact. Muscle strength 5/5 in all extremities. Sensation intact. Gait not checked.  PSYCHIATRIC: The patient is alert and oriented x 3.  SKIN: No obvious rash, lesion, or ulcer.  DATA REVIEW:   CBC Recent Labs  Lab 02/16/18 0440  WBC 1.8*  HGB 7.9*  HCT 23.5*  PLT 40*    Chemistries  Recent Labs  Lab 02/10/18 0846  02/12/18 0538  02/16/18 0440  NA 140  --  139   < > 138  K 4.0  --  3.3*   < > 3.9  CL 110  --  106   < > 105  CO2 24  --  27   < > 26  GLUCOSE 99  --  86   < > 89  BUN 21  --  18   < > 18  CREATININE 0.64   < > 0.62   < > 0.66  CALCIUM 7.8*  --  7.9*   < > 8.0*    MG 2.4  --   --   --   --   AST  --   --  21  --   --   ALT  --   --  11  --   --   ALKPHOS  --   --  76  --   --   BILITOT  --   --  0.8  --   --    < > = values in this interval not  displayed.     Microbiology Results  Results for orders placed or performed in visit on 10/28/14  Urine culture     Status: None   Collection Time: 10/28/14  2:45 PM  Result Value Ref Range Status   Colony Count NO GROWTH  Final   Organism ID, Bacteria NO GROWTH  Final    RADIOLOGY:  No results found.   Management plans discussed with the patient, family and they are in agreement.  CODE STATUS: Full Code   TOTAL TIME TAKING CARE OF THIS PATIENT: 45 minutes.    Berna Spare Mayo M.D on 02/16/2018 at 5:33 PM  Between 7am to 6pm - Pager - [phone]  After 6pm go to [email or web address] - password [email or web address] [facility]  [employer] [place] [facility]  Office  [phone]  CC: Primary care physician; Joseph Ghent, MD   Note: This dictation was prepared with Dragon dictation along with smaller phrase technology. Any transcriptional errors that result from this process are unintentional.

## 2018-02-16 NOTE — Care Management Note (Signed)
Case Management Note  Patient Details  Name: Joseph Parker MRN: [id number] Date of Birth: Dec 03, 1940  Subjective/Objective:   Patient to be discharged per MD order. Orders in place for home health services. Patient lives with his spouse and is currently undergoing radiation treatments at the cancer center. Per Altha Harm, NP with palliative the patient will follow at the [facility] for outpatient palliative. As far as home health they are agreeable. CMS Medicare.gov Compare Post Acute Care list reviewed with patient, spouse and son. There are familiar with [facility] and prefer to use them. Referral placed with Jerm[facility]. There are no DME needs. Patient to d/c with foley and the primary RN will assist with education. Family to transport.                    Action/Plan:   Expected Discharge Date:  02/16/18               Expected Discharge Plan:  [place]  In-House Referral:     Discharge planning Services  CM Consult  Post Acute Care Choice:  Home Health Choice offered to:     DME Arranged:    DME Agency:     HH Arranged:  RN, PT, OT, Nurse's Aide Joseph Parker Agency:  [facility] [facility]  Status of Service:  Completed, signed off  If discussed at [place] of Stay Meetings, dates discussed:    Additional Comments:  Kathyrn Drown Zandyr Barnhill, RN 02/16/2018, 1:24 PM

## 2018-02-16 NOTE — Evaluation (Signed)
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  Has not required any additional doses of IV pain meds today.-Acute urinary retention-likely due to pain meds.  Renal ultrasound with moderate bilateral hydronephrosis and possible soft tissue structure in the posterior urinary bladder.  Bladder scan with >1L urine. - foley catheter was place    Clinical Impression   Pt and wife seen at bedside  - both very anxious about being discharge home this date with catheter in place and next radiation session not until Monday. Pt willing to get up with OT - pain about 6/10 and stayed same thru out - willing to sit up in chair. Pt was CG and little unsteady with ambulation to bathroom and dizzy with change of position - pt ed on waiting little when he change positions until dizziness subside - prior to moving again. Pt was SBA for LB dressing and bathing wife assist . Depending on his fatigue level with chemo - he needs some days more assistance with ADLs than other days. Wife assist as needed. Would recommend OT for energy conservation and modifications or ed on decreasing  fall risk and increase safety at home. This OT will follow up with him on the 8th Jan in [place] center - if he is discharge today  From acute care.    Follow Up Recommendations  (HHPT)    Equipment Recommendations  3 in 1 bedside commode;Tub/shower seat;Toilet riser    Recommendations for Other Services       Precautions / Restrictions Precautions Precautions: Fall Restrictions Weight Bearing Restrictions: No      Mobility Bed Mobility Overal bed mobility: Independent                Transfers Overall transfer level: (CG to  bathroom- unsteady) Equipment used: (HHA)                  Balance                                           ADL either performed or assessed with clinical judgement   ADL                                         General ADL Comments: Pt take several rest breaks - but wife assist with LB dressing as needed , UB and grooming I , eathing Independent; bathing assisted as needed depending on his fatigue level     Vision Baseline Vision/History: No visual deficits       Perception     Praxis      Pertinent Vitals/Pain Pain Assessment: 0-10 Pain Score: 6  Pain Descriptors / Indicators: Headache Pain Intervention(s): Monitored during session     Hand Dominance     Extremity/Trunk Assessment Upper Extremity Assessment Upper Extremity Assessment: (Bilateral UE AROM and strength WFL)           Communication     Cognition Arousal/Alertness: Awake/alert Behavior During Therapy: WFL for tasks assessed/performed Overall Cognitive Status: Within Functional Limits  for tasks assessed                                     General Comments       Exercises     Shoulder Instructions      Home Living Family/patient expects to be discharged to:: Private residence Living Arrangements: Spouse/significant other   Type of Home: House             Bathroom Shower/Tub: Teacher, early years/pre: Standard Bathroom Accessibility: Yes   Home Equipment: Shower seat(hand held shower)          Prior Functioning/Environment Level of Independence: Independent                 OT Problem List: Decreased knowledge of use of DME or AE;Other (comment);Decreased safety awareness(energy conservation )      OT Treatment/Interventions: Self-care/ADL training;Patient/family education;Energy conservation;DME and/or AE instruction    OT Goals(Current goals can be found in the care plan section)    OT Frequency:  Min 2X/week   Barriers to D/C:            Co-evaluation              AM-PAC OT "6 Clicks" Daily Activity     Outcome Measure Help from another person eating meals?: A Little Help from another person taking care of personal grooming?: A Little Help from another person toileting, which includes using toliet, bedpan, or urinal?: A Lot Help from another person bathing (including washing, rinsing, drying)?: A Lot Help from another person to put on and taking off regular upper body clothing?: None Help from another person to put on and taking off regular lower body clothing?: A Little 6 Click Score: 17   End of Session Equipment Utilized During Treatment: Gait belt Nurse Communication: Mobility status;Other (comment)  Activity Tolerance: Patient tolerated treatment well;Patient limited by fatigue;Treatment limited secondary to medical complications (Comment) Patient left: in chair;with family/visitor present  OT Visit Diagnosis: Unsteadiness on feet (R26.81);Muscle weakness (generalized) (M62.81)                Time: 7116-5790 OT Time Calculation (min): 30 min Charges:  OT General Charges $OT Visit: 1 Visit OT Evaluation $OT Eval Low Complexity: 1 Low  Sidharth Leverette OTR/L,CLT 02/16/2018, 2:22 PM

## 2018-02-16 NOTE — Discharge Instructions (Signed)
It was so nice to meet you during this hospitalization!  You came into the hospital because you were having a lot of pain. We have been adjusting your pain medications and we seem to have found a good regimen that works.  Please take Oxycodone ER (extended release) every 12 hours. You should then take Oxycodone IR (immediate release) every 3 hours as needed for breakthrough pain.  You were having some issues urinating. We think this is due to a combination of the pain medications and some enlargement of your prostate. We started a medication called Flomax. You should take this every day. Please make sure you follow-up with the urologist in 7 days so that they can remove the urinary catheter.  Take care, Dr. Brett Albino

## 2018-02-18 ENCOUNTER — Other Ambulatory Visit: Payer: Medicare Other

## 2018-02-18 ENCOUNTER — Inpatient Hospital Stay: Payer: Medicare Other | Admitting: Oncology

## 2018-02-18 ENCOUNTER — Ambulatory Visit: Payer: Medicare Other | Admitting: Oncology

## 2018-02-18 ENCOUNTER — Inpatient Hospital Stay: Payer: Medicare Other

## 2018-02-18 ENCOUNTER — Telehealth: Payer: Self-pay | Admitting: Urology

## 2018-02-18 ENCOUNTER — Telehealth: Payer: Self-pay | Admitting: Family Medicine

## 2018-02-18 ENCOUNTER — Ambulatory Visit: Payer: Medicare Other

## 2018-02-18 ENCOUNTER — Telehealth: Payer: Self-pay | Admitting: Oncology

## 2018-02-18 MED ORDER — LACTULOSE 10 GM/15ML PO SOLN: 20.0000 g | Freq: Every day | ORAL | 0 refills | Status: AC | PRN

## 2018-02-18 NOTE — Telephone Encounter (Signed)
Joseph Parker called to report patient has severe constipation. Tried senna and Miralax and not helpful.  Advise patient to use fleet enema. I will also send Lactulose if above not helpful.

## 2018-02-18 NOTE — Telephone Encounter (Signed)
FYI pt's wife want to let Dr Damita Dunnings know pt just got out of hospital

## 2018-02-18 NOTE — Telephone Encounter (Signed)
-----   Message from Billey Co, MD sent at 02/15/2018  4:20 PM EST ----- Regarding: VOID TRIAL Please set up nurse visit for foley removal and void trial in 7-14 days. He can also see me the same day for a 15 minute visit, OK to overbook.  Nickolas Madrid, MD 02/15/2018

## 2018-02-18 NOTE — Telephone Encounter (Signed)
App made 

## 2018-02-19 ENCOUNTER — Emergency Department: Payer: Medicare Other

## 2018-02-19 ENCOUNTER — Inpatient Hospital Stay: Payer: Medicare Other | Admitting: Oncology

## 2018-02-19 ENCOUNTER — Ambulatory Visit: Payer: Medicare Other

## 2018-02-19 ENCOUNTER — Other Ambulatory Visit: Payer: Self-pay

## 2018-02-19 ENCOUNTER — Encounter: Payer: Self-pay | Admitting: Emergency Medicine

## 2018-02-19 ENCOUNTER — Inpatient Hospital Stay: Payer: Medicare Other

## 2018-02-19 ENCOUNTER — Telehealth: Payer: Self-pay | Admitting: *Deleted

## 2018-02-19 ENCOUNTER — Telehealth: Payer: Self-pay

## 2018-02-19 ENCOUNTER — Emergency Department
Admission: EM | Admit: 2018-02-19 | Discharge: 2018-02-19 | Disposition: A | Discharge status: Home / Self Care | Payer: Medicare Other | Attending: Emergency Medicine | Admitting: Emergency Medicine

## 2018-02-19 DIAGNOSIS — I2510 Atherosclerotic heart disease of native coronary artery without angina pectoris: Secondary | ICD-10-CM | POA: Diagnosis not present

## 2018-02-19 DIAGNOSIS — C799 Secondary malignant neoplasm of unspecified site: Secondary | ICD-10-CM

## 2018-02-19 DIAGNOSIS — R0602 Shortness of breath: Secondary | ICD-10-CM | POA: Diagnosis not present

## 2018-02-19 DIAGNOSIS — M899 Disorder of bone, unspecified: Secondary | ICD-10-CM

## 2018-02-19 DIAGNOSIS — Z8546 Personal history of malignant neoplasm of prostate: Secondary | ICD-10-CM | POA: Diagnosis not present

## 2018-02-19 DIAGNOSIS — Z85038 Personal history of other malignant neoplasm of large intestine: Secondary | ICD-10-CM | POA: Insufficient documentation

## 2018-02-19 DIAGNOSIS — Z515 Encounter for palliative care: Secondary | ICD-10-CM | POA: Insufficient documentation

## 2018-02-19 DIAGNOSIS — G893 Neoplasm related pain (acute) (chronic): Secondary | ICD-10-CM

## 2018-02-19 DIAGNOSIS — Z7982 Long term (current) use of aspirin: Secondary | ICD-10-CM | POA: Diagnosis not present

## 2018-02-19 DIAGNOSIS — R52 Pain, unspecified: Secondary | ICD-10-CM | POA: Diagnosis not present

## 2018-02-19 DIAGNOSIS — J449 Chronic obstructive pulmonary disease, unspecified: Secondary | ICD-10-CM | POA: Diagnosis not present

## 2018-02-19 DIAGNOSIS — E860 Dehydration: Secondary | ICD-10-CM | POA: Diagnosis not present

## 2018-02-19 DIAGNOSIS — C7A090 Malignant carcinoid tumor of the bronchus and lung: Secondary | ICD-10-CM | POA: Diagnosis not present

## 2018-02-19 DIAGNOSIS — Z951 Presence of aortocoronary bypass graft: Secondary | ICD-10-CM | POA: Diagnosis not present

## 2018-02-19 DIAGNOSIS — Z79899 Other long term (current) drug therapy: Secondary | ICD-10-CM | POA: Diagnosis not present

## 2018-02-19 DIAGNOSIS — I10 Essential (primary) hypertension: Secondary | ICD-10-CM | POA: Diagnosis not present

## 2018-02-19 DIAGNOSIS — R531 Weakness: Secondary | ICD-10-CM | POA: Diagnosis not present

## 2018-02-19 DIAGNOSIS — Z87891 Personal history of nicotine dependence: Secondary | ICD-10-CM | POA: Insufficient documentation

## 2018-02-19 DIAGNOSIS — R630 Anorexia: Secondary | ICD-10-CM | POA: Diagnosis present

## 2018-02-19 DIAGNOSIS — R4182 Altered mental status, unspecified: Secondary | ICD-10-CM

## 2018-02-19 LAB — HEPATIC FUNCTION PANEL
ALK PHOS: 93 U/L (ref 38–126)
ALT: 17 U/L (ref 0–44)
AST: 24 U/L (ref 15–41)
Albumin: 3.7 g/dL (ref 3.5–5.0)
BILIRUBIN DIRECT: 0.2 mg/dL (ref 0.0–0.2)
Indirect Bilirubin: 0.6 mg/dL (ref 0.3–0.9)
Total Bilirubin: 0.8 mg/dL (ref 0.3–1.2)
Total Protein: 6.7 g/dL (ref 6.5–8.1)

## 2018-02-19 LAB — URINALYSIS, COMPLETE (UACMP) WITH MICROSCOPIC
Bilirubin Urine: NEGATIVE
Glucose, UA: NEGATIVE mg/dL
Ketones, ur: 5 mg/dL — AB
Leukocytes, UA: NEGATIVE
Nitrite: NEGATIVE
Protein, ur: 100 mg/dL — AB
RBC / HPF: >50 RBC/hpf — ABNORMAL HIGH (ref 0–5)
Specific Gravity, Urine: 1.018 (ref 1.005–1.030)
pH: 5 (ref 5.0–8.0)

## 2018-02-19 LAB — CBC
HCT: 28.8 % — ABNORMAL LOW (ref 39.0–52.0)
Hemoglobin: 9.7 g/dL — ABNORMAL LOW (ref 13.0–17.0)
MCH: 29.2 pg (ref 26.0–34.0)
MCHC: 33.7 g/dL (ref 30.0–36.0)
MCV: 86.7 fL (ref 80.0–100.0)
Platelets: 119 10*3/uL — ABNORMAL LOW (ref 150–400)
RBC: 3.32 MIL/uL — ABNORMAL LOW (ref 4.22–5.81)
RDW: 13.4 % (ref 11.5–15.5)
WBC: 8.4 10*3/uL (ref 4.0–10.5)
nRBC: 0.2 % (ref 0.0–0.2)

## 2018-02-19 LAB — BASIC METABOLIC PANEL
Anion gap: 10 (ref 5–15)
BUN: 27 mg/dL — ABNORMAL HIGH (ref 8–23)
CO2: 26 mmol/L (ref 22–32)
Calcium: 8.7 mg/dL — ABNORMAL LOW (ref 8.9–10.3)
Chloride: 99 mmol/L (ref 98–111)
Creatinine, Ser: 0.91 mg/dL (ref 0.61–1.24)
GFR calc Af Amer: >60 mL/min (ref >60)
GLUCOSE: 120 mg/dL — AB (ref 70–99)
Potassium: 3.7 mmol/L (ref 3.5–5.1)
Sodium: 135 mmol/L (ref 135–145)

## 2018-02-19 LAB — LIPASE, BLOOD: Lipase: 23 U/L (ref 11–51)

## 2018-02-19 LAB — TROPONIN I: Troponin I: <0.03 ng/mL (ref <0.03)

## 2018-02-19 MED ORDER — SODIUM CHLORIDE 0.9 % IV BOLUS: 500.0000 mL | Freq: Once | INTRAVENOUS | Status: DC

## 2018-02-19 MED ORDER — SODIUM CHLORIDE 0.9 % IV BOLUS
500.0000 mL | Freq: Once | INTRAVENOUS | Status: AC
  Administered 2018-02-19: 500 mL via INTRAVENOUS

## 2018-02-19 MED ORDER — OXYCODONE HCL 5 MG PO TABS: 5.0000 mg | ORAL_TABLET | ORAL | Status: DC | PRN

## 2018-02-19 MED ORDER — ALBUTEROL SULFATE (2.5 MG/3ML) 0.083% IN NEBU: 2.5000 mg | INHALATION_SOLUTION | Freq: Four times a day (QID) | RESPIRATORY_TRACT | Status: DC | PRN

## 2018-02-19 MED ORDER — MORPHINE SULFATE (PF) 4 MG/ML IV SOLN: 4.0000 mg | INTRAVENOUS | Status: DC | PRN

## 2018-02-19 MED ORDER — PROCHLORPERAZINE MALEATE 10 MG PO TABS
10.0000 mg | ORAL_TABLET | Freq: Four times a day (QID) | ORAL | Status: DC | PRN
  Filled 2018-02-19: qty 1

## 2018-02-19 MED ORDER — LIDOCAINE VISCOUS HCL 2 % MT SOLN
15.0000 mL | Freq: Once | OROMUCOSAL | Status: AC
  Administered 2018-02-19: 15 mL via OROMUCOSAL
  Filled 2018-02-19: qty 15

## 2018-02-19 MED ORDER — LIDOCAINE VISCOUS HCL 2 % MT SOLN: 15.0000 mL | OROMUCOSAL | 0 refills | Status: AC | PRN

## 2018-02-19 MED ORDER — ALBUTEROL SULFATE HFA 108 (90 BASE) MCG/ACT IN AERS: 2.0000 | INHALATION_SPRAY | Freq: Four times a day (QID) | RESPIRATORY_TRACT | Status: DC | PRN

## 2018-02-19 MED ORDER — GABAPENTIN 100 MG PO CAPS
100.0000 mg | ORAL_CAPSULE | Freq: Three times a day (TID) | ORAL | Status: DC
  Administered 2018-02-19: 100 mg via ORAL
  Filled 2018-02-19 (×2): qty 1

## 2018-02-19 NOTE — ED Triage Notes (Signed)
Pt to ED via EMS from home c/o decreased appetite for several days, concerned for dehydration, decreased urine output and darker color.  Per wife patient had not had BM for several days and used laxatives at home and had 4 BM last night, pt still c/o of constipation feeling.  Hx of stage 4 lung cancer.

## 2018-02-19 NOTE — Telephone Encounter (Signed)
LM requesting call back to complete TCM and schedule hospital follow up.   

## 2018-02-19 NOTE — ED Notes (Signed)
Pt pale, alert. C/o pain in face, mouth. Pt given viscous lidocaine and food tray. Pt eating at this time

## 2018-02-19 NOTE — Consult Note (Signed)
Hematology/Oncology Consult note [facility] [phone] [phone]  Patient Care Team: Tonia Ghent, MD as PCP - General (Family Medicine) Eustace Moore, MD as Consulting Physician (Neurosurgery) Telford Nab, RN as Registered Nurse   Name of the patient: Joseph Parker  [id number]  09/07/1940   Date of visit: 02/19/18 REASON FOR COSULTATION:  Metastatic neuroendocrine tumor History of presenting illness-  77 y.o. male with history of widely metastatic neuroendocrine tumor status post 1 cycle of carboplatin/etoposide/Tecentriq treatments, presented to emergency room due to profound weakness, poor oral intake and decreased mentation. Patient was recently admitted from 02/08/2018 to 02/10/2018 and again readmitted from 02/11/2018 to 02/16/2018 for intractable neck pain secondary to enlargement of skull base lesion. Currently on palliative radiation. Patient called in yesterday and reported of your constipation despite taking Senna and MiraLAX.  He was advised to try enema which he did without any relief.  He was also advised to try lactulose which gave him 4 bowel movements overnight. Wife reports feeling overwhelmed with patient's need at home and ask about hospice.  Heme-onc was consulted for addressing goals of care. Patient currently in the emergency room, getting IV fluid hydration.  Reports poor appetite for the past few days.  Constipation was relieved after taking lactulose.  Status post several watery bowel movement.  Still feel lower abdomen pain. Had Foley catheter placed for BPH induced urinary tract obstruction.  Patient expresses his wishes of not to receive any more chemotherapy or radiation treatments.  Wife and son at bedside.  Neck pain has been relatively better controlled with the combination of radiation and oral pain regimen.  Review of Systems  Constitutional: Positive for appetite change and fatigue. Negative for  chills and fever.  HENT:   Negative for hearing loss and voice change.   Eyes: Negative for eye problems.  Respiratory: Negative for chest tightness and cough.   Cardiovascular: Negative for chest pain.  Gastrointestinal: Positive for constipation and diarrhea. Negative for abdominal distention, abdominal pain and blood in stool.  Endocrine: Negative for hot flashes.  Genitourinary: Negative for difficulty urinating and frequency.   Musculoskeletal: Negative for arthralgias.  Skin: Negative for itching and rash.  Neurological: Negative for extremity weakness.  Hematological: Negative for adenopathy.  Psychiatric/Behavioral: Negative for confusion.    Allergies  Allergen Reactions  . Penicillins Itching, Swelling and Rash    DID THE REACTION INVOLVE: Swelling of the face/tongue/throat, SOB, or low BP? Yes Sudden or severe rash/hives, skin peeling, or the inside of the mouth or nose? Unknown Did it require medical treatment? Yes When did it last happen?30+ years If all above answers are "NO", may proceed with cephalosporin use.   Lavella Lemons [Benzonatate] Swelling    Had wide-spread numbness, throat swelling    Patient Active Problem List   Diagnosis Date Noted  . Metastatic cancer ([place])   . AKI (acute kidney injury) ([place])   . Benign prostatic hyperplasia with urinary obstruction   . Thrombocytopenia ([place])   . Leukopenia   . Palliative care encounter   . Intractable pain 02/11/2018  . Neck pain 02/08/2018  . Hypercalcemia 02/05/2018  . Early satiety 02/05/2018  . Neoplasm related pain 02/05/2018  . Thrush 02/05/2018  . 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 Goals of care, counseling/discussion 01/19/2018  . Bone lesion 01/01/2018  . Anorexia 12/20/2017  . 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 Sciatica 11/27/2017  . Carotid stenosis 04/22/2017  . Chronic obstructive pulmonary  disease ([place]) 09/03/2016  . COPD exacerbation ([place])  01/09/2016  . Colon cancer screening 11/11/2015  . Right hand pain 11/11/2015  . Cough 03/03/2015  . Weight loss 01/06/2015  . Dysuria 10/29/2014  . S/P lumbar microdiscectomy 10/14/2014  . Advance care planning 05/27/2014  . Iliotibial band syndrome 04/07/2014  . CAD (coronary artery disease), native coronary artery 06/11/2013  . Decreased exercise tolerance 06/10/2013  . Nodule of right lung 10/25/2011  . Emphysema lung ([place]) 07/20/2011  . Medicare annual wellness visit, initial 07/20/2011     Past Medical History:  Diagnosis Date  . Cancer ([place])   . Carotid arterial disease ([place])   . Carotid artery disease ([place])   . Chickenpox   . Colon polyps   . COPD (chronic obstructive pulmonary disease) ([place])   . Hyperlipidemia   . Neuroendocrine cancer ([place]) 01/28/2018  . Normal cardiac stress test    06/2013  . Shortness of breath dyspnea   . Urinary frequency   . Urinary hesitancy      Past Surgical History:  Procedure Laterality Date  . CATARACT EXTRACTION, BILATERAL    . COLONOSCOPY    . ESOPHAGOGASTRODUODENOSCOPY    . HERNIA REPAIR     right inquinal - as child  . LUMBAR LAMINECTOMY/DECOMPRESSION MICRODISCECTOMY Left 10/14/2014   Procedure: Left Lumbar three-four extraforaminla microdiskectomy;  Surgeon: Eustace Moore, MD;  Location: [facility];  Service: Neurosurgery;  Laterality: Left;  Left L34 extraforaminal microdiskectomy  . POLYPECTOMY    . PORTA CATH INSERTION N/A 01/31/2018   Procedure: PORTA CATH INSERTION;  Surgeon: Algernon Huxley, MD;  Location: [facility];  Service: Cardiovascular;  Laterality: N/A;  . TONSILLECTOMY     remote - childhood  . UPPER GASTROINTESTINAL ENDOSCOPY      Social History   Socioeconomic History  . Marital status: Married    Spouse name: Not on file  . Number of children: 5  . Years of education: 76  . Highest education level: Not on file  Occupational History  . Occupation: mfg Librarian, academic    Comment: retired    Scientific laboratory technician  . Financial resource strain: Patient refused  . Food insecurity:    Worry: Patient refused    Inability: Patient refused  . Transportation needs:    Medical: Patient refused    Non-medical: Patient refused  Tobacco Use  . Smoking status: Former Smoker    Packs/day: 0.50    Years: 40.00    Pack years: 20.00    Last attempt to quit: 07/20/1999    Years since quitting: 18.6  . Smokeless tobacco: Never Used  Substance and Sexual Activity  . Alcohol use: No    Alcohol/week: 0.0 standard drinks  . Drug use: No  . Sexual activity: Yes    Birth control/protection: Post-menopausal  Lifestyle  . Physical activity:    Days per week: Patient refused    Minutes per session: Patient refused  . Stress: Patient refused  Relationships  . Social connections:    Talks on phone: Patient refused    Gets together: Patient refused    Attends religious service: Patient refused    Active member of club or organization: Patient refused    Attends meetings of clubs or organizations: Patient refused    Relationship status: Patient refused  . Intimate partner violence:    Fear of current or ex partner: Patient refused    Emotionally abused: Patient refused    Physically abused: Patient refused  Forced sexual activity: Patient refused  Other Topics Concern  . Not on file  Social History Narrative   HSG. Married '63.    Previous marriage - 3 years/divorce. 5 sons. 7 grandchildren.   Work - Financial planner - retired.    Active in community and in his church   [employer] fan     Family History  Problem Relation Age of Onset  . Heart disease Mother        CABG; Valve replacement - died post-op  . Alzheimer's disease Father   . Diabetes Father   . Diabetes Son   . Colon cancer Sister   . Prostate cancer Neg Hx   . Colon polyps Neg Hx      Current Facility-Administered Medications:  .  albuterol (PROVENTIL) (2.5 MG/3ML) 0.083% nebulizer solution 2.5 mg, 2.5 mg, Nebulization,  Q6H PRN, Merlyn Lot, MD .  gabapentin (NEURONTIN) capsule 100 mg, 100 mg, Oral, TID, Merlyn Lot, MD .  morphine 4 MG/ML injection 4 mg, 4 mg, Intravenous, Q3H PRN, Merlyn Lot, MD .  oxyCODONE (Oxy IR/ROXICODONE) immediate release tablet 5-10 mg, 5-10 mg, Oral, Q3H PRN, Merlyn Lot, MD .  prochlorperazine (COMPAZINE) tablet 10 mg, 10 mg, Oral, Q6H PRN, Merlyn Lot, MD  Current Outpatient Medications:  .  albuterol (PROVENTIL HFA;VENTOLIN HFA) 108 (90 Base) MCG/ACT inhaler, Inhale 2 puffs into the lungs every 6 (six) hours as needed for wheezing or shortness of breath., Disp: 1 Inhaler, Rfl: 12 .  amitriptyline (ELAVIL) 50 MG tablet, Take 1 tablet (50 mg total) by mouth at bedtime., Disp: 30 tablet, Rfl: 0 .  aspirin EC 81 MG tablet, Take 1 tablet (81 mg total) by mouth daily., Disp: , Rfl:  .  dexamethasone (DECADRON) 4 MG tablet, Take 1 tablet (4 mg total) by mouth daily., Disp: 10 tablet, Rfl: 0 .  Fluticasone-Salmeterol (ADVAIR DISKUS) 250-50 MCG/DOSE AEPB, Inhale 1 puff into the lungs 2 (two) times daily., Disp: 60 each, Rfl: prn .  gabapentin (NEURONTIN) 100 MG capsule, Take 1 capsule (100 mg total) by mouth 3 (three) times daily., Disp: 90 capsule, Rfl: 0 .  lactulose (CHRONULAC) 10 GM/15ML solution, Take 30 mLs (20 g total) by mouth daily as needed for mild constipation., Disp: 236 mL, Rfl: 0 .  lidocaine (XYLOCAINE) 2 % solution, Use as directed 15 mLs in the mouth or throat every 4 (four) hours as needed for mouth pain., Disp: 200 mL, Rfl: 0 .  lidocaine-prilocaine (EMLA) cream, Apply to affected area once, Disp: 30 g, Rfl: 3 .  meloxicam (MOBIC) 15 MG tablet, Take 1 tablet (15 mg total) by mouth daily., Disp: 30 tablet, Rfl: 0 .  ondansetron (ZOFRAN) 8 MG tablet, Take 1 tablet (8 mg total) by mouth 2 (two) times daily as needed for refractory nausea / vomiting. Start on day 3 after carboplatin chemo., Disp: 30 tablet, Rfl: 1 .  oxyCODONE (OXY IR/ROXICODONE)  5 MG immediate release tablet, Take 1-2 tablets (5-10 mg total) by mouth every 3 (three) hours as needed for moderate pain or breakthrough pain., Disp: 30 tablet, Rfl: 0 .  oxyCODONE ER (XTAMPZA ER) 18 MG C12A, Take 18 mg by mouth every 12 (twelve) hours., Disp: 14 each, Rfl: 0 .  polyethylene glycol powder (GLYCOLAX/MIRALAX) powder, Take 17 g by mouth 2 (two) times daily as needed., Disp: , Rfl:  .  prochlorperazine (COMPAZINE) 10 MG tablet, Take 1 tablet (10 mg total) by mouth every 6 (six) hours as needed (Nausea or vomiting)., Disp:  30 tablet, Rfl: 1 .  senna (SENOKOT) 8.6 MG TABS tablet, Take 1 tablet (8.6 mg total) by mouth 2 (two) times daily., Disp: 120 each, Rfl: 0 .  tamsulosin (FLOMAX) 0.4 MG CAPS capsule, Take 1 capsule (0.4 mg total) by mouth daily., Disp: 30 capsule, Rfl: 0  Facility-Administered Medications Ordered in Other Encounters:  .  HYDROmorphone (DILAUDID) injection 1 mg, 1 mg, Intravenous, Once, Borders, Kirt Boys, NP   Physical exam:  Vitals:   02/19/18 1116 02/19/18 1117 02/19/18 1330 02/19/18 1400  BP: (!) 146/69  129/67 (!) 146/71  Pulse: 95  87 82  Resp: 20     Temp: 98 F (36.7 C)     TempSrc: Oral     SpO2: 99%  100% 98%  Weight:  125 lb (56.7 kg)    Height:  5\' 8"  (1.727 m)     Physical Exam  Constitutional: He is oriented to person, place, and time. No distress.  Cachexia  HENT:  Head: Normocephalic and atraumatic.  Mouth/Throat: No oropharyngeal exudate.  Oral thrush has improved  Eyes: Pupils are equal, round, and reactive to light. EOM are normal. No scleral icterus.  Neck: Normal range of motion. Neck supple.  Cardiovascular: Normal rate and regular rhythm.  No murmur heard. Pulmonary/Chest: Effort normal. No respiratory distress. He has no rales.  Abdominal: Soft.  Musculoskeletal: Normal range of motion.        General: No edema.  Neurological: He is alert and oriented to person, place, and time.  Skin: Skin is warm and dry. No erythema.    Psychiatric: Affect normal.        CMP Latest Ref Rng & Units 02/19/2018  Glucose 70 - 99 mg/dL 120(H)  BUN 8 - 23 mg/dL 27(H)  Creatinine 0.61 - 1.24 mg/dL 0.91  Sodium 135 - 145 mmol/L 135  Potassium 3.5 - 5.1 mmol/L 3.7  Chloride 98 - 111 mmol/L 99  CO2 22 - 32 mmol/L 26  Calcium 8.9 - 10.3 mg/dL 8.7(L)  Total Protein 6.5 - 8.1 g/dL 6.7  Total Bilirubin 0.3 - 1.2 mg/dL 0.8  Alkaline Phos 38 - 126 U/L 93  AST 15 - 41 U/L 24  ALT 0 - 44 U/L 17   CBC Latest Ref Rng & Units 02/19/2018  WBC 4.0 - 10.5 K/uL 8.4  Hemoglobin 13.0 - 17.0 g/dL 9.7(L)  Hematocrit 39.0 - 52.0 % 28.8(L)  Platelets 150 - 400 K/uL 119(L)    @IMAGES @  Dg Chest 2 View  Result Date: 02/19/2018 CLINICAL DATA:  Shortness of breath. EXAM: CHEST - 2 VIEW COMPARISON:  CT 01/20/2018.  Chest x-ray 01/20/2018. FINDINGS: PowerPort catheter with tip over superior vena cava. Heart size normal. Lungs are clear. No pleural effusion or pneumothorax. Destructive lesion again noted along the left T12 vertebral body, reference is made to prior CT report 01/20/2018. IMPRESSION: 1.  PowerPort catheter with tip over superior vena cava. 2.  No acute pulmonary disease. 3. Destructive lesion again noted the left T12 vertebral body, reference is made to prior CT report 01/20/2018. Electronically Signed   By: Marcello Moores  Register   On: 02/19/2018 11:59   Ct Head Wo Contrast  Result Date: 02/08/2018 CLINICAL DATA:  Acute right neck pain and headache beginning after radiation therapy yesterday. History of poorly differentiated neuroendocrine cancer. EXAM: CT HEAD WITHOUT CONTRAST CT CERVICAL SPINE WITHOUT CONTRAST TECHNIQUE: Multidetector CT imaging of the head and cervical spine was performed following the standard protocol without intravenous contrast. Multiplanar CT image  reconstructions of the cervical spine were also generated. COMPARISON:  Brain MRI 01/09/2018.  PET-CT 01/11/2018. FINDINGS: CT HEAD FINDINGS Brain: There is no  evidence of acute infarct, intracranial hemorrhage, mass, midline shift, or extra-axial fluid collection. The ventricles and sulci are within normal limits for age. Scattered cerebral white matter hypodensities are nonspecific but compatible with mild chronic small vessel ischemic disease. Vascular: Calcified atherosclerosis at the skull base. No hyperdense vessel. Skull: 3.5 cm destructive skull base lesion involving the right occipital condyle, petrous ridge, and clivus, increased in size from the prior MRI. Scattered subcentimeter lesions elsewhere in the skull. Sinuses/Orbits: Visualized paranasal sinuses and mastoid air cells are clear. Bilateral cataract extraction is noted. Other: None. CT CERVICAL SPINE FINDINGS Alignment: Slight right convex curvature of the cervical spine. No significant listhesis. Skull base and vertebrae: Right-sided skull base lesion as above. 4 cm destructive mass involving the left-sided posterior elements at T2. 1 cm lytic lesion in the C7 vertebral body. No acute fracture Soft tissues and spinal canal: Left-sided epidural tumor at T2 without evidence of high-grade spinal stenosis. No prevertebral swelling. Disc levels: Moderate disc space narrowing and degenerative endplate spurring from U1-3 to C7-T1 with moderate to severe multilevel neural foraminal stenosis. Upper chest: Centrilobular emphysema in the lung apices. Other: Carotid artery atherosclerosis. IMPRESSION: 1. No evidence of acute intracranial abnormality. 2. Osseous metastatic disease with significant interval enlargement of a destructive right skull base mass. 3. No acute osseous abnormality identified in the cervical spine. 4. 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 5. Metastasis involving the T2 posterior elements with left lateral epidural tumor. Electronically Signed   By: Logan Bores M.D.   On: 02/08/2018 13:09   Ct Angio Chest Pe W And/or Wo Contrast  Result  Date: 01/20/2018 CLINICAL DATA:  Shortness of breath and left chest pain today. History of lung cancer. EXAM: CT ANGIOGRAPHY CHEST WITH CONTRAST TECHNIQUE: Multidetector CT imaging of the chest was performed using the standard protocol during bolus administration of intravenous contrast. Multiplanar CT image reconstructions and MIPs were obtained to evaluate the vascular anatomy. CONTRAST:  75 mL OMNIPAQUE IOHEXOL 350 MG/ML SOLN COMPARISON:  PET CT scan 01/11/2018. FINDINGS: Cardiovascular: Satisfactory opacification of the pulmonary arteries to the segmental level. No evidence of pulmonary embolism. Normal heart size. No pericardial effusion. Mediastinum/Nodes: Again seen is a mass lesion centered in the mediastinum on the left which measures 5.4 x 3.9 cm on image 43, unchanged. As noted on the prior examination, the lesion is centered in the AP window. It abuts the right main pulmonary artery without invading it. The mass also surrounds the interlobar branch to the anterior left upper lobe. No right hilar lymphadenopathy is identified. The esophagus and thyroid gland appear normal. Lungs/Pleura: The patient has a small left pleural effusion which is new since the prior exam. The lungs demonstrate extensive centrilobular emphysema. A left upper lobe nodule on image 42 measuring 0.7 cm is unchanged. There also a few scattered ground-glass attenuation opacities in the anterior left upper lobe which are unchanged. The right lung is clear. Upper Abdomen: No acute abnormality. Cyst in the right kidney noted. Musculoskeletal: Destructive lesion in the left T2 transverse process is identified and measures approximately 3.5 x 2.0 cm on image 11 of series 7, not notably changed. Also seen is a large destructive lesion in the left side of the T12 vertebral body extending into the left pedicle, unchanged. No new bony abnormality is identified. Review of the  MIP images confirms the above findings. IMPRESSION: Negative for  pulmonary embolus or acute cardiopulmonary disease. Small left pleural effusion is new since the prior exam. No change in a left mediastinal mass lesion with metastatic deposits in the left side of T12 and left transverse process of T2. Aortic Atherosclerosis (ICD10-I70.0) and Severe emphysema (ICD10-J43.9). Electronically Signed   By: Inge Rise M.D.   On: 01/20/2018 16:40   Ct Cervical Spine Wo Contrast  Result Date: 02/08/2018 CLINICAL DATA:  Acute right neck pain and headache beginning after radiation therapy yesterday. History of poorly differentiated neuroendocrine cancer. EXAM: CT HEAD WITHOUT CONTRAST CT CERVICAL SPINE WITHOUT CONTRAST TECHNIQUE: Multidetector CT imaging of the head and cervical spine was performed following the standard protocol without intravenous contrast. Multiplanar CT image reconstructions of the cervical spine were also generated. COMPARISON:  Brain MRI 01/09/2018.  PET-CT 01/11/2018. FINDINGS: CT HEAD FINDINGS Brain: There is no evidence of acute infarct, intracranial hemorrhage, mass, midline shift, or extra-axial fluid collection. The ventricles and sulci are within normal limits for age. Scattered cerebral white matter hypodensities are nonspecific but compatible with mild chronic small vessel ischemic disease. Vascular: Calcified atherosclerosis at the skull base. No hyperdense vessel. Skull: 3.5 cm destructive skull base lesion involving the right occipital condyle, petrous ridge, and clivus, increased in size from the prior MRI. Scattered subcentimeter lesions elsewhere in the skull. Sinuses/Orbits: Visualized paranasal sinuses and mastoid air cells are clear. Bilateral cataract extraction is noted. Other: None. CT CERVICAL SPINE FINDINGS Alignment: Slight right convex curvature of the cervical spine. No significant listhesis. Skull base and vertebrae: Right-sided skull base lesion as above. 4 cm destructive mass involving the left-sided posterior elements at T2. 1  cm lytic lesion in the C7 vertebral body. No acute fracture Soft tissues and spinal canal: Left-sided epidural tumor at T2 without evidence of high-grade spinal stenosis. No prevertebral swelling. Disc levels: Moderate disc space narrowing and degenerative endplate spurring from B7-6 to C7-T1 with moderate to severe multilevel neural foraminal stenosis. Upper chest: Centrilobular emphysema in the lung apices. Other: Carotid artery atherosclerosis. IMPRESSION: 1. No evidence of acute intracranial abnormality. 2. Osseous metastatic disease with significant interval enlargement of a destructive right skull base mass. 3. No acute osseous abnormality identified in the cervical spine. 4. 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 5. Metastasis involving the T2 posterior elements with left lateral epidural tumor. Electronically Signed   By: Logan Bores M.D.   On: 02/08/2018 13:09   US Renal  Result Date: 02/15/2018 CLINICAL DATA:  Urinary incontinence EXAM: RENAL / URINARY TRACT ULTRASOUND COMPLETE COMPARISON:  CT abdomen pelvis of 12/28/2017 FINDINGS: Right Kidney: Renal measurements: 12.6 x 5.3 x 5.6 cm. = volume: 194 mL. There is moderate right hydronephrosis present, new since the CT of 12/28/2016. Renal cysts are present primarily in the upper pole of 2.2 and 1.5 cm in diameter respectively. No solid renal lesion is seen. The echogenicity of the renal parenchyma may be slightly increased and correlation with renal laboratory values is recommended. Left Kidney: Renal measurements: 11.6 x 6.3 x 4.9 cm. = volume: One hundred ninety-one mL. Moderate hydronephrosis also noted involving the left kidney. Renal cysts are present of 2.8 and 2.5 cm in diameter respectively. The echogenicity of the left kidney may be slightly increased. Bladder: The urinary bladder is well distended. There does appear to be some prominence of the prostate gland. However the soft tissue noted in  the posterior inferior aspect of the urinary  bladder could be due to either indentation by prominent median lobe of the prostate versus a separate bladder carcinoma. I did review the recent CT and it is difficult to determine which may be the cause. This area within the posterior inferior aspect of the urinary bladder measures 4.5 x 2.2 x 2.4 cm. Prevoid urine volume of 924 cubic cm is measured. The patient could not void however for postvoid volume to be obtained. IMPRESSION: 1. Moderate bilateral hydronephrosis, new since the CT of 12/28/2017. 2. Soft tissue structure in the posterior inferior aspect of the urinary bladder which could represent either a primary urinary bladder carcinoma or possibly indentation by a prominent median lobe of the prostate. It is difficult to distinguish between the two entities on this ultrasound or prior CT of the abdomen pelvis. 3. Questionable slightly echogenic renal parenchyma bilaterally. 4. Bilateral renal cysts.  No solid renal lesion is seen. Electronically Signed   By: Ivar Drape M.D.   On: 02/15/2018 10:25   Ct Biopsy  Result Date: 01/22/2018 INDICATION: Lytic bone lesions and lung mass EXAM: CT BIOPSY MEDICATIONS: None. ANESTHESIA/SEDATION: Fentanyl 100 mcg IV; Versed 1 mg IV Moderate Sedation Time:  6 minutes The patient was continuously monitored during the procedure by the interventional radiology nurse under my direct supervision. FLUOROSCOPY TIME:  Fluoroscopy Time:  minutes  seconds ( mGy). COMPLICATIONS: None immediate. PROCEDURE: Informed written consent was obtained from the patient after a thorough discussion of the procedural risks, benefits and alternatives. All questions were addressed. Maximal Sterile Barrier Technique was utilized including caps, mask, sterile gowns, sterile gloves, sterile drape, hand hygiene and skin antiseptic. A timeout was performed prior to the initiation of the procedure. Under CT guidance, a(n) 17 gauge guide needle was  advanced into the right iliac bone lesion. Subsequently, 3 18 gauge core biopsies were obtained. The guide needle was removed. Post biopsy images demonstrate no hemorrhage. Patient tolerated the procedure well without complication. Vital sign monitoring by nursing staff during the procedure will continue as patient is in the special procedures unit for post procedure observation. FINDINGS: The images document guide needle placement within the right iliac bone lesion. Post biopsy images demonstrate no hemorrhage. IMPRESSION: Successful CT-guided core biopsy of a right iliac bone lesion. Electronically Signed   By: Marybelle Killings M.D.   On: 01/22/2018 12:48    Assessment and plan- Patient is a 77 y.o. male with widely metastatic neuroendocrine carcinoma presented emergency room due to profound weakness, lack of p.o. intake, constipation and laxative induced diarrhea.  #Goals of care I had a lengthy discussion with patient's, his wife and son at bedside.  Patient expresses his wishes of not receiving any more chemotherapy or radiation treatments.  Patient and family desire focusing on comfort care/hospice.  Will respect patient and family wishes. Referred to hospice.  Discussed with home hospice nurse Santiago Glad as well as ED physician.  I will start as patient is hospice attending and continue care outpatient.   Thank you for allowing me to participate in the care of this patient.  Total face to face encounter time for this patient visit was 50  min. >50% of the time was  spent in counseling and coordination of care.    Earlie Server, MD, PhD Hematology Oncology [facility] Pager- [phone] 02/19/2018

## 2018-02-19 NOTE — Care Management Note (Signed)
Case Management Note  Patient Details  Name: Joseph Parker MRN: [id number] Date of Birth: 06/30/1940  Subjective/Objective:  Patient is being seen in the ED for constipation.  Patient was recently discharged from the hospital with home health with [facility] and outpatient palliative through the cancer center.  [facility] was supposed to come out today but the patient is being seen in the ED so the appointment was canceled.  Wife is experiencing caregiver strain and reports that she just needs help caring for him.  Lindi Adie with Palliative in to see patient, and Dr. Tasia Catchings the patient's oncology doctor in to see patient as well.  The patient's wife is very interested in Hospice at home.  Choice of Medicare approved agencies offered to wife and she chooses [facility].  Flo Shanks with [facility] called and will come to see patient to help assist with setting up hospice services.  Wife reports that they don't need any equipment, the patient sleeps in his bed and ambulates with minimal assistance, he just needs supervision for ambulation.    Doran Clay RN BSN [phone]                   Action/Plan:   Expected Discharge Date:                  Expected Discharge Plan:     In-House Referral:     Discharge planning Services     Post Acute Care Choice:    Choice offered to:     DME Arranged:    DME Agency:     HH Arranged:    HH Agency:     Status of Service:     If discussed at [place] of Stay Meetings, dates discussed:    Additional Comments:  Shelbie Hutching, RN 02/19/2018, 3:37 PM

## 2018-02-19 NOTE — ED Provider Notes (Signed)
[facility] Emergency Department Provider Note    First MD Initiated Contact with Patient 02/19/18 1317     (approximate)  I have reviewed the triage vital signs and the nursing notes.   HISTORY  Chief Complaint Anorexia and Dehydration    HPI Joseph Parker is a 77 y.o. male below listed past medical history of metastatic cancer presents the ER with several days of absent appetite generalized weakness inability to eat or swallow due to pain in his mouth as well as poor pain control.  Denies any fevers.  No nausea vomiting.  Is concerned that he is dehydrated but daughter is with him at bedside states that they would like to discuss starting hospice she is having difficulty caring for him at home.    Past Medical History:  Diagnosis Date  . Cancer ([place])   . Carotid arterial disease ([place])   . Carotid artery disease ([place])   . Chickenpox   . Colon polyps   . COPD (chronic obstructive pulmonary disease) ([place])   . Hyperlipidemia   . 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 Normal cardiac stress test    06/2013  . Shortness of breath dyspnea   . Urinary frequency   . Urinary hesitancy    Family History  Problem Relation Age of Onset  . Heart disease Mother        CABG; Valve replacement - died post-op  . Alzheimer's disease Father   . Diabetes Father   . Diabetes Son   . Colon cancer Sister   . Prostate cancer Neg Hx   . Colon polyps Neg Hx    Past Surgical History:  Procedure Laterality Date  . CATARACT EXTRACTION, BILATERAL    . COLONOSCOPY    . ESOPHAGOGASTRODUODENOSCOPY    . HERNIA REPAIR     right inquinal - as child  . LUMBAR LAMINECTOMY/DECOMPRESSION MICRODISCECTOMY Left 10/14/2014   Procedure: Left Lumbar three-four extraforaminla microdiskectomy;  Surgeon: Eustace Moore, MD;  Location: [facility]UR[facility] ORS;  Service: Neurosurgery;  Laterality: Left;  Left L34 extraforaminal microdiskectomy  . POLYPECTOMY    . PORTA CATH INSERTION N/A  01/31/2018   Procedure: PORTA CATH INSERTION;  Surgeon: Algernon Huxley, MD;  Location: [facility] CV LAB;  Service: Cardiovascular;  Laterality: N/A;  . TONSILLECTOMY     remote - childhood  . UPPER GASTROINTESTINAL ENDOSCOPY     Patient Active Problem List   Diagnosis Date Noted  . AKI (acute kidney injury) ([place])   . Benign prostatic hyperplasia with urinary obstruction   . Thrombocytopenia ([place])   . Leukopenia   . Palliative care encounter   . Intractable pain 02/11/2018  . Neck pain 02/08/2018  . Hypercalcemia 02/05/2018  . Early satiety 02/05/2018  . Neoplasm related pain 02/05/2018  . Thrush 02/05/2018  . 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 Goals of care, counseling/discussion 01/19/2018  . Bone lesion 01/01/2018  . Anorexia 12/20/2017  . 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 Sciatica 11/27/2017  . Carotid stenosis 04/22/2017  . Chronic obstructive pulmonary disease ([place]) 09/03/2016  . COPD exacerbation ([place]) 01/09/2016  . Colon cancer screening 11/11/2015  . Right hand pain 11/11/2015  . Cough 03/03/2015  . Weight loss 01/06/2015  . Dysuria 10/29/2014  . S/P lumbar microdiscectomy 10/14/2014  . Advance care planning 05/27/2014  . Iliotibial band syndrome 04/07/2014  . CAD (coronary artery disease), native coronary artery 06/11/2013  . Decreased exercise tolerance 06/10/2013  . Nodule  of right lung 10/25/2011  . Emphysema lung ([place]) 07/20/2011  . Medicare annual wellness visit, initial 07/20/2011      Prior to Admission medications   Medication Sig Start Date End Date Taking? Authorizing Provider  albuterol (PROVENTIL HFA;VENTOLIN HFA) 108 (90 Base) MCG/ACT inhaler Inhale 2 puffs into the lungs every 6 (six) hours as needed for wheezing or shortness of breath. 04/19/17   Tonia Ghent, MD  amitriptyline (ELAVIL) 50 MG tablet Take 1 tablet (50 mg total) by mouth at bedtime. 02/10/18   Salary, Holly Bodily D, MD  aspirin EC 81 MG  tablet Take 1 tablet (81 mg total) by mouth daily. 06/11/13   Belva Crome, MD  dexamethasone (DECADRON) 4 MG tablet Take 1 tablet (4 mg total) by mouth daily. 02/10/18   Salary, Avel Peace, MD  Fluticasone-Salmeterol (ADVAIR DISKUS) 250-50 MCG/DOSE AEPB Inhale 1 puff into the lungs 2 (two) times daily. 04/19/17   Tonia Ghent, MD  gabapentin (NEURONTIN) 100 MG capsule Take 1 capsule (100 mg total) by mouth 3 (three) times daily. 02/16/18   Mayo, Pete Pelt, MD  lactulose (CHRONULAC) 10 GM/15ML solution Take 30 mLs (20 g total) by mouth daily as needed for mild constipation. 02/18/18   Earlie Server, MD  lidocaine (XYLOCAINE) 2 % solution Use as directed 15 mLs in the mouth or throat every 4 (four) hours as needed for mouth pain. 02/19/18   Merlyn Lot, MD  lidocaine-prilocaine (EMLA) cream Apply to affected area once 01/28/18   Earlie Server, MD  meloxicam (MOBIC) 15 MG tablet Take 1 tablet (15 mg total) by mouth daily. 02/10/18 02/10/19  Salary, Avel Peace, MD  ondansetron (ZOFRAN) 8 MG tablet Take 1 tablet (8 mg total) by mouth 2 (two) times daily as needed for refractory nausea / vomiting. Start on day 3 after carboplatin chemo. 01/28/18   Earlie Server, MD  oxyCODONE (OXY IR/ROXICODONE) 5 MG immediate release tablet Take 1-2 tablets (5-10 mg total) by mouth every 3 (three) hours as needed for moderate pain or breakthrough pain. 02/16/18   Mayo, Pete Pelt, MD  oxyCODONE ER [facility] ER) 18 MG C12A Take 18 mg by mouth every 12 (twelve) hours. 02/16/18   Mayo, Pete Pelt, MD  polyethylene glycol powder (GLYCOLAX/MIRALAX) powder Take 17 g by mouth 2 (two) times daily as needed. 01/04/18   Tonia Ghent, MD  prochlorperazine (COMPAZINE) 10 MG tablet Take 1 tablet (10 mg total) by mouth every 6 (six) hours as needed (Nausea or vomiting). 01/28/18   Earlie Server, MD  senna (SENOKOT) 8.6 MG TABS tablet Take 1 tablet (8.6 mg total) by mouth 2 (two) times daily. 02/16/18   Mayo, Pete Pelt, MD  tamsulosin (FLOMAX) 0.4 MG  CAPS capsule Take 1 capsule (0.4 mg total) by mouth daily. 02/16/18   Mayo, Pete Pelt, MD    Allergies Penicillins and Tessalon [benzonatate]    Social History Social History   Tobacco Use  . Smoking status: Former Smoker    Packs/day: 0.50    Years: 40.00    Pack years: 20.00    Last attempt to quit: 07/20/1999    Years since quitting: 18.6  . Smokeless tobacco: Never Used  Substance Use Topics  . Alcohol use: No    Alcohol/week: 0.0 standard drinks  . Drug use: No    Review of Systems Patient denies headaches, rhinorrhea, blurry vision, numbness, shortness of breath, chest pain, edema, cough, abdominal pain, nausea, vomiting, diarrhea, dysuria, fevers, rashes or hallucinations unless otherwise  stated above in HPI. ____________________________________________   PHYSICAL EXAM:  VITAL SIGNS: Vitals:   02/19/18 1330 02/19/18 1400  BP: 129/67 (!) 146/71  Pulse: 87 82  Resp:    Temp:    SpO2: 100% 98%    Constitutional: Alert and oriented. Frail and cachectic appearing Eyes: Conjunctivae are normal.  Head: Atraumatic. Nose: No congestion/rhinnorhea. Mouth/Throat: Mucous membranes are dry with evidence of mucositis Neck: No stridor. Painless ROM.  Cardiovascular: Normal rate, regular rhythm. Grossly normal heart sounds.  Good peripheral circulation. Respiratory: Normal respiratory effort.  No retractions. Lungs CTAB. Gastrointestinal: Soft and nontender. No distention. No abdominal bruits. No CVA tenderness. Genitourinary:  Musculoskeletal: No lower extremity tenderness nor edema.  No joint effusions. Neurologic:  Normal speech and language. No gross focal neurologic deficits are appreciated. No facial droop Skin:  Skin is warm, dry and intact. No rash noted. Psychiatric: Mood and affect are normal. Speech and behavior are normal.  ____________________________________________   LABS (all labs ordered are listed, but only abnormal results are displayed)  Results  for orders placed or performed during the hospital encounter of 02/19/18 (from the past 24 hour(s))  Basic metabolic panel     Status: Abnormal   Collection Time: 02/19/18 11:32 AM  Result Value Ref Range   Sodium 135 135 - 145 mmol/L   Potassium 3.7 3.5 - 5.1 mmol/L   Chloride 99 98 - 111 mmol/L   CO2 26 22 - 32 mmol/L   Glucose, Bld 120 (H) 70 - 99 mg/dL   BUN 27 (H) 8 - 23 mg/dL   Creatinine, Ser 0.91 0.61 - 1.24 mg/dL   Calcium 8.7 (L) 8.9 - 10.3 mg/dL   GFR calc non Af Amer >60 >60 mL/min   GFR calc Af Amer >60 >60 mL/min   Anion gap 10 5 - 15  CBC     Status: Abnormal   Collection Time: 02/19/18 11:32 AM  Result Value Ref Range   WBC 8.4 4.0 - 10.5 K/uL   RBC 3.32 (L) 4.22 - 5.81 MIL/uL   Hemoglobin 9.7 (L) 13.0 - 17.0 g/dL   HCT 28.8 (L) 39.0 - 52.0 %   MCV 86.7 80.0 - 100.0 fL   MCH 29.2 26.0 - 34.0 pg   MCHC 33.7 30.0 - 36.0 g/dL   RDW 13.4 11.5 - 15.5 %   Platelets 119 (L) 150 - 400 K/uL   nRBC 0.2 0.0 - 0.2 %  Troponin I - ONCE - STAT     Status: None   Collection Time: 02/19/18 11:32 AM  Result Value Ref Range   Troponin I <0.03 <0.03 ng/mL  Lipase, blood     Status: None   Collection Time: 02/19/18 11:32 AM  Result Value Ref Range   Lipase 23 11 - 51 U/L  Urinalysis, Complete w Microscopic     Status: Abnormal   Collection Time: 02/19/18 11:32 AM  Result Value Ref Range   Color, Urine AMBER (A) YELLOW   APPearance HAZY (A) CLEAR   Specific Gravity, Urine 1.018 1.005 - 1.030   pH 5.0 5.0 - 8.0   Glucose, UA NEGATIVE NEGATIVE mg/dL   Hgb urine dipstick MODERATE (A) NEGATIVE   Bilirubin Urine NEGATIVE NEGATIVE   Ketones, ur 5 (A) NEGATIVE mg/dL   Protein, ur 100 (A) NEGATIVE mg/dL   Nitrite NEGATIVE NEGATIVE   Leukocytes, UA NEGATIVE NEGATIVE   RBC / HPF >50 (H) 0 - 5 RBC/hpf   WBC, UA 11-20 0 - 5 WBC/hpf  Bacteria, UA RARE (A) NONE SEEN   Squamous Epithelial / LPF 0-5 0 - 5   Mucus PRESENT    Hyaline Casts, UA PRESENT    Granular Casts, UA PRESENT     Hepatic function panel     Status: None   Collection Time: 02/19/18 11:32 AM  Result Value Ref Range   Total Protein 6.7 6.5 - 8.1 g/dL   Albumin 3.7 3.5 - 5.0 g/dL   AST 24 15 - 41 U/L   ALT 17 0 - 44 U/L   Alkaline Phosphatase 93 38 - 126 U/L   Total Bilirubin 0.8 0.3 - 1.2 mg/dL   Bilirubin, Direct 0.2 0.0 - 0.2 mg/dL   Indirect Bilirubin 0.6 0.3 - 0.9 mg/dL   ____________________________________________  EKG My review and personal interpretation at Time: 11:29   Indication: weakness  Rate: 90  Rhythm: sinus Axis: normal Other: normal intervals, no stemi ____________________________________________  RADIOLOGY  I personally reviewed all radiographic images ordered to evaluate for the above acute complaints and reviewed radiology reports and findings.  These findings were personally discussed with the patient.  Please see medical record for radiology report.  ____________________________________________   PROCEDURES  Procedure(s) performed:  Procedures    Critical Care performed: no ____________________________________________   INITIAL IMPRESSION / ASSESSMENT AND PLAN / ED COURSE  Pertinent labs & imaging results that were available during my care of the patient were reviewed by me and considered in my medical decision making (see chart for details).   DDX: Malnutrition, dehydration, sepsis, metastatic cancer, evaluation for end-of-life discussion  REX OESTERLE is a 77 y.o. who presents to the ED with symptoms as described above.  Patient afebrile and hemodynamically stable but very ill and frail-appearing.  Does appear to be reaching end of his life.  Family is requesting bolus of IV hydration which I will give via IV.  We will also give IV pain medication.  Will consult social work case management as well as palliative care.  Clinical Course as of Feb 19 1502  Tue Feb 19, 2018  1419 Troponin I - ONCE - STAT [PR]  1440 Patient with improvement in symptoms  after viscous lidocaine.  He is eating and drinking.  Spirits improved.   [PR]    Clinical Course User Index [PR] Merlyn Lot, MD     As part of my medical decision making, I reviewed the following data within the [place] notes reviewed and incorporated, Labs reviewed, notes from prior ED visits.   ____________________________________________   FINAL CLINICAL IMPRESSION(S) / ED DIAGNOSES  Final diagnoses:  Dehydration  Metastatic cancer ([place])  Encounter for end of life care      NEW MEDICATIONS STARTED DURING THIS VISIT:  New Prescriptions   LIDOCAINE (XYLOCAINE) 2 % SOLUTION    Use as directed 15 mLs in the mouth or throat every 4 (four) hours as needed for mouth pain.     Note:  This document was prepared using Dragon voice recognition software and may include unintentional dictation errors.    Merlyn Lot, MD 02/19/18 [phone]

## 2018-02-19 NOTE — Telephone Encounter (Signed)
Received call from wife. She and the patient have been up all night with patient dealing with constipation and loose stools following laxatives. She thinks he is too weak to come to appointments today. Also is concerned about is overall declining condition, reporting not eating, not drinking much, and weakness. Reports that [place] is coming out today in follow up from hospital visit.   Instructed wife that we could reschedule appointment with Dr. Tasia Catchings, perhaps for Thursday and that it may be time for further discussion about goals of care and patient's declining condition. Instructed that I will send a note to Dr. Tasia Catchings and her care team.  Patients wife is in agreement with this plan.

## 2018-02-19 NOTE — ED Notes (Signed)
First Nurse Note: Patient to ED via [facility] with complaint of pain "all over" and anorexia.  Stage IV lung cancer.  VS - 154/82, HR - 88, RR - 16, 98% on RA and CBG 125.  Last intake yesterday PM.

## 2018-02-19 NOTE — Progress Notes (Signed)
New referral for [facility] at home received from [facility] following a Palliative medicine consult. Patient is a 77 year old man with stage IV neuroendocrine tumor, brought to the [facility] ED by his wife for evaluation of weakness, poor oral intake and altered mental status. Patient is s/p 1 cycle of carboplatin/etoposide/Tecentriq treatments, and radiation. He has had 2 admissions in December 12/20-12/22 and 12/23 to 12/28 for intractable neck pain secondary to enlargement of skull base lesion. Patent was seen by Palliative medicine NP Josh Borders and by his oncologist Dr. Tasia Catchings here in the ED today. Patient has declined any further treatment and he and his family have chosen to return home with the support of hospice services.  Patient seen lying on the ED stretcher, eyes closed, wife Joseph Parker and son Joseph Parker at bedside. Writer initiated education regarding hospice services, philosophy and team approach to care with understanding voiced. No DME needs at this time. Plan is to return home via [facility]. Patient did awaken at end of visit and remains agreeable to home with hospice services. Hospital care team updated. Patient information faxed to referral. Hospice information and contact numbers given to Mrs. Cirilo. Thank you for the opportunity to be involved in the care of this patent and his family. Flo Shanks RN, BSN, [place] and [facility] [place], hospital Liaison [phone]

## 2018-02-19 NOTE — Consult Note (Signed)
[place]  [phone] [phone]   Name: Joseph Parker Date: 02/19/2018 MRN: [id number]  DOB: [birth year]  Patient Care Team: Tonia Ghent, MD as PCP - General (Family Medicine) Eustace Moore, MD as Consulting Physician (Neurosurgery) Telford Nab, RN as Registered Nurse    REASON FOR CONSULTATION: [place] consult requested for this 77 y.o. 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  He also has a history of pathologic fracture to right trochanter.  PMH also notable for COPD, CAD, carotid artery disease, and recent oral candidiasis.  Patient has been receiving treatment with carboplatin and etoposide and Tecentriq. He has also received XRT to SI joint/lumbar region. CT of the cervical spine and head revealed metastatic disease to T2 and enlargement of a destructive right skull mass. Patient was admitted for intractable pain and remained hospitalized 02/08/2018 to 02/10/2018 for same. He was again admitted 02/11/18 to 02/16/18 with pain. Patient's pain improved with increased opioids. He was discharged home and presents to the ER on 02/19/18 with weakness and poor oral intake following a week of constipation. Patient received multiple doses of laxatives and an enema last evening prior to having several BMs overnight. Wife is now asking about hospice care. Palliative care was consulted to help address goals.   SOCIAL HISTORY:    Patient is married and lives at home with his wife and a son. He has two sons from a previous marriage. He has three sons by his current wife. Patient retired from [employer] as a Librarian, academic.   ADVANCE DIRECTIVES:  Not on file  CODE STATUS: DNR  PAST MEDICAL HISTORY: Past Medical History:  Diagnosis Date  . Cancer ([place])   . Carotid arterial disease ([employer])   . Carotid  artery disease ([place])   . Chickenpox   . Colon polyps   . COPD (chronic obstructive pulmonary disease) ([place])   . Hyperlipidemia   . Neuroendocrine cancer ([place]) 01/28/2018  . Normal cardiac stress test    06/2013  . Shortness of breath dyspnea   . Urinary frequency   . Urinary hesitancy     PAST SURGICAL HISTORY:  Past Surgical History:  Procedure Laterality Date  . CATARACT EXTRACTION, BILATERAL    . COLONOSCOPY    . ESOPHAGOGASTRODUODENOSCOPY    . HERNIA REPAIR     right inquinal - as child  . LUMBAR LAMINECTOMY/DECOMPRESSION MICRODISCECTOMY Left 10/14/2014   Procedure: Left Lumbar three-four extraforaminla microdiskectomy;  Surgeon: Eustace Moore, MD;  Location: [facility];  Service: Neurosurgery;  Laterality: Left;  Left L34 extraforaminal microdiskectomy  . POLYPECTOMY    . PORTA CATH INSERTION N/A 01/31/2018   Procedure: PORTA CATH INSERTION;  Surgeon: Algernon Huxley, MD;  Location: [facility];  Service: Cardiovascular;  Laterality: N/A;  . TONSILLECTOMY     remote - childhood  . UPPER GASTROINTESTINAL ENDOSCOPY      HEMATOLOGY/ONCOLOGY HISTORY:    Neuroendocrine carcinoma metastatic to multiple sites ([place])   01/28/2018 Initial Diagnosis    Neuroendocrine cancer ([place])    02/05/2018 -  Chemotherapy    The patient had palonosetron (ALOXI) injection 0.25 mg, 0.25 mg, Intravenous,  Once, 1 of 4 cycles Administration: 0.25 mg (02/05/2018) pegfilgrastim-cbqv (UDENYCA) injection 6 mg, 6 mg, Subcutaneous, Once, 1 of 4 cycles Administration: 6 mg (02/08/2018) CARBOplatin (PARAPLATIN) 360 mg in sodium chloride 0.9 % 250  mL chemo infusion, 370 mg (100 % of original dose 374 mg), Intravenous,  Once, 1 of 4 cycles Dose modification:   (original dose 374 mg, Cycle 1) Administration: 360 mg (02/05/2018) etoposide (VEPESID) 170 mg in sodium chloride 0.9 % 500 mL chemo infusion, 100 mg/m2 = 170 mg, Intravenous,  Once, 1 of 4 cycles Administration: 170 mg (02/05/2018), 170 mg  (02/06/2018), 170 mg (02/07/2018) fosaprepitant (EMEND) 150 mg, dexamethasone (DECADRON) 12 mg in sodium chloride 0.9 % 145 mL IVPB, , Intravenous,  Once, 1 of 4 cycles Administration:  (02/05/2018) atezolizumab (TECENTRIQ) 1,200 mg in sodium chloride 0.9 % 250 mL chemo infusion, 1,200 mg, Intravenous, Once, 1 of 8 cycles Administration: 1,200 mg (02/05/2018)  for chemotherapy treatment.      ALLERGIES:  is allergic to penicillins and tessalon [benzonatate].  MEDICATIONS:  Current Facility-Administered Medications  Medication Dose Route Frequency Provider Last Rate Last Dose  . albuterol (PROVENTIL) (2.5 MG/3ML) 0.083% nebulizer solution 2.5 mg  2.5 mg Nebulization Q6H PRN Merlyn Lot, MD      . gabapentin (NEURONTIN) capsule 100 mg  100 mg Oral TID Merlyn Lot, MD      . morphine 4 MG/ML injection 4 mg  4 mg Intravenous Q3H PRN Merlyn Lot, MD      . oxyCODONE (Oxy IR/ROXICODONE) immediate release tablet 5-10 mg  5-10 mg Oral Q3H PRN Merlyn Lot, MD      . prochlorperazine (COMPAZINE) tablet 10 mg  10 mg Oral Q6H PRN Merlyn Lot, MD       Current Outpatient Medications  Medication Sig Dispense Refill  . albuterol (PROVENTIL HFA;VENTOLIN HFA) 108 (90 Base) MCG/ACT inhaler Inhale 2 puffs into the lungs every 6 (six) hours as needed for wheezing or shortness of breath. 1 Inhaler 12  . amitriptyline (ELAVIL) 50 MG tablet Take 1 tablet (50 mg total) by mouth at bedtime. 30 tablet 0  . aspirin EC 81 MG tablet Take 1 tablet (81 mg total) by mouth daily.    Marland Kitchen dexamethasone (DECADRON) 4 MG tablet Take 1 tablet (4 mg total) by mouth daily. 10 tablet 0  . Fluticasone-Salmeterol (ADVAIR DISKUS) 250-50 MCG/DOSE AEPB Inhale 1 puff into the lungs 2 (two) times daily. 60 each prn  . gabapentin (NEURONTIN) 100 MG capsule Take 1 capsule (100 mg total) by mouth 3 (three) times daily. 90 capsule 0  . lactulose (CHRONULAC) 10 GM/15ML solution Take 30 mLs (20 g total) by mouth  daily as needed for mild constipation. 236 mL 0  . lidocaine (XYLOCAINE) 2 % solution Use as directed 15 mLs in the mouth or throat every 4 (four) hours as needed for mouth pain. 200 mL 0  . lidocaine-prilocaine (EMLA) cream Apply to affected area once 30 g 3  . meloxicam (MOBIC) 15 MG tablet Take 1 tablet (15 mg total) by mouth daily. 30 tablet 0  . ondansetron (ZOFRAN) 8 MG tablet Take 1 tablet (8 mg total) by mouth 2 (two) times daily as needed for refractory nausea / vomiting. Start on day 3 after carboplatin chemo. 30 tablet 1  . oxyCODONE (OXY IR/ROXICODONE) 5 MG immediate release tablet Take 1-2 tablets (5-10 mg total) by mouth every 3 (three) hours as needed for moderate pain or breakthrough pain. 30 tablet 0  . oxyCODONE ER (XTAMPZA ER) 18 MG C12A Take 18 mg by mouth every 12 (twelve) hours. 14 each 0  . polyethylene glycol powder (GLYCOLAX/MIRALAX) powder Take 17 g by mouth 2 (two) times daily as needed.    Marland Kitchen  prochlorperazine (COMPAZINE) 10 MG tablet Take 1 tablet (10 mg total) by mouth every 6 (six) hours as needed (Nausea or vomiting). 30 tablet 1  . senna (SENOKOT) 8.6 MG TABS tablet Take 1 tablet (8.6 mg total) by mouth 2 (two) times daily. 120 each 0  . tamsulosin (FLOMAX) 0.4 MG CAPS capsule Take 1 capsule (0.4 mg total) by mouth daily. 30 capsule 0   Facility-Administered Medications Ordered in Other Encounters  Medication Dose Route Frequency Provider Last Rate Last Dose  . HYDROmorphone (DILAUDID) injection 1 mg  1 mg Intravenous Once Borders, Kirt Boys, NP        VITAL SIGNS: BP (!) 146/71   Pulse 82   Temp 98 F (36.7 C) (Oral)   Resp 20   Ht _0  (1.727 m)   Wt 125 lb (56.7 kg)   SpO2 98%   BMI 19.01 kg/m  Filed Weights   02/19/18 1117  Weight: 125 lb (56.7 kg)    Estimated body mass index is 19.01 kg/m as calculated from the following:   Height as of this encounter: _1  (1.727 m).   Weight as of this encounter: 125 lb (56.7 kg).  LABS: CBC:      Component Value Date/Time   WBC 8.4 02/19/2018 1132   HGB 9.7 (L) 02/19/2018 1132   HCT 28.8 (L) 02/19/2018 1132   PLT 119 (L) 02/19/2018 1132   MCV 86.7 02/19/2018 1132   NEUTROABS 1.3 (L) 02/16/2018 0440   LYMPHSABS 0.4 (L) 02/16/2018 0440   MONOABS 0.2 02/16/2018 0440   EOSABS 0.0 02/16/2018 0440   BASOSABS 0.0 02/16/2018 0440   Comprehensive Metabolic Panel:    Component Value Date/Time   NA 135 02/19/2018 1132   K 3.7 02/19/2018 1132   CL 99 02/19/2018 1132   CO2 26 02/19/2018 1132   BUN 27 (H) 02/19/2018 1132   CREATININE 0.91 02/19/2018 1132   GLUCOSE 120 (H) 02/19/2018 1132   CALCIUM 8.7 (L) 02/19/2018 1132   AST 24 02/19/2018 1132   ALT 17 02/19/2018 1132   ALKPHOS 93 02/19/2018 1132   BILITOT 0.8 02/19/2018 1132   PROT 6.7 02/19/2018 1132   ALBUMIN 3.7 02/19/2018 1132    RADIOGRAPHIC STUDIES: Dg Chest 2 View  Result Date: 02/19/2018 CLINICAL DATA:  Shortness of breath. EXAM: CHEST - 2 VIEW COMPARISON:  CT 01/20/2018.  Chest x-ray 01/20/2018. FINDINGS: PowerPort catheter with tip over superior vena cava. Heart size normal. Lungs are clear. No pleural effusion or pneumothorax. Destructive lesion again noted along the left T12 vertebral body, reference is made to prior CT report 01/20/2018. IMPRESSION: 1.  PowerPort catheter with tip over superior vena cava. 2.  No acute pulmonary disease. 3. Destructive lesion again noted the left T12 vertebral body, reference is made to prior CT report 01/20/2018. Electronically Signed   By: Marcello Moores  Register   On: 02/19/2018 11:59   Ct Head Wo Contrast  Result Date: 02/08/2018 CLINICAL DATA:  Acute right neck pain and headache beginning after radiation therapy yesterday. History of poorly differentiated neuroendocrine cancer. EXAM: CT HEAD WITHOUT CONTRAST CT CERVICAL SPINE WITHOUT CONTRAST TECHNIQUE: Multidetector CT imaging of the head and cervical spine was performed following the standard protocol without intravenous contrast.  Multiplanar CT image reconstructions of the cervical spine were also generated. COMPARISON:  Brain MRI 01/09/2018.  PET-CT 01/11/2018. FINDINGS: CT HEAD FINDINGS Brain: There is no evidence of acute infarct, intracranial hemorrhage, mass, midline shift, or extra-axial fluid collection. The ventricles and sulci are  within normal limits for age. Scattered cerebral white matter hypodensities are nonspecific but compatible with mild chronic small vessel ischemic disease. Vascular: Calcified atherosclerosis at the skull base. No hyperdense vessel. Skull: 3.5 cm destructive skull base lesion involving the right occipital condyle, petrous ridge, and clivus, increased in size from the prior MRI. Scattered subcentimeter lesions elsewhere in the skull. Sinuses/Orbits: Visualized paranasal sinuses and mastoid air cells are clear. Bilateral cataract extraction is noted. Other: None. CT CERVICAL SPINE FINDINGS Alignment: Slight right convex curvature of the cervical spine. No significant listhesis. Skull base and vertebrae: Right-sided skull base lesion as above. 4 cm destructive mass involving the left-sided posterior elements at T2. 1 cm lytic lesion in the C7 vertebral body. No acute fracture Soft tissues and spinal canal: Left-sided epidural tumor at T2 without evidence of high-grade spinal stenosis. No prevertebral swelling. Disc levels: Moderate disc space narrowing and degenerative endplate spurring from O2-7 to C7-T1 with moderate to severe multilevel neural foraminal stenosis. Upper chest: Centrilobular emphysema in the lung apices. Other: Carotid artery atherosclerosis. IMPRESSION: 1. No evidence of acute intracranial abnormality. 2. Osseous metastatic disease with significant interval enlargement of a destructive right skull base mass. 3. No acute osseous abnormality identified in the cervical spine. 4. 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 5. Metastasis involving the  T2 posterior elements with left lateral epidural tumor. Electronically Signed   By: Logan Bores M.D.   On: 02/08/2018 13:09   Ct Angio Chest Pe W And/or Wo Contrast  Result Date: 01/20/2018 CLINICAL DATA:  Shortness of breath and left chest pain today. History of lung cancer. EXAM: CT ANGIOGRAPHY CHEST WITH CONTRAST TECHNIQUE: Multidetector CT imaging of the chest was performed using the standard protocol during bolus administration of intravenous contrast. Multiplanar CT image reconstructions and MIPs were obtained to evaluate the vascular anatomy. CONTRAST:  75 mL OMNIPAQUE IOHEXOL 350 MG/ML SOLN COMPARISON:  PET CT scan 01/11/2018. FINDINGS: Cardiovascular: Satisfactory opacification of the pulmonary arteries to the segmental level. No evidence of pulmonary embolism. Normal heart size. No pericardial effusion. Mediastinum/Nodes: Again seen is a mass lesion centered in the mediastinum on the left which measures 5.4 x 3.9 cm on image 43, unchanged. As noted on the prior examination, the lesion is centered in the AP window. It abuts the right main pulmonary artery without invading it. The mass also surrounds the interlobar branch to the anterior left upper lobe. No right hilar lymphadenopathy is identified. The esophagus and thyroid gland appear normal. Lungs/Pleura: The patient has a small left pleural effusion which is new since the prior exam. The lungs demonstrate extensive centrilobular emphysema. A left upper lobe nodule on image 42 measuring 0.7 cm is unchanged. There also a few scattered ground-glass attenuation opacities in the anterior left upper lobe which are unchanged. The right lung is clear. Upper Abdomen: No acute abnormality. Cyst in the right kidney noted. Musculoskeletal: Destructive lesion in the left T2 transverse process is identified and measures approximately 3.5 x 2.0 cm on image 11 of series 7, not notably changed. Also seen is a large destructive lesion in the left side of the T12  vertebral body extending into the left pedicle, unchanged. No new bony abnormality is identified. Review of the MIP images confirms the above findings. IMPRESSION: Negative for pulmonary embolus or acute cardiopulmonary disease. Small left pleural effusion is new since the prior exam. No change in a left mediastinal mass lesion with metastatic deposits in the left side of T12  and left transverse process of T2. Aortic Atherosclerosis (ICD10-I70.0) and Severe emphysema (ICD10-J43.9). Electronically Signed   By: Inge Rise M.D.   On: 01/20/2018 16:40   Ct Cervical Spine Wo Contrast  Result Date: 02/08/2018 CLINICAL DATA:  Acute right neck pain and headache beginning after radiation therapy yesterday. History of poorly differentiated neuroendocrine cancer. EXAM: CT HEAD WITHOUT CONTRAST CT CERVICAL SPINE WITHOUT CONTRAST TECHNIQUE: Multidetector CT imaging of the head and cervical spine was performed following the standard protocol without intravenous contrast. Multiplanar CT image reconstructions of the cervical spine were also generated. COMPARISON:  Brain MRI 01/09/2018.  PET-CT 01/11/2018. FINDINGS: CT HEAD FINDINGS Brain: There is no evidence of acute infarct, intracranial hemorrhage, mass, midline shift, or extra-axial fluid collection. The ventricles and sulci are within normal limits for age. Scattered cerebral white matter hypodensities are nonspecific but compatible with mild chronic small vessel ischemic disease. Vascular: Calcified atherosclerosis at the skull base. No hyperdense vessel. Skull: 3.5 cm destructive skull base lesion involving the right occipital condyle, petrous ridge, and clivus, increased in size from the prior MRI. Scattered subcentimeter lesions elsewhere in the skull. Sinuses/Orbits: Visualized paranasal sinuses and mastoid air cells are clear. Bilateral cataract extraction is noted. Other: None. CT CERVICAL SPINE FINDINGS Alignment: Slight right convex curvature of the  cervical spine. No significant listhesis. Skull base and vertebrae: Right-sided skull base lesion as above. 4 cm destructive mass involving the left-sided posterior elements at T2. 1 cm lytic lesion in the C7 vertebral body. No acute fracture Soft tissues and spinal canal: Left-sided epidural tumor at T2 without evidence of high-grade spinal stenosis. No prevertebral swelling. Disc levels: Moderate disc space narrowing and degenerative endplate spurring from X1-1 to C7-T1 with moderate to severe multilevel neural foraminal stenosis. Upper chest: Centrilobular emphysema in the lung apices. Other: Carotid artery atherosclerosis. IMPRESSION: 1. No evidence of acute intracranial abnormality. 2. Osseous metastatic disease with significant interval enlargement of a destructive right skull base mass. 3. No acute osseous abnormality identified in the cervical spine. 4. 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 5. Metastasis involving the T2 posterior elements with left lateral epidural tumor. Electronically Signed   By: Logan Bores M.D.   On: 02/08/2018 13:09   US Renal  Result Date: 02/15/2018 CLINICAL DATA:  Urinary incontinence EXAM: RENAL / URINARY TRACT ULTRASOUND COMPLETE COMPARISON:  CT abdomen pelvis of 12/28/2017 FINDINGS: Right Kidney: Renal measurements: 12.6 x 5.3 x 5.6 cm. = volume: 194 mL. There is moderate right hydronephrosis present, new since the CT of 12/28/2016. Renal cysts are present primarily in the upper pole of 2.2 and 1.5 cm in diameter respectively. No solid renal lesion is seen. The echogenicity of the renal parenchyma may be slightly increased and correlation with renal laboratory values is recommended. Left Kidney: Renal measurements: 11.6 x 6.3 x 4.9 cm. = volume: One hundred ninety-one mL. Moderate hydronephrosis also noted involving the left kidney. Renal cysts are present of 2.8 and 2.5 cm in diameter respectively. The echogenicity of the  left kidney may be slightly increased. Bladder: The urinary bladder is well distended. There does appear to be some prominence of the prostate gland. However the soft tissue noted in the posterior inferior aspect of the urinary bladder could be due to either indentation by prominent median lobe of the prostate versus a separate bladder carcinoma. I did review the recent CT and it is difficult to determine which may be the cause. This area within the posterior inferior aspect  of the urinary bladder measures 4.5 x 2.2 x 2.4 cm. Prevoid urine volume of 924 cubic cm is measured. The patient could not void however for postvoid volume to be obtained. IMPRESSION: 1. Moderate bilateral hydronephrosis, new since the CT of 12/28/2017. 2. Soft tissue structure in the posterior inferior aspect of the urinary bladder which could represent either a primary urinary bladder carcinoma or possibly indentation by a prominent median lobe of the prostate. It is difficult to distinguish between the two entities on this ultrasound or prior CT of the abdomen pelvis. 3. Questionable slightly echogenic renal parenchyma bilaterally. 4. Bilateral renal cysts.  No solid renal lesion is seen. Electronically Signed   By: Ivar Drape M.D.   On: 02/15/2018 10:25   Ct Biopsy  Result Date: 01/22/2018 INDICATION: Lytic bone lesions and lung mass EXAM: CT BIOPSY MEDICATIONS: None. ANESTHESIA/SEDATION: Fentanyl 100 mcg IV; Versed 1 mg IV Moderate Sedation Time:  6 minutes The patient was continuously monitored during the procedure by the interventional radiology nurse under my direct supervision. FLUOROSCOPY TIME:  Fluoroscopy Time:  minutes  seconds ( mGy). COMPLICATIONS: None immediate. PROCEDURE: Informed written consent was obtained from the patient after a thorough discussion of the procedural risks, benefits and alternatives. All questions were addressed. Maximal Sterile Barrier Technique was utilized including caps, mask, sterile gowns,  sterile gloves, sterile drape, hand hygiene and skin antiseptic. A timeout was performed prior to the initiation of the procedure. Under CT guidance, a(n) 17 gauge guide needle was advanced into the right iliac bone lesion. Subsequently, 3 18 gauge core biopsies were obtained. The guide needle was removed. Post biopsy images demonstrate no hemorrhage. Patient tolerated the procedure well without complication. Vital sign monitoring by nursing staff during the procedure will continue as patient is in the special procedures unit for post procedure observation. FINDINGS: The images document guide needle placement within the right iliac bone lesion. Post biopsy images demonstrate no hemorrhage. IMPRESSION: Successful CT-guided core biopsy of a right iliac bone lesion. Electronically Signed   By: Marybelle Killings M.D.   On: 01/22/2018 12:48    PERFORMANCE STATUS (ECOG) : 2 - Symptomatic, <50% confined to bed  Review of Systems As noted above. Otherwise, a complete review of systems is negative.  Physical Exam General: NAD, frail appearing, thin HEENT: Bilateral temporal wasting Cardiovascular: regular rate and rhythm Pulmonary: clear ant fields Abdomen: soft, nontender, + bowel sounds Extremities: no edema Skin: no rashes Neurological: Weakness but otherwise nonfocal  IMPRESSION: I met with patient's wife and son in the ER.  Wife says that she was able to get patient had a bowel movement after multiple doses of laxatives and an enema.  She is concerned that patient has had poor oral intake over the past week.  However, that is likely in the setting of constipation.  Constipation is almost certainly opioid induced.  He will benefit from a more aggressive laxative regimen given the need for continued opioids.  Pain is reportedly stable on opioid regimen.  Wife says that patient has been weaker and generally has declined over the past few weeks.  She again asks about hospice care, which we had previously  discussed in detail.  She says that patient has told her several times that he is suffering and wants to involve hospice.  Patient is certainly a candidate for hospice services if he and family opt not to pursue further treatment.  However, I think it is important that patient and family first speak with Dr.  Tasia Catchings about the plan of care.  Case discussed with ER attending.  Oncology consult ordered.  I have discussed the case with Dr. Tasia Catchings who will also see the patient in the ER.  Case discussed with care management.  I spoke with wife about CODE STATUS.  She feels confident that patient would not want to be resuscitated or have his life prolonged artificially.  She says that patient and family want him to be a DNR.  DNR order was signed and explained to wife in detail.  PLAN: 1.  Bowel regimen to include MiraLax BID, senna BID prn (up to 4 tablets BID), and dulcolax PR prn 2.  Future use of a peripheral opioid antagonist could be considered if above bowel regimen fails 3.  Continue OxyContin 62m Q12H - may increase to 225mQ8H if needed (this was the dose he was requiring during the previous hospitalization)  4.  Continue oxycodone 5-1058m3H prn for BTP 5.  Continue gabapentin, dexamethasone, and meloxicam 6.  DNR order signed 7.  Oncology consult   Patient expressed understanding and was in agreement with this plan. He also understands that He can call clinic at any time with any questions, concerns, or complaints.    Time Total: 45 minutes  Visit consisted of counseling and education dealing with the complex and emotionally intense issues of symptom management and palliative care in the setting of serious and potentially life-threatening illness.Greater than 50%  of this time was spent counseling and coordinating care related to the above assessment and plan.  Signed by: JosAltha HarmhD, NP-C [phone] Cell)

## 2018-02-19 NOTE — Telephone Encounter (Signed)
Per Epic notes patient went to ER today and is still there. Patient has several appointments scheduled with oncologist. Not sure if a follow up with PCP is needed at this time. Will send a note for Dr. Damita Dunnings to review. There are telephone notes from oncologist and their input in the chart too.

## 2018-02-20 ENCOUNTER — Telehealth: Payer: Self-pay | Admitting: Family Medicine

## 2018-02-20 NOTE — Telephone Encounter (Addendum)
Please call patient's wife.  My understanding is the patient was seen at the hospital and was discharged home with plan for hospice care at home.  I hate to hear that he has been having so much trouble in the first place.  I can still be the attending (if this isn't already addressed by oncology) but I would like hospice help.  Please let me know if I can do anything else in the meantime.  I have been thinking about the patient and his wife.  Thanks.

## 2018-02-20 NOTE — Telephone Encounter (Signed)
Please see other phone note.  I typed that up prior to seeing this note.  Thanks.

## 2018-02-20 NOTE — Telephone Encounter (Signed)
I saw that patient was going home on hospice.  Please talk to me and Joseph Parker about this prior to calling patient back.  He may not need OV here at this point.

## 2018-02-21 ENCOUNTER — Ambulatory Visit: Payer: Medicare Other

## 2018-02-21 ENCOUNTER — Other Ambulatory Visit: Payer: Self-pay | Admitting: Oncology

## 2018-02-21 ENCOUNTER — Other Ambulatory Visit: Payer: Self-pay | Admitting: *Deleted

## 2018-02-21 DIAGNOSIS — R52 Pain, unspecified: Secondary | ICD-10-CM

## 2018-02-21 MED ORDER — OXYCODONE ER 18 MG PO C12A: 18.0000 mg | EXTENDED_RELEASE_CAPSULE | Freq: Two times a day (BID) | ORAL | 0 refills | Status: DC

## 2018-02-21 MED ORDER — OXYCODONE HCL 5 MG PO TABS: 5.0000 mg | ORAL_TABLET | ORAL | 0 refills | Status: DC | PRN

## 2018-02-21 MED ORDER — LORAZEPAM 0.5 MG PO TABS: 0.5000 mg | ORAL_TABLET | ORAL | 0 refills | Status: AC | PRN

## 2018-02-21 NOTE — Progress Notes (Signed)
Hospice RN Lattie Haw called and reports patient is very anxious.  E prescribed Ativan 0.5mg  Q4 hours as needed for anxiety.

## 2018-02-21 NOTE — Telephone Encounter (Signed)
Spoke with patient's wife who says the Hospice Nurse came in today for the assessment and it is her understanding that the Oncology MD will be assisting along with Hospice.  Wife was very appreciative of Dr. Damita Dunnings and everyone here at the clinic and she says that Mr. Kundert speaks highly of Dr. Damita Dunnings to everyone.  I expressed our concern for them and asked the wife to let us know if we can do anything to help.

## 2018-02-21 NOTE — Telephone Encounter (Signed)
See other message in the chart.

## 2018-02-22 ENCOUNTER — Other Ambulatory Visit: Payer: Self-pay | Admitting: *Deleted

## 2018-02-22 ENCOUNTER — Other Ambulatory Visit: Payer: Self-pay | Admitting: Hospice and Palliative Medicine

## 2018-02-22 ENCOUNTER — Ambulatory Visit: Payer: Medicare Other

## 2018-02-22 MED ORDER — MORPHINE SULFATE ER 15 MG PO TBCR: 15.0000 mg | EXTENDED_RELEASE_TABLET | Freq: Two times a day (BID) | ORAL | 0 refills | Status: DC

## 2018-02-22 NOTE — Addendum Note (Signed)
Addended by: Betti Cruz on: 02/22/2018 04:13 PM   Modules accepted: Orders

## 2018-02-22 NOTE — Telephone Encounter (Signed)
Noted.  Thanks.  I appreciate the help of all involved.

## 2018-02-22 NOTE — Addendum Note (Signed)
Addended by: Betti Cruz on: 02/22/2018 03:29 PM   Modules accepted: Orders

## 2018-02-22 NOTE — Progress Notes (Signed)
Hospice requested switching to MS Contin from [place] due to cost. Dosing reflects a reduction of ~50% to account for incomplete cross tolerance. However, patient may need rapid dose escalation or increase frequency to Q8H dosing as he has had two recent hospitalizations due to intractable pain.

## 2018-02-22 NOTE — Telephone Encounter (Signed)
Needs to change Xtampza to MS ER 15 twice a day please approve, She will call pharmacy to cancel the [facility] prescription sent yesterday

## 2018-02-24 ENCOUNTER — Other Ambulatory Visit: Payer: Self-pay | Admitting: Oncology

## 2018-02-25 ENCOUNTER — Ambulatory Visit: Payer: Medicare Other

## 2018-02-26 ENCOUNTER — Ambulatory Visit: Payer: Medicare Other

## 2018-02-26 ENCOUNTER — Ambulatory Visit: Payer: Medicare Other | Admitting: Oncology

## 2018-02-26 ENCOUNTER — Other Ambulatory Visit: Payer: Medicare Other

## 2018-02-27 ENCOUNTER — Ambulatory Visit: Payer: Medicare Other

## 2018-02-28 ENCOUNTER — Ambulatory Visit: Payer: Self-pay | Admitting: Urology

## 2018-02-28 ENCOUNTER — Ambulatory Visit: Payer: Medicare Other

## 2018-03-01 ENCOUNTER — Ambulatory Visit: Payer: Medicare Other

## 2018-03-04 ENCOUNTER — Ambulatory Visit: Payer: Medicare Other

## 2018-03-05 ENCOUNTER — Ambulatory Visit: Payer: Medicare Other

## 2018-03-09 ENCOUNTER — Other Ambulatory Visit: Payer: Self-pay | Admitting: Oncology

## 2018-03-09 MED ORDER — MORPHINE SULFATE ER 15 MG PO TBCR: 15.0000 mg | EXTENDED_RELEASE_TABLET | Freq: Two times a day (BID) | ORAL | 0 refills | Status: DC

## 2018-03-10 ENCOUNTER — Other Ambulatory Visit: Payer: Self-pay | Admitting: Internal Medicine

## 2018-03-14 ENCOUNTER — Other Ambulatory Visit: Payer: Self-pay | Admitting: Hospice and Palliative Medicine

## 2018-03-14 ENCOUNTER — Telehealth: Payer: Self-pay | Admitting: *Deleted

## 2018-03-14 MED ORDER — LIDOCAINE 5 % EX PTCH: 1.0000 | MEDICATED_PATCH | CUTANEOUS | 0 refills | Status: AC

## 2018-03-14 NOTE — Telephone Encounter (Signed)
Lidoderm patches ordered.

## 2018-03-14 NOTE — Progress Notes (Signed)
Lidoderm patches ordered for pain

## 2018-03-14 NOTE — Telephone Encounter (Signed)
Hospice nurse called requesting order for Lidocaine patches for patienst back as that is the only thing in the past that has helped him with this. Please advise

## 2018-03-24 ENCOUNTER — Telehealth: Payer: Self-pay | Admitting: Family Medicine

## 2018-03-24 NOTE — Telephone Encounter (Signed)
Please check with patient's wife about how they are both doing.  Please let me know if there is anything I can do for either one of them.  Thanks.

## 2018-03-25 ENCOUNTER — Other Ambulatory Visit: Payer: Self-pay | Admitting: Oncology

## 2018-03-25 ENCOUNTER — Other Ambulatory Visit: Payer: Self-pay | Admitting: *Deleted

## 2018-03-25 MED ORDER — MORPHINE SULFATE ER 15 MG PO TBCR: 15.0000 mg | EXTENDED_RELEASE_TABLET | Freq: Two times a day (BID) | ORAL | 0 refills | Status: DC

## 2018-03-25 NOTE — Telephone Encounter (Signed)
Patient's wife says they have been doing pretty good until this morning when the patient got up and was having a hard time getting his breath.  Wife gave him a nebulizer treatment and called the Hospice nurse who gave him Lorazepam and he has settled down now.  His BP was 73/60.  Patient has been eating good but not gaining weight.  His current weight is 112 lb.  Wife appreciates the call and says to tell Dr. Damita Dunnings that Joseph Parker praises him a lot for being a good doctor.

## 2018-03-25 NOTE — Telephone Encounter (Signed)
Contacted [name]-left msg that script was filled.

## 2018-03-25 NOTE — Telephone Encounter (Signed)
Incoming call from [place] with hospice [phone].  Patient needs RF on morphine 15 mg tablet. Pt takes twice daily. He will run out tonight. [facility] uses [facility] [address].

## 2018-03-26 NOTE — Telephone Encounter (Signed)
Noted.  Thanks.  I appreciate the help of all involved.

## 2018-04-08 ENCOUNTER — Other Ambulatory Visit: Payer: Self-pay | Admitting: *Deleted

## 2018-04-08 MED ORDER — MORPHINE SULFATE ER 15 MG PO TBCR: 15.0000 mg | EXTENDED_RELEASE_TABLET | Freq: Two times a day (BID) | ORAL | 0 refills | Status: AC

## 2018-04-09 ENCOUNTER — Other Ambulatory Visit: Payer: Self-pay | Admitting: *Deleted

## 2018-04-09 DIAGNOSIS — R52 Pain, unspecified: Secondary | ICD-10-CM

## 2018-04-09 MED ORDER — OXYCODONE HCL 5 MG PO TABS: 5.0000 mg | ORAL_TABLET | ORAL | 0 refills | Status: AC | PRN

## 2018-04-11 ENCOUNTER — Other Ambulatory Visit: Payer: Self-pay | Admitting: *Deleted

## 2018-04-12 DIAGNOSIS — I10 Essential (primary) hypertension: Secondary | ICD-10-CM | POA: Diagnosis not present

## 2018-04-12 DIAGNOSIS — R279 Unspecified lack of coordination: Secondary | ICD-10-CM | POA: Diagnosis not present

## 2018-04-12 DIAGNOSIS — Z743 Need for continuous supervision: Secondary | ICD-10-CM | POA: Diagnosis not present

## 2018-04-12 MED ORDER — MORPHINE SULFATE (CONCENTRATE) 20 MG/ML PO SOLN: ORAL | 0 refills | Status: AC

## 2018-04-21 DEATH — deceased

## 2018-04-22 ENCOUNTER — Encounter: Payer: Self-pay | Admitting: Oncology

## 2018-04-22 ENCOUNTER — Telehealth: Payer: Self-pay | Admitting: Family Medicine

## 2018-04-22 NOTE — Telephone Encounter (Signed)
Joseph Parker   Called to let you know pt passed 05/04/18

## 2018-04-23 ENCOUNTER — Telehealth: Payer: Self-pay | Admitting: Family Medicine

## 2018-04-23 NOTE — Telephone Encounter (Signed)
Late entry.  Called his wife 04/22/2018.  Condolences offered.  She thanked me for the call.  I was always glad to see this gentleman in clinic.  He was a kind man and well respected in the community.  He will be missed.

## 2018-04-25 ENCOUNTER — Encounter: Payer: Medicare Other | Admitting: Family Medicine

## 2018-04-25 ENCOUNTER — Ambulatory Visit: Payer: Medicare Other

## 2018-05-30 ENCOUNTER — Telehealth: Payer: Self-pay | Admitting: Family Medicine

## 2018-05-30 NOTE — Telephone Encounter (Signed)
Spouse called stating she needs to get a [employer] form stating pt had cancer.  Joseph Parker has called [facility] to get this information and has not gotten any help at all she has called 3 times.  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

## 2018-06-03 NOTE — Telephone Encounter (Signed)
Joseph Hawking, do you know what type of form she is asking for or could you guide her to the right person at [facility] to get it. I have never heard of this.

## 2018-06-05 NOTE — Telephone Encounter (Signed)
Do you have any idea how this is handled? I have left a msg with the spouse but I thought I'd check with you in the meantime.

## 2018-06-05 NOTE — Telephone Encounter (Signed)
I don't have the form but sometimes a patient/family will need a form/letter done re: a diagnosis for insurance.  Please tell her that I absolutely will try to do whatever paperwork she needs done.  Let me know and I'll work on it.  Thanks.

## 2018-06-17 NOTE — Telephone Encounter (Signed)
I have left 3 messages over the past few weeks. If Joseph Parker calls back please forward to me. I need more info to see if I can help.

## 2020-06-07 IMAGING — CT CT BIOPSY
1 of 2 series · 15 of 32 positions shown, 19 images · non-contrast
Comparison: none

INDICATION: Lytic bone lesions and lung mass

[Series 2: i-spiral 5.0 b30f · axial · 0.67mm/px · z∈[+1097,+1209]mm · 15 of 36 slices shown, 19 images]
[im 2/36  soft-tissue]
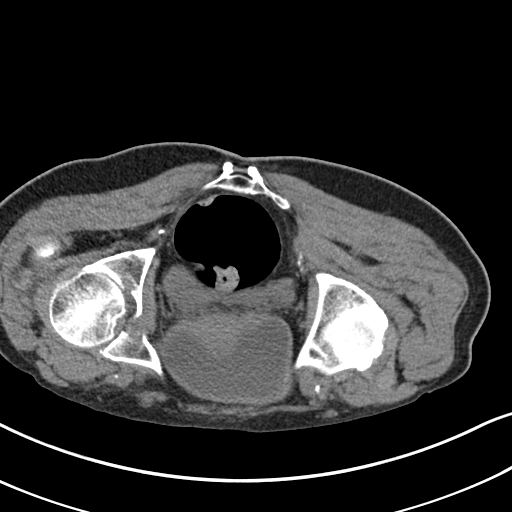
[im 2/36  bone]
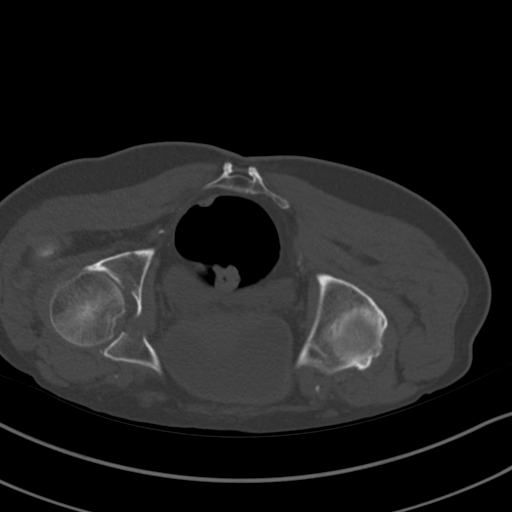
[im 5/36  soft-tissue]
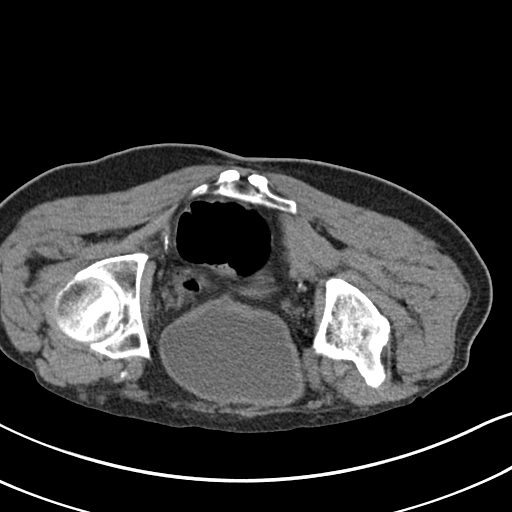
[im 8/36  soft-tissue]
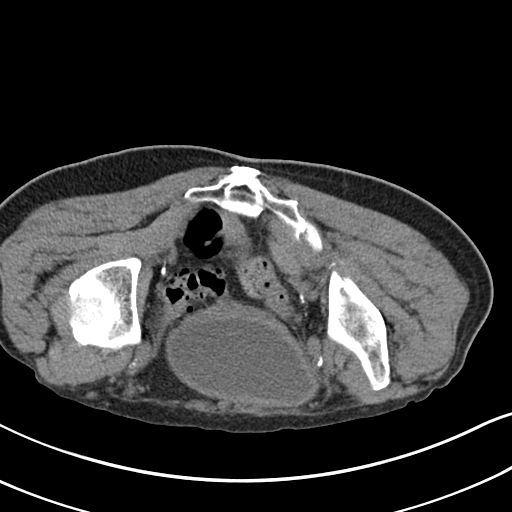
[im 10/36  soft-tissue]
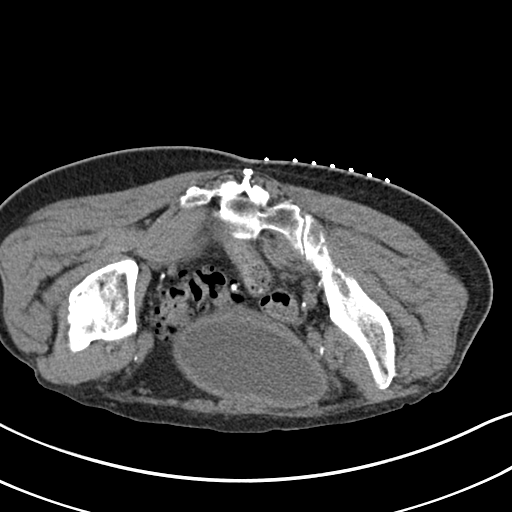
[im 13/36  soft-tissue]
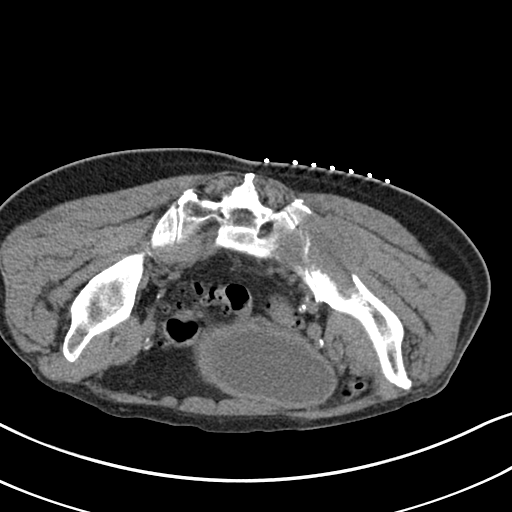
[im 16/36  soft-tissue]
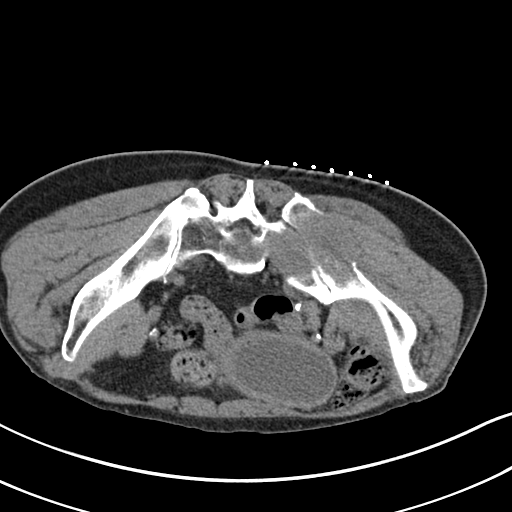
[im 19/36  soft-tissue]
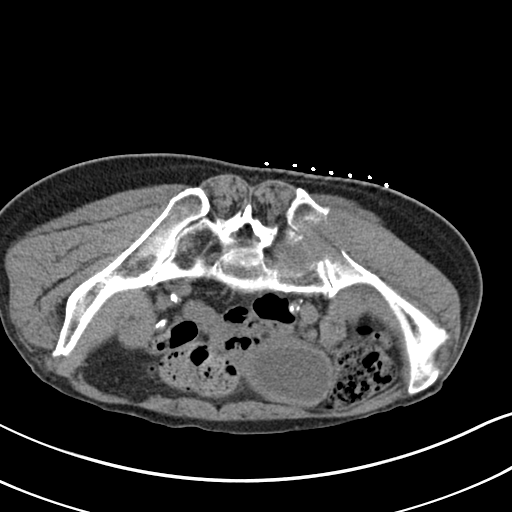
[im 20/36  soft-tissue]
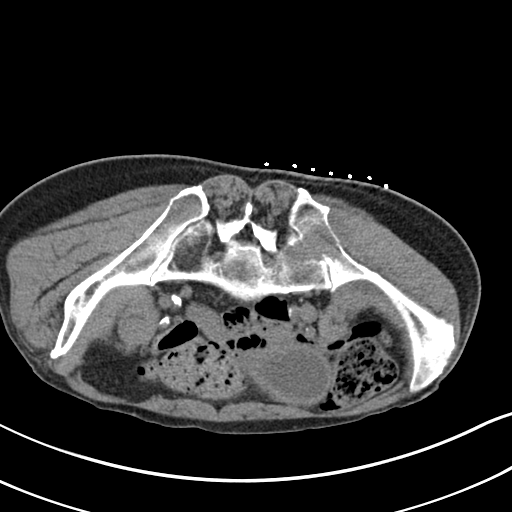
[im 23/36  soft-tissue]
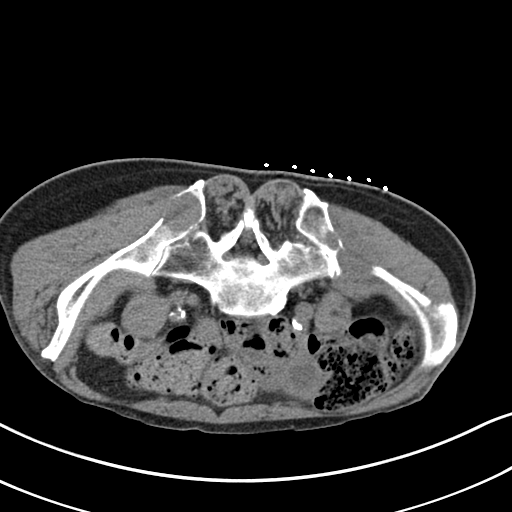
[im 23/36  bone]
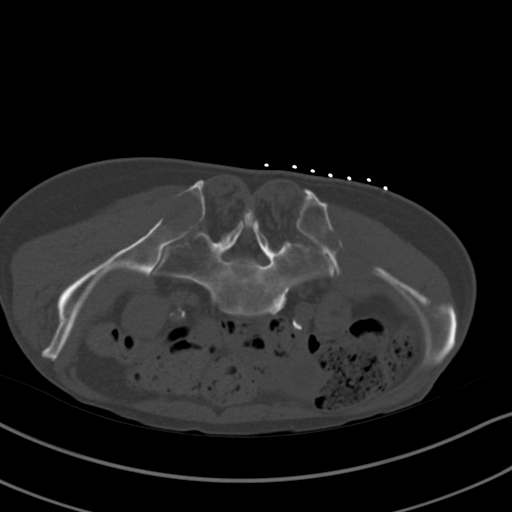
[im 26/36  soft-tissue]
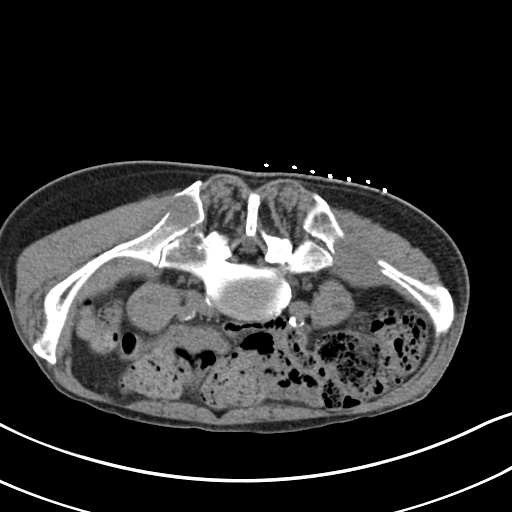
[im 29/36  soft-tissue]
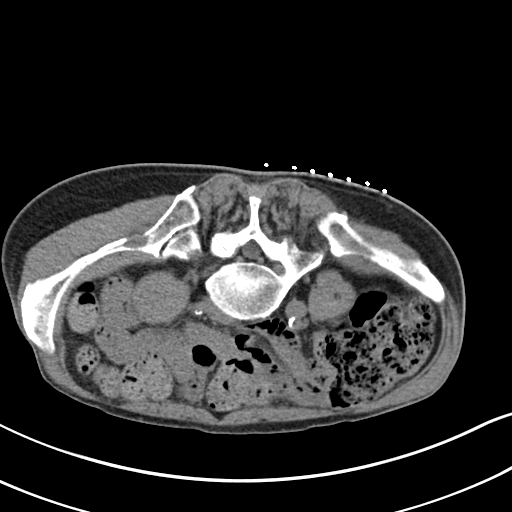
[im 30/36  lung]
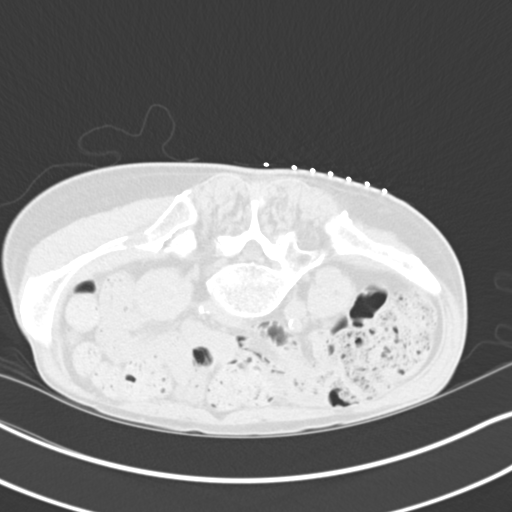
[im 31/36  soft-tissue]
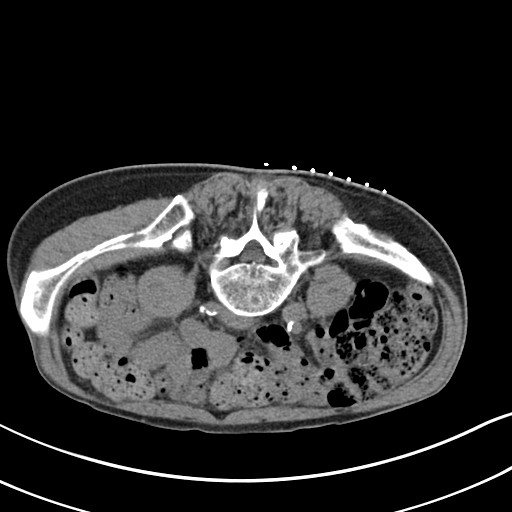
[im 31/36  lung]
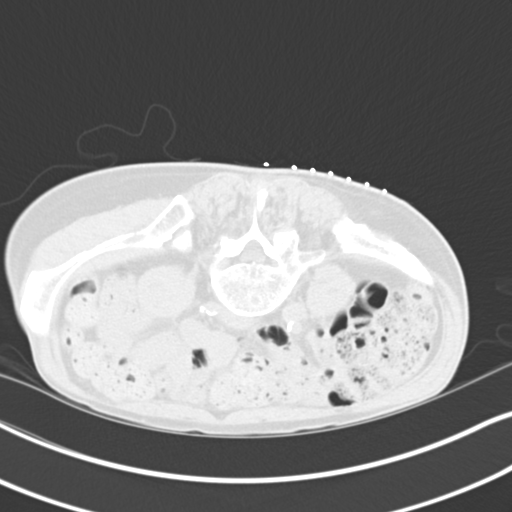
[im 33/36  lung]
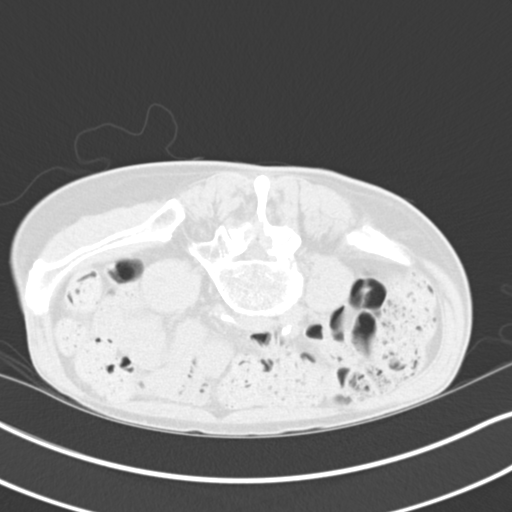
[im 34/36  soft-tissue]
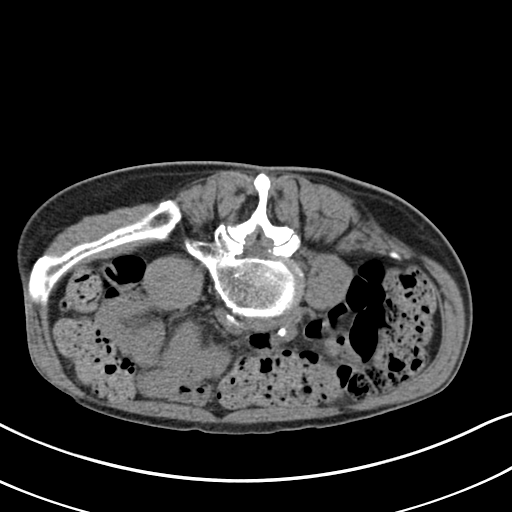
[im 34/36  lung]
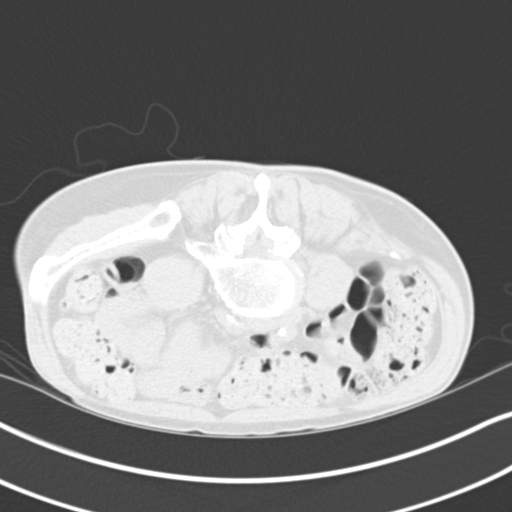

[15 of 32 positions shown; findings below may reference images not displayed]

EXAM:
CT BIOPSY

MEDICATIONS:
None.

ANESTHESIA/SEDATION:
Fentanyl 100 mcg IV; Versed 1 mg IV

Moderate Sedation Time:  6 minutes

The patient was continuously monitored during the procedure by the
interventional radiology nurse under my direct supervision.

FLUOROSCOPY TIME:  Fluoroscopy Time:  minutes  seconds ( mGy).

COMPLICATIONS:
None immediate.

PROCEDURE:
Informed written consent was obtained from the patient after a
thorough discussion of the procedural risks, benefits and
alternatives. All questions were addressed. Maximal Sterile Barrier
Technique was utilized including caps, mask, sterile gowns, sterile
gloves, sterile drape, hand hygiene and skin antiseptic. A timeout
was performed prior to the initiation of the procedure.

Under CT guidance, a(n) 17 gauge guide needle was advanced into the
right iliac bone lesion. Subsequently, 3 18 gauge core biopsies were
obtained. The guide needle was removed. Post biopsy images
demonstrate no hemorrhage.

Patient tolerated the procedure well without complication. Vital
sign monitoring by nursing staff during the procedure will continue
as patient is in the special procedures unit for post procedure
observation.
FINDINGS: The images document guide needle placement within the right iliac
bone lesion. Post biopsy images demonstrate no hemorrhage.
IMPRESSION: Successful CT-guided core biopsy of a right iliac bone lesion.
